# Patient Record
Sex: Male | Born: 1955 | Race: White | Hispanic: No | Marital: Married | Smoking: Never smoker
Health system: Southern US, Community
[De-identification: ages and names within clinical notes are randomized; demographics above are authoritative.]

## PROBLEM LIST (undated history)

## (undated) DIAGNOSIS — I2510 Atherosclerotic heart disease of native coronary artery without angina pectoris: Secondary | ICD-10-CM

## (undated) DIAGNOSIS — I480 Paroxysmal atrial fibrillation: Secondary | ICD-10-CM

## (undated) DIAGNOSIS — E785 Hyperlipidemia, unspecified: Secondary | ICD-10-CM

## (undated) DIAGNOSIS — M1990 Unspecified osteoarthritis, unspecified site: Secondary | ICD-10-CM

## (undated) DIAGNOSIS — K219 Gastro-esophageal reflux disease without esophagitis: Secondary | ICD-10-CM

## (undated) DIAGNOSIS — G4730 Sleep apnea, unspecified: Secondary | ICD-10-CM

## (undated) HISTORY — DX: Paroxysmal atrial fibrillation: I48.0

## (undated) HISTORY — DX: Atherosclerotic heart disease of native coronary artery without angina pectoris: I25.10

## (undated) HISTORY — DX: Sleep apnea, unspecified: G47.30

## (undated) HISTORY — DX: Unspecified osteoarthritis, unspecified site: M19.90

## (undated) HISTORY — DX: Gastro-esophageal reflux disease without esophagitis: K21.9

---

## 2016-01-01 DIAGNOSIS — R0789 Other chest pain: Secondary | ICD-10-CM

## 2016-01-01 DIAGNOSIS — I10 Essential (primary) hypertension: Secondary | ICD-10-CM

## 2016-01-01 DIAGNOSIS — Z8249 Family history of ischemic heart disease and other diseases of the circulatory system: Secondary | ICD-10-CM

## 2019-05-07 ENCOUNTER — Ambulatory Visit (INDEPENDENT_AMBULATORY_CARE_PROVIDER_SITE_OTHER): Payer: 59 | Admitting: Cardiology

## 2019-05-07 ENCOUNTER — Encounter: Payer: Self-pay | Admitting: Cardiology

## 2019-05-07 ENCOUNTER — Other Ambulatory Visit: Payer: Self-pay

## 2019-05-07 ENCOUNTER — Telehealth: Payer: Self-pay | Admitting: Cardiology

## 2019-05-07 VITALS — BP 140/98 | HR 61 | Ht 69.0 in | Wt 166.0 lb

## 2019-05-07 DIAGNOSIS — I10 Essential (primary) hypertension: Secondary | ICD-10-CM

## 2019-05-07 DIAGNOSIS — R55 Syncope and collapse: Secondary | ICD-10-CM

## 2019-05-07 DIAGNOSIS — R079 Chest pain, unspecified: Secondary | ICD-10-CM

## 2019-05-07 DIAGNOSIS — I998 Other disorder of circulatory system: Secondary | ICD-10-CM

## 2019-05-07 DIAGNOSIS — F329 Major depressive disorder, single episode, unspecified: Secondary | ICD-10-CM

## 2019-05-07 DIAGNOSIS — R0789 Other chest pain: Secondary | ICD-10-CM

## 2019-05-07 HISTORY — DX: Chest pain, unspecified: R07.9

## 2019-05-07 HISTORY — DX: Essential (primary) hypertension: I10

## 2019-05-07 NOTE — Telephone Encounter (Signed)
New Message:     Please call Lyman Bishop from [facility] ER. It is concerning Juan Payne.

## 2019-05-07 NOTE — Progress Notes (Signed)
Cardiology Office Note:    Date:  05/07/2019   ID:  MELQUAN ERNSBERGER, DOB August 27, 1955, MRN [id number]  PCP:  Street, Stephanie Coup, MD  Cardiologist:  Thomasene Ripple, DO  Electrophysiologist:  None   Referring MD: Street, Stephanie Coup, *   Chief Complaint  Patient presents with  . New Patient (Initial Visit)    History of Present Illness:    Juan Payne is a 64 y.o. male with a hx of hypertension, significant family history of premature coronary artery disease, dyslipidemia not on any statin due to statin intolerance presents today to be evaluated for chest pain and uncontrolled hypertension.  Patient tells me that over the last several months he has been experiencing significant chest tightness which at first he described as a midsternal tightness that radiates diffusely across his chest.  Recently he states that he experiences these midsternal intermittent chest tightness which lasts for few seconds and then goes leftward and radiates of his shoulder and down his arms.  Of recent he has been experiencing some significant lightheaded and dizzy sensation with this.  He tells me that he had an episode last weekend which prompted his visit to his primary care doctor recently.  During our visit he tells me that he was experiencing chest pain, and then fell back as he noted that the shortness of breath as well as dizziness with the near could be feeling.   History reviewed. No pertinent past medical history.  History reviewed. No pertinent surgical history.  Current Medications: Current Meds  Medication Sig  . aspirin EC 81 MG tablet Take 81 mg by mouth daily.  . carvedilol (COREG) 6.25 MG tablet Take 6.25 mg by mouth 2 (two) times daily.  . citalopram (CELEXA) 20 MG tablet Take 20 mg by mouth daily.  . cloNIDine (CATAPRES) 0.1 MG tablet Take 0.1 mg by mouth 2 (two) times daily.  . hydrOXYzine (ATARAX/VISTARIL) 25 MG tablet Take 25 mg by mouth 3 (three) times daily.  Marland Kitchen  losartan-hydrochlorothiazide (HYZAAR) 100-25 MG tablet Take 1 tablet by mouth daily.     Allergies:   Patient has no known allergies.   Social History   Socioeconomic History  . Marital status: Married    Spouse name: Not on file  . Number of children: Not on file  . Years of education: Not on file  . Highest education level: Not on file  Occupational History  . Not on file  Tobacco Use  . Smoking status: Never Smoker  . Smokeless tobacco: Never Used  Substance and Sexual Activity  . Alcohol use: Not Currently  . Drug use: Never  . Sexual activity: Not on file  Other Topics Concern  . Not on file  Social History Narrative  . Not on file   Social Determinants of Health   Financial Resource Strain:   . Difficulty of Paying Living Expenses:   Food Insecurity:   . Worried About Programme researcher, broadcasting/film/video in the Last Year:   . Barista in the Last Year:   Transportation Needs:   . Freight forwarder (Medical):   Marland Kitchen Lack of Transportation (Non-Medical):   Physical Activity:   . Days of Exercise per Week:   . Minutes of Exercise per Session:   Stress:   . Feeling of Stress :   Social Connections:   . Frequency of Communication with Friends and Family:   . Frequency of Social Gatherings with Friends and Family:   . Attends  Religious Services:   . Active Member of Clubs or Organizations:   . Attends Banker Meetings:   Marland Kitchen Marital Status:      Family History: The patient's family history includes Heart attack in his brother and father; Hypertension in his mother.  ROS:   Review of Systems  Constitution: Negative for decreased appetite, fever and weight gain.  HENT: Negative for congestion, ear discharge, hoarse voice and sore throat.   Eyes: Negative for discharge, redness, vision loss in right eye and visual halos.  Cardiovascular: Negative for chest pain, dyspnea on exertion, leg swelling, orthopnea and palpitations.  Respiratory: Negative for cough,  hemoptysis, shortness of breath and snoring.   Endocrine: Negative for heat intolerance and polyphagia.  Hematologic/Lymphatic: Negative for bleeding problem. Does not bruise/bleed easily.  Skin: Negative for flushing, nail changes, rash and suspicious lesions.  Musculoskeletal: Negative for arthritis, joint pain, muscle cramps, myalgias, neck pain and stiffness.  Gastrointestinal: Negative for abdominal pain, bowel incontinence, diarrhea and excessive appetite.  Genitourinary: Negative for decreased libido, genital sores and incomplete emptying.  Neurological: Negative for brief paralysis, focal weakness, headaches and loss of balance.  Psychiatric/Behavioral: Negative for altered mental status, depression and suicidal ideas.  Allergic/Immunologic: Negative for HIV exposure and persistent infections.    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  Echocardiogram done in December 2017 reports mild left ventricular concentric hypertrophy.  EF 61%.  Diastolic filling pattern indicates impaired relaxation.  Right ventricle is normal in size and function.  Left atrium is normal in size.  The right atrium is normal in size and function.  There is mild lipomatous hypertrophy of the interatrial septum.  No evidence of interatrial communication by color flow Doppler.  The aortic valve is trileaflet.  With mild aortic valve sclerosis good mobility.  No evidence of aortic stenosis.  Trace mitral vegetation.  Tricuspid valve is normal.  Trace tricuspid regurgitation.  Pulmonic valve is normal.  The aortic root, ascending aorta and aortic arch are all normal.  IVC is normal with normal inspiratory collapse.  No pericardial fusion.  Exercise treadmill  stress test at [facility] in 2017 was reported to be normal as he achieved 10 METS.  Recent Labs: No results found for requested labs within last 8760 hours.  Recent Lipid Panel No results found for: CHOL, TRIG, HDL, CHOLHDL, VLDL, LDLCALC, LDLDIRECT  Physical Exam:    VS:  BP (!) 140/98   Pulse 61   Ht 5\' 9"  (1.753 m)   Wt 166 lb (75.3 kg)   SpO2 99%   BMI 24.51 kg/m     Wt Readings from Last 3 Encounters:  05/07/19 166 lb (75.3 kg)     GEN: Well nourished, well developed in no acute distress HEENT: Normal NECK: No JVD; No carotid bruits LYMPHATICS: No lymphadenopathy CARDIAC: S1S2 noted,RRR, no murmurs, rubs, gallops RESPIRATORY:  Clear to auscultation without rales, wheezing or rhonchi  ABDOMEN: Soft, non-tender, non-distended, +bowel sounds, no guarding. EXTREMITIES: No edema, No cyanosis, no clubbing MUSCULOSKELETAL:  No deformity  SKIN: Warm and dry NEUROLOGIC:  Alert and oriented x 3, non-focal PSYCHIATRIC:  Normal affect, good insight  ASSESSMENT:    1. Chest pain   2. Essential hypertension    PLAN:  1.  Chest pain-the patient is currently experiencing chest pain along with significant lightheadedness and dizziness therefore we are going to call EMS transfer the [facility].  I am going to call the ED physician to discuss the case with him.  2.  He is hypertensive I like to start the patient on Aldactone 12.5 mg daily, his target blood pressure should be less than 130/80.  3 hyperlipidemia-the patient is not taking any statin medication at this time.  The patient is in agreement with the above plan. The patient left the office in stable condition.  The patient will follow up in 1 month   Medication Adjustments/Labs and Tests Ordered: Current medicines are reviewed at length with the patient today.  Concerns regarding medicines are outlined above.  No orders of the defined types were placed in this encounter.  No orders of the  defined types were placed in this encounter.   There are no Patient Instructions on file for this visit.   Adopting a Healthy Lifestyle.  Know what a healthy weight is for you (roughly BMI <25) and aim to maintain this   Aim for 7+ servings of fruits and vegetables daily   65-80+ fluid ounces of water or unsweet tea for healthy kidneys   Limit to max 1 drink of alcohol per day; avoid smoking/tobacco   Limit animal fats in diet for cholesterol and heart health - choose grass fed whenever available   Avoid highly processed foods, and foods high in saturated/trans fats   Aim for low stress - take time to unwind and care for your mental health   Aim for 150 min of moderate intensity exercise weekly for heart health, and weights twice weekly for bone health   Aim for 7-9 hours of sleep daily   When it comes to diets, agreement about the perfect plan isnt easy to find, even among the experts. Experts at the [employer] developed an idea known as the Healthy Eating Plate. Just imagine a plate divided into logical, healthy portions.   The emphasis is on diet quality:   Load up on vegetables and fruits - one-half of your plate: Aim for color and variety, and remember that potatoes dont count.   Go for whole grains - one-quarter of your plate: Whole wheat, barley, wheat berries, quinoa, oats, brown rice, and foods made with them. If you want pasta, go with whole wheat pasta.   Protein power - one-quarter of your plate: Fish, chicken, beans, and nuts are all healthy, versatile protein sources. Limit red meat.   The diet, however, does go beyond the plate, offering a few other suggestions.   Use healthy plant oils, such as olive, canola, soy, corn, sunflower and peanut. Check the labels, and avoid partially hydrogenated oil, which have unhealthy trans fats.   If youre thirsty, drink water. Coffee and tea are good in moderation, but skip sugary drinks and limit milk  and dairy products to one or two daily servings.   The type of carbohydrate in the diet is more important than the amount. Some sources of carbohydrates, such as vegetables, fruits, whole grains, and beans-are healthier than others.   Finally, stay active  Signed, Berniece Salines, DO  05/07/2019 9:42 AM    [place]

## 2019-05-07 NOTE — Telephone Encounter (Signed)
I spoke with the ED provider at [facility].

## 2019-05-08 DIAGNOSIS — R55 Syncope and collapse: Secondary | ICD-10-CM | POA: Diagnosis not present

## 2019-05-08 DIAGNOSIS — F329 Major depressive disorder, single episode, unspecified: Secondary | ICD-10-CM | POA: Diagnosis not present

## 2019-05-08 DIAGNOSIS — R079 Chest pain, unspecified: Secondary | ICD-10-CM | POA: Diagnosis not present

## 2019-05-08 DIAGNOSIS — I998 Other disorder of circulatory system: Secondary | ICD-10-CM | POA: Diagnosis not present

## 2019-05-08 DIAGNOSIS — R0789 Other chest pain: Secondary | ICD-10-CM | POA: Diagnosis not present

## 2019-05-09 DIAGNOSIS — F329 Major depressive disorder, single episode, unspecified: Secondary | ICD-10-CM | POA: Diagnosis not present

## 2019-05-09 DIAGNOSIS — R0789 Other chest pain: Secondary | ICD-10-CM | POA: Diagnosis not present

## 2019-05-09 DIAGNOSIS — I998 Other disorder of circulatory system: Secondary | ICD-10-CM | POA: Diagnosis not present

## 2019-05-09 DIAGNOSIS — R55 Syncope and collapse: Secondary | ICD-10-CM | POA: Diagnosis not present

## 2019-05-10 DIAGNOSIS — R55 Syncope and collapse: Secondary | ICD-10-CM | POA: Diagnosis not present

## 2019-05-10 DIAGNOSIS — R0789 Other chest pain: Secondary | ICD-10-CM | POA: Diagnosis not present

## 2019-05-10 DIAGNOSIS — F329 Major depressive disorder, single episode, unspecified: Secondary | ICD-10-CM | POA: Diagnosis not present

## 2019-05-10 DIAGNOSIS — I998 Other disorder of circulatory system: Secondary | ICD-10-CM | POA: Diagnosis not present

## 2019-05-11 ENCOUNTER — Inpatient Hospital Stay (HOSPITAL_COMMUNITY)
Admission: AD | Admit: 2019-05-11 | Discharge: 2019-05-19 | DRG: 234 | Disposition: A | Discharge status: Home / Self Care | Payer: 59 | Source: Other Acute Inpatient Hospital | Attending: Cardiothoracic Surgery | Admitting: Cardiothoracic Surgery

## 2019-05-11 ENCOUNTER — Encounter (HOSPITAL_COMMUNITY): Payer: Self-pay | Admitting: Family Medicine

## 2019-05-11 ENCOUNTER — Other Ambulatory Visit: Payer: Self-pay

## 2019-05-11 DIAGNOSIS — I25110 Atherosclerotic heart disease of native coronary artery with unstable angina pectoris: Secondary | ICD-10-CM | POA: Diagnosis present

## 2019-05-11 DIAGNOSIS — E782 Mixed hyperlipidemia: Secondary | ICD-10-CM | POA: Diagnosis not present

## 2019-05-11 DIAGNOSIS — I10 Essential (primary) hypertension: Secondary | ICD-10-CM | POA: Diagnosis not present

## 2019-05-11 DIAGNOSIS — Z01810 Encounter for preprocedural cardiovascular examination: Secondary | ICD-10-CM

## 2019-05-11 DIAGNOSIS — Z951 Presence of aortocoronary bypass graft: Secondary | ICD-10-CM

## 2019-05-11 DIAGNOSIS — F419 Anxiety disorder, unspecified: Secondary | ICD-10-CM | POA: Diagnosis present

## 2019-05-11 DIAGNOSIS — R55 Syncope and collapse: Secondary | ICD-10-CM

## 2019-05-11 DIAGNOSIS — J90 Pleural effusion, not elsewhere classified: Secondary | ICD-10-CM

## 2019-05-11 DIAGNOSIS — J939 Pneumothorax, unspecified: Secondary | ICD-10-CM

## 2019-05-11 DIAGNOSIS — E785 Hyperlipidemia, unspecified: Secondary | ICD-10-CM | POA: Diagnosis present

## 2019-05-11 DIAGNOSIS — Z531 Procedure and treatment not carried out because of patient's decision for reasons of belief and group pressure: Secondary | ICD-10-CM | POA: Diagnosis not present

## 2019-05-11 DIAGNOSIS — R079 Chest pain, unspecified: Secondary | ICD-10-CM | POA: Diagnosis not present

## 2019-05-11 DIAGNOSIS — I480 Paroxysmal atrial fibrillation: Secondary | ICD-10-CM

## 2019-05-11 DIAGNOSIS — I200 Unstable angina: Secondary | ICD-10-CM

## 2019-05-11 DIAGNOSIS — I2510 Atherosclerotic heart disease of native coronary artery without angina pectoris: Principal | ICD-10-CM

## 2019-05-11 DIAGNOSIS — F329 Major depressive disorder, single episode, unspecified: Secondary | ICD-10-CM | POA: Diagnosis present

## 2019-05-11 DIAGNOSIS — I5032 Chronic diastolic (congestive) heart failure: Secondary | ICD-10-CM | POA: Diagnosis present

## 2019-05-11 DIAGNOSIS — I110 Hypertensive heart disease with heart failure: Secondary | ICD-10-CM | POA: Diagnosis present

## 2019-05-11 DIAGNOSIS — Z20822 Contact with and (suspected) exposure to covid-19: Secondary | ICD-10-CM | POA: Diagnosis present

## 2019-05-11 DIAGNOSIS — I4891 Unspecified atrial fibrillation: Secondary | ICD-10-CM | POA: Diagnosis not present

## 2019-05-11 DIAGNOSIS — IMO0001 Reserved for inherently not codable concepts without codable children: Secondary | ICD-10-CM

## 2019-05-11 DIAGNOSIS — Z7982 Long term (current) use of aspirin: Secondary | ICD-10-CM

## 2019-05-11 DIAGNOSIS — I340 Nonrheumatic mitral (valve) insufficiency: Secondary | ICD-10-CM | POA: Diagnosis not present

## 2019-05-11 DIAGNOSIS — Z79899 Other long term (current) drug therapy: Secondary | ICD-10-CM

## 2019-05-11 DIAGNOSIS — Z8249 Family history of ischemic heart disease and other diseases of the circulatory system: Secondary | ICD-10-CM

## 2019-05-11 DIAGNOSIS — D62 Acute posthemorrhagic anemia: Secondary | ICD-10-CM | POA: Diagnosis not present

## 2019-05-11 HISTORY — DX: Unstable angina: I20.0

## 2019-05-11 HISTORY — DX: Procedure and treatment not carried out because of patient's decision for reasons of belief and group pressure: Z53.1

## 2019-05-11 HISTORY — DX: Syncope and collapse: R55

## 2019-05-11 HISTORY — DX: Hyperlipidemia, unspecified: E78.5

## 2019-05-11 HISTORY — DX: Reserved for inherently not codable concepts without codable children: IMO0001

## 2019-05-11 LAB — BASIC METABOLIC PANEL
Anion gap: 13 (ref 5–15)
BUN: 16 mg/dL (ref 8–23)
CO2: 24 mmol/L (ref 22–32)
Calcium: 9.6 mg/dL (ref 8.9–10.3)
Chloride: 101 mmol/L (ref 98–111)
Creatinine, Ser: 0.83 mg/dL (ref 0.61–1.24)
GFR calc Af Amer: >60 mL/min (ref >60)
GFR calc non Af Amer: >60 mL/min (ref >60)
Glucose, Bld: 103 mg/dL — ABNORMAL HIGH (ref 70–99)
Potassium: 3.8 mmol/L (ref 3.5–5.1)
Sodium: 138 mmol/L (ref 135–145)

## 2019-05-11 LAB — CBC
HCT: 44.7 % (ref 39.0–52.0)
Hemoglobin: 15.6 g/dL (ref 13.0–17.0)
MCH: 32.3 pg (ref 26.0–34.0)
MCHC: 34.9 g/dL (ref 30.0–36.0)
MCV: 92.5 fL (ref 80.0–100.0)
Platelets: 248 10*3/uL (ref 150–400)
RBC: 4.83 MIL/uL (ref 4.22–5.81)
RDW: 11.6 % (ref 11.5–15.5)
WBC: 6.9 10*3/uL (ref 4.0–10.5)
nRBC: 0 % (ref 0.0–0.2)

## 2019-05-11 LAB — HEPATIC FUNCTION PANEL
ALT: 86 U/L — ABNORMAL HIGH (ref 0–44)
AST: 60 U/L — ABNORMAL HIGH (ref 15–41)
Albumin: 4.1 g/dL (ref 3.5–5.0)
Alkaline Phosphatase: 40 U/L (ref 38–126)
Bilirubin, Direct: <0.1 mg/dL (ref 0.0–0.2)
Total Bilirubin: 0.7 mg/dL (ref 0.3–1.2)
Total Protein: 7 g/dL (ref 6.5–8.1)

## 2019-05-11 LAB — MRSA PCR SCREENING: MRSA by PCR: NEGATIVE

## 2019-05-11 LAB — HEPARIN LEVEL (UNFRACTIONATED)
Heparin Unfractionated: 0.75 IU/mL — ABNORMAL HIGH (ref 0.30–0.70)
Heparin Unfractionated: 0.5 IU/mL (ref 0.30–0.70)

## 2019-05-11 LAB — MAGNESIUM: Magnesium: 1.9 mg/dL (ref 1.7–2.4)

## 2019-05-11 LAB — HIV ANTIBODY (ROUTINE TESTING W REFLEX): HIV Screen 4th Generation wRfx: NONREACTIVE

## 2019-05-11 MED ORDER — ONDANSETRON HCL 4 MG/2ML IJ SOLN: 4.0000 mg | Freq: Four times a day (QID) | INTRAMUSCULAR | Status: DC | PRN

## 2019-05-11 MED ORDER — ASPIRIN EC 81 MG PO TBEC
81.0000 mg | DELAYED_RELEASE_TABLET | Freq: Every day | ORAL | Status: DC
  Administered 2019-05-12: 81 mg via ORAL
  Filled 2019-05-11 (×2): qty 1

## 2019-05-11 MED ORDER — ACETAMINOPHEN 325 MG PO TABS: 650.0000 mg | ORAL_TABLET | Freq: Four times a day (QID) | ORAL | Status: DC | PRN

## 2019-05-11 MED ORDER — CARVEDILOL 6.25 MG PO TABS
6.2500 mg | ORAL_TABLET | Freq: Two times a day (BID) | ORAL | Status: DC
  Administered 2019-05-11 (×2): 6.25 mg via ORAL
  Filled 2019-05-11 (×2): qty 1

## 2019-05-11 MED ORDER — ATORVASTATIN CALCIUM 40 MG PO TABS
40.0000 mg | ORAL_TABLET | Freq: Every day | ORAL | Status: DC
  Administered 2019-05-11 – 2019-05-18 (×7): 40 mg via ORAL
  Filled 2019-05-11 (×7): qty 1

## 2019-05-11 MED ORDER — ACETAMINOPHEN 325 MG PO TABS: 650.0000 mg | ORAL_TABLET | ORAL | Status: DC | PRN

## 2019-05-11 MED ORDER — HEPARIN (PORCINE) 25000 UT/250ML-% IV SOLN
1000.0000 [IU]/h | INTRAVENOUS | Status: DC
  Administered 2019-05-11 – 2019-05-12 (×3): 1000 [IU]/h via INTRAVENOUS
  Filled 2019-05-11 (×2): qty 250

## 2019-05-11 MED ORDER — LOSARTAN POTASSIUM 50 MG PO TABS
100.0000 mg | ORAL_TABLET | Freq: Every day | ORAL | Status: DC
  Administered 2019-05-11 – 2019-05-12 (×2): 100 mg via ORAL
  Filled 2019-05-11 (×3): qty 2

## 2019-05-11 MED ORDER — SODIUM CHLORIDE 0.9 % IV SOLN: INTRAVENOUS | Status: DC

## 2019-05-11 NOTE — H&P (Addendum)
History and Physical    Juan Payne:[id number] DOB: 27-Jul-1955 DOA: 05/11/2019  PCP: Venetia Maxon, Sharon Mt, MD   Patient coming from: Home   Chief Complaint: Chest pain, lightheadedness   HPI: Juan Payne is a 64 y.o. male with medical history significant for hypertension and hyperlipidemia, presenting to [facility] emergency department at the direction of his cardiologist for evaluation of chest pain and near syncope.  Patient had been experiencing a vague "tightness" and pressure across his chest intermittently for 3 weeks or so, had a particularly severe episode on 05/03/2019, and occurrences both while at rest and with activity.  He has not noticed any shortness of breath and has not had nausea per se but experiences some lightheadedness and general malaise with some of these episodes.  He has not lost consciousness, but felt as though he might at one point.  There has not been any fevers, chills, leg swelling or tenderness, or cough associated with this.  He denies any headache, change in vision or hearing, or focal numbness or weakness.  Patient saw cardiologist in the clinic on 05/07/2019 for evaluation of the symptoms and was directed to the ED for further evaluation.  [facility] ED and hospital course: Upon arrival to the ED, patient is found to be afebrile, saturating mid 90s, and with stable blood pressure.  EKG featured sinus rhythm with LAD, LVH, and nonspecific IVCD.  Chest x-ray is negative for acute cardiopulmonary disease.  CTA chest was negative for pulmonary embolism or other acute finding.  Chemistry panel and CBC were unremarkable, BNP was normal, and troponin was undetectable.  Head CT was notable for subtle hypodensity at the left frontal white matter.  Patient was given full dose aspirin in the ED.  He was given 1 sublingual nitroglycerin which dropped his systolic blood pressure to less than 70, recovering after a liter of saline.  Patient was admitted  to the hospitalist service, continued to have episodes of chest pain, was started on IV heparin infusion, and cardiology was consulted.  He underwent echocardiogram with normal EF, no regional wall motion abnormalities.  He had a stress test with Lexi scan that was concerning for a small area of mild reversibility involving the distal lateral wall and mid inferolateral wall with normal LV wall motion and normal LVEF.  Cardiologist at [facility] recommended transfer to [facility] for consideration of cardiac catheterization.   Head CT abnormality was follow-up with MRI brain at [facility] that was negative for acute intracranial abnormality.   Review of Systems:  All other systems reviewed and apart from HPI, are negative.  Past Medical History:  Diagnosis Date  . Essential hypertension 05/07/2019  . Hyperlipidemia   . Refusal of blood transfusions as patient is Jehovah's Witness 05/11/2019    History reviewed. No pertinent surgical history.   reports that he has never smoked. He has never used smokeless tobacco. He reports previous alcohol use. He reports that he does not use drugs.  Allergies  Allergen Reactions  . Morphine And Related Other (See Comments)    Drops BP     Family History  Problem Relation Age of Onset  . Hypertension Mother   . Heart attack Father   . Heart attack Brother      Prior to Admission medications   Medication Sig Start Date End Date Taking? Authorizing Provider  aspirin EC 81 MG tablet Take 81 mg by mouth daily.    [provider]  carvedilol (COREG) 6.25  MG tablet Take 6.25 mg by mouth 2 (two) times daily. 05/03/19   [provider]  citalopram (CELEXA) 20 MG tablet Take 20 mg by mouth daily. 05/03/19   [provider]  cloNIDine (CATAPRES) 0.1 MG tablet Take 0.1 mg by mouth 2 (two) times daily. 05/06/19   [provider]  hydrOXYzine (ATARAX/VISTARIL) 25 MG tablet Take 25 mg by mouth 3 (three) times daily. 05/03/19    [provider]  losartan-hydrochlorothiazide (HYZAAR) 100-25 MG tablet Take 1 tablet by mouth daily. 05/03/19   [provider]    Physical Exam: Vitals:   05/11/19 0035  BP: 122/79  Pulse: 60  Resp: 16  Temp: 98.7 F (37.1 C)  TempSrc: Oral  SpO2: 96%  Weight: 73.6 kg  Height: 5\' 9"  (1.753 m)    Constitutional: NAD, calm  Eyes: PERTLA, lids and conjunctivae normal ENMT: Mucous membranes are moist. Posterior pharynx clear of any exudate or lesions.   Neck: normal, supple, no masses, no thyromegaly Respiratory: no wheezing, no crackles. No accessory muscle use.  Cardiovascular: S1 & S2 heard, regular rate and rhythm. No extremity edema.   Abdomen: No distension, no tenderness, soft. Bowel sounds active.  Musculoskeletal: no clubbing / cyanosis. No joint deformity upper and lower extremities.   Skin: no significant rashes, lesions, ulcers. Warm, dry, well-perfused. Neurologic: no facial asymmetry. Sensation intact. Moving all extremities.  Psychiatric: Alert and oriented to person, place, and situation. Pleasant and cooperative.    Labs and Imaging on Admission: I have personally reviewed following labs and imaging studies  CBC: No results for input(s): WBC, NEUTROABS, HGB, HCT, MCV, PLT in the last 168 hours. Basic Metabolic Panel: No results for input(s): NA, K, CL, CO2, GLUCOSE, BUN, CREATININE, CALCIUM, MG, PHOS in the last 168 hours. GFR: CrCl cannot be calculated (No successful lab value found.). Liver Function Tests: No results for input(s): AST, ALT, ALKPHOS, BILITOT, PROT, ALBUMIN in the last 168 hours. No results for input(s): LIPASE, AMYLASE in the last 168 hours. No results for input(s): AMMONIA in the last 168 hours. Coagulation Profile: No results for input(s): INR, PROTIME in the last 168 hours. Cardiac Enzymes: No results for input(s): CKTOTAL, CKMB, CKMBINDEX, TROPONINI in the last 168 hours. BNP (last 3 results) No results for  input(s): PROBNP in the last 8760 hours. HbA1C: No results for input(s): HGBA1C in the last 72 hours. CBG: No results for input(s): GLUCAP in the last 168 hours. Lipid Profile: No results for input(s): CHOL, HDL, LDLCALC, TRIG, CHOLHDL, LDLDIRECT in the last 72 hours. Thyroid Function Tests: No results for input(s): TSH, T4TOTAL, FREET4, T3FREE, THYROIDAB in the last 72 hours. Anemia Panel: No results for input(s): VITAMINB12, FOLATE, FERRITIN, TIBC, IRON, RETICCTPCT in the last 72 hours. Urine analysis: No results found for: COLORURINE, APPEARANCEUR, LABSPEC, PHURINE, GLUCOSEU, HGBUR, BILIRUBINUR, KETONESUR, PROTEINUR, UROBILINOGEN, NITRITE, LEUKOCYTESUR Sepsis Labs: @LABRCNTIP (procalcitonin:4,lacticidven:4) )No results found for this or any previous visit (from the past 240 hour(s)).   Radiological Exams on Admission: No results found.  EKG: Independently reviewed. Sinus rhythm, LAD, LVH, non-specific IVCD.   Assessment/Plan   1. Unstable angina  - Presented to [facility] ED on 4/27 with ~3 wks of intermittent chest pain, becoming more severe, had undetectable troponin, no acute ischemic features on EKG, no PE or other acute finding on CTA chest, echo with preserved EF and no wall-motion abnormality, and Lexiscan with small area of mild reversibility  - He was started on IV heparin at [facility], Lipitor was started,  beta-blocker and ARB continued, and ASA 81 continued  - Cardiologist at [place] recommended transfer to [facility] for consideration of cardiac cath  - Continue IV heparin for now, continue ASA 81, continue statin, continue beta-blocker and ARB   2. Near syncope  - CTA in ED was negative of PE; echo from 05/08/19 with normal EF, no WMA, mild AV sclerosis, and trace-mild MR  - Check orthostatic vitals, continue cardiac monitoring    3. Hypertension  - BP at goal, continue Coreg and losartan     DVT prophylaxis: IV heparin  Code Status: Full  Family Communication:  Discussed with patient  Disposition Plan:  Patient is from: Home  Anticipated d/c is to: Home  Anticipated d/c date is: 05/13/19 Patient currently: Pending cardiology evaluation  Consults called: None  Admission status: Observation     Briscoe Deutscher, MD [facility] Pager: See [email or web address]  If 7AM-7PM, please contact the daytime attending [email or web address]  05/11/2019, 3:50 AM

## 2019-05-11 NOTE — Progress Notes (Signed)
Juan Payne is a 64 y.o. male with medical history significant for hypertension and hyperlipidemia, presenting to [facility] emergency department at the direction of his cardiologist for evaluation of chest pain and near syncope.  Due to concern for ACS was started on IV heparin at [facility].  His cardiologist there recommended transfer to [facility] for possible heart cath.  05/11/19: Seen and examined at his bedside.  States he started feeling better after heparin drip was started.  At the time of this visit patient denies any chest pain, palpitations, dyspnea, dizziness, or GI symptoms.  Twelve-lead EKG done this morning showed: Normal sinus rhythm, left anterior fascicular block.  Cardiology has been consulted.  Please refer to H&P dictated by my partner Dr. Antionette Char  On 05/11/2019 for further details of the assessment and plan.

## 2019-05-11 NOTE — Progress Notes (Signed)
ANTICOAGULATION CONSULT NOTE  Pharmacy Consult for Heparin Indication: chest pain/ACS  Allergies  Allergen Reactions  . Morphine And Related Other (See Comments)    Drops BP     Patient Measurements: Height: 5\' 9"  (175.3 cm) Weight: 73.6 kg (162 lb 4.1 oz) IBW/kg (Calculated) : 70.7 Heparin Dosing Weight: TBW  Vital Signs: Temp: 98.4 F (36.9 C) (05/01 1121) Temp Source: Oral (05/01 1121) BP: 131/88 (05/01 1121) Pulse Rate: 60 (05/01 1121)  Labs: Recent Labs    05/11/19 0454 05/11/19 1223  HGB 15.6  --   HCT 44.7  --   PLT 248  --   HEPARINUNFRC 0.75* 0.50  CREATININE 0.83  --     Estimated Creatinine Clearance: 89.9 mL/min (by C-G formula based on SCr of 0.83 mg/dL).   Medical History: Past Medical History:  Diagnosis Date  . Essential hypertension 05/07/2019  . Hyperlipidemia   . Refusal of blood transfusions as patient is Jehovah's Witness 05/11/2019    Medications:  . heparin 1,000 Units/hr (05/11/19 1116)     Assessment: 64 y.o. M transferred from [place] to [facility] for ACS workup. Pt started on heparin gtt (4000 unit bolus and 1100 units/hr) at [place] - started 4/30 ~1300.  Labs at [place]:  Hgb 17, Hct 49.9, Plt 264, SCr 0.8  Heparin level this afternoon within goal range, no bleeding or complications noted.  CBC, platelet count fairly stable.  Goal of Therapy:  Heparin level 0.3-0.7 units/ml Monitor platelets by anticoagulation protocol: Yes   Plan:  Continue heparin gtt at 1000 units/hr Daily heparin level and CBC F/u plans for cath on Monday.  Friday, [facility] Clinical Pharmacist Phone [phone]  05/11/2019 2:15 PM

## 2019-05-11 NOTE — Consult Note (Addendum)
Cardiology Consult    Patient ID: Juan Payne; [id number]; 1955/05/19   Admit date: 05/11/2019 Date of Consult: 05/11/2019  Primary Care Provider: Street, Stephanie Coup, MD Primary Cardiologist: Thomasene Ripple, DO   Patient Profile    Juan Payne is a 64 y.o. male with past medical history of HTN, HLD (intolerant to statins), Jehovah's witness and family history of premature CAD who is being seen today for the evaluation of chest pain at the request of Dr. Margo Aye.   History of Present Illness    Juan Payne was recently examined by Dr. Servando Salina on 05/07/2019 as a new patient referral for chest pain as he reported having intermittent episodes of chest pain for the past few months which would radiate into his left shoulder and last for seconds at a time. He reported active chest pain at the time of his visit along with dizziness, therefore EMS was called and he was transported to the ED.   Test results from [facility] are reviewed and initial labs showed WBC 6.1, Hgb 17.0, platelets 264, Na+ 140, K+ 4.5 and creatinine 0.80. Troponin values were negative. CTA showed no evidence of a PE but was noted to have coronary artery calcification. CT Head showed subtle hypodensity within the left frontal white matter which may reflect microvascular ischemic changes with no evidence of intracranial hemorrhage or large territory infarction. MRI Head showed no acute intracranial abnormalities but was noted to have chronic small vessel disease. Echocardiogram on 4/27 showed a preserved EF of 55-60% with impaired relaxation. No regional WMA. He did have mild AS and trace to mild MR. Underwent a Lexiscan Myoview on 05/08/2019 and imaging showed a small area of mild reversibility involving the distal lateral wall and mild inferolateral wall with EF at 62%. Was overall read as a low-risk study but given the presenting symptoms and mild reversibility, transfer to [facility] was recommended for consideration of a  catheterization.   Labs upon arrival to [facility] show WBC 6.9, Hgb 15.6, platelets 248, Na+ 138, K+ 3.8 and creatinine 0.83. Mg 1.9. EKG shows NSR, HR 61 with LAD, LAFB and borderline LVH. He has been continued on ASA 81mg  daily, Coreg 6.25mg  BID, Atorvastatin 40mg  daily and Losartan 100mg  daily along with IV Heparin.   In talking with the patient, his his wife and his daughter today, he reports having worsening dizziness with episodic chest pain over the past 2 weeks as outlined above. He was initially to be discharged home on 4/28 but developed recurrent chest pain and reports this resolved with initiation of IV Heparin. He denies any recurrent pain over the past two days. No recent lightheadedness, dizziness or presyncope. No recent orthopnea, PND or lower extremity edema.  He denies any known history of CAD or CHF. Reports his brother had a heart attack around his current age but was a heavy drinker. Also reports his father had heart disease as well.  Past Medical History:  Diagnosis Date  . Essential hypertension 05/07/2019  . Hyperlipidemia   . Refusal of blood transfusions as patient is Jehovah's Witness 05/11/2019    History reviewed. No pertinent surgical history.   Home Medications:  Prior to Admission medications   Medication Sig Start Date End Date Taking? Authorizing Provider  carvedilol (COREG) 6.25 MG tablet Take 6.25 mg by mouth 2 (two) times daily. 05/03/19  Yes [provider]  losartan-hydrochlorothiazide (HYZAAR) 100-25 MG tablet Take 1 tablet by mouth daily. 05/03/19  Yes [provider]  aspirin  EC 81 MG tablet Take 81 mg by mouth daily.    [provider]  citalopram (CELEXA) 20 MG tablet Take 20 mg by mouth daily. 05/03/19   [provider]  cloNIDine (CATAPRES) 0.1 MG tablet Take 0.1 mg by mouth 2 (two) times daily. 05/06/19   [provider]  hydrOXYzine (ATARAX/VISTARIL) 25 MG tablet Take 25 mg by mouth 3 (three) times daily.  05/03/19   [provider]    Inpatient Medications: Scheduled Meds: . [START ON 05/12/2019] aspirin EC  81 mg Oral Daily  . atorvastatin  40 mg Oral q1800  . carvedilol  6.25 mg Oral BID WC  . losartan  100 mg Oral Daily   Continuous Infusions: . heparin 1,000 Units/hr (05/11/19 1116)   PRN Meds: acetaminophen, ondansetron (ZOFRAN) IV  Allergies:    Allergies  Allergen Reactions  . Morphine And Related Other (See Comments)    Drops BP     Social History:   Social History   Socioeconomic History  . Marital status: Married    Spouse name: Not on file  . Number of children: Not on file  . Years of education: Not on file  . Highest education level: Not on file  Occupational History  . Not on file  Tobacco Use  . Smoking status: Never Smoker  . Smokeless tobacco: Never Used  Substance and Sexual Activity  . Alcohol use: Not Currently  . Drug use: Never  . Sexual activity: Not on file  Other Topics Concern  . Not on file  Social History Narrative  . Not on file   Social Determinants of Health   Financial Resource Strain:   . Difficulty of Paying Living Expenses:   Food Insecurity:   . Worried About Programme researcher, broadcasting/film/video in the Last Year:   . Barista in the Last Year:   Transportation Needs:   . Freight forwarder (Medical):   Marland Kitchen Lack of Transportation (Non-Medical):   Physical Activity:   . Days of Exercise per Week:   . Minutes of Exercise per Session:   Stress:   . Feeling of Stress :   Social Connections:   . Frequency of Communication with Friends and Family:   . Frequency of Social Gatherings with Friends and Family:   . Attends Religious Services:   . Active Member of Clubs or Organizations:   . Attends Banker Meetings:   Marland Kitchen Marital Status:   Intimate Partner Violence:   . Fear of Current or Ex-Partner:   . Emotionally Abused:   Marland Kitchen Physically Abused:   . Sexually Abused:      Family History:    Family History   Problem Relation Age of Onset  . Hypertension Mother   . Heart attack Father   . Heart attack Brother       Review of Systems    General:  No chills, fever, night sweats or weight changes.  Cardiovascular:  No dyspnea on exertion, edema, orthopnea, palpitations, paroxysmal nocturnal dyspnea. Positive for chest pain.  Dermatological: No rash, lesions/masses Respiratory: No cough, dyspnea Urologic: No hematuria, dysuria Abdominal:   No nausea, vomiting, diarrhea, bright red blood per rectum, melena, or hematemesis Neurologic:  No visual changes, wkns, changes in mental status. Positive for dizziness.   All other systems reviewed and are otherwise negative except as noted above.  Physical Exam/Data    Vitals:   05/11/19 0620 05/11/19 0726 05/11/19 0900 05/11/19 1121  BP: 121/85 Marland Kitchen)  122/91 129/87 131/88  Pulse: (!) 57 (!) 54 (!) 57 60  Resp: 15 (!) 25 (!) 21 17  Temp:  98.4 F (36.9 C)    TempSrc:  Oral    SpO2: 97% 99% 97% 97%  Weight:      Height:        Intake/Output Summary (Last 24 hours) at 05/11/2019 1218 Last data filed at 05/11/2019 0800 Gross per 24 hour  Intake 0 ml  Output 400 ml  Net -400 ml   Filed Weights   05/11/19 0035 05/11/19 0315  Weight: 73.6 kg 73.6 kg   Body mass index is 23.96 kg/m.   General: Pleasant male appearing in NAD Psych: Normal affect. Neuro: Alert and oriented X 3. Moves all extremities spontaneously. HEENT: Normal  Neck: Supple without bruits or JVD. Lungs:  Resp regular and unlabored, CTA without wheezing or rales. Heart: RRR no s3, s4, or murmurs. Abdomen: Soft, non-tender, non-distended, BS + x 4.  Extremities: No clubbing, cyanosis or lower extremity edema. DP/PT/Radials 2+ and equal bilaterally.   EKG:  The EKG was personally reviewed and demonstrates:  NSR, HR 61 with LAD, LAFB and borderline LVH  Telemetry:  Telemetry was personally reviewed and demonstrates: Sinus bradycardia, HR in mid-40's to 60's.    Labs/Studies      Relevant CV Studies:  Echocardiogram: 05/07/2019 (By review of paper chart)  Preserved EF of 55-60% with impaired relaxation. No regional WMA. He did have mild AS and trace to mild MR.   Lexiscan Myoview: 05/08/2019 (By review of paper chart)  Imaging showed a small area of mild reversibility involving the distal lateral wall and mild inferolateral wall with EF at 62%. Was overall read as a low-risk study.   Laboratory Data:  Chemistry Recent Labs  Lab 05/11/19 0454  NA 138  K 3.8  CL 101  CO2 24  GLUCOSE 103*  BUN 16  CREATININE 0.83  CALCIUM 9.6  GFRNONAA >60  GFRAA >60  ANIONGAP 13    Recent Labs  Lab 05/11/19 0454  PROT 7.0  ALBUMIN 4.1  AST 60*  ALT 86*  ALKPHOS 40  BILITOT 0.7   Hematology Recent Labs  Lab 05/11/19 0454  WBC 6.9  RBC 4.83  HGB 15.6  HCT 44.7  MCV 92.5  MCH 32.3  MCHC 34.9  RDW 11.6  PLT 248   Cardiac EnzymesNo results for input(s): TROPONINI in the last 168 hours. No results for input(s): TROPIPOC in the last 168 hours.  BNPNo results for input(s): BNP, PROBNP in the last 168 hours.  DDimer No results for input(s): DDIMER in the last 168 hours.  Radiology/Studies:  No results found.   Assessment & Plan    1. Chest Pain concerning for Unstable Angina/ Abnormal NST - Was evaluated in the outpatient setting for intermittent chest pain and dizziness for the past 2 to 3 weeks. At [facility], troponin values were negative and EKG without acute ischemic changes. Echocardiogram showed a preserved EF with no regional motion normalities as outlined above. He did have mild AS which will need to be followed as an outpatient. Lexiscan showed mild reversibility involving the distal lateral wall and mild inferolateral wall and given recurrent chest pain, a catheterization was recommended for definitive evaluation.  - The patient understands that risks include but are not limited to stroke (1 in 1000), death (1 in 1000), kidney failure  [usually temporary] (1 in 500), bleeding (1 in 200), allergic reaction [possibly serious] (1 in 200).   -  continue ASA, statin, BB and Heparin. Will add to the cath board for Monday.  2. HTN - BP has been well-controlled at 117/80 - 131/88 within the past 24 hours. He has been continued on Coreg 6.125mg  BID and Losartan 100mg  daily. HR was in the mid-40's overnight and currently in the 50's to 60's which he says is his baseline. Will continue at current dosing for now as he has been on this for many years without reported symptoms. If he develops daytime bradycardia or pauses, would reduce Coreg to 3.125 mg twice daily.  3. HLD - He was listed as having a history of statin intolerance but reports having been on a statin multiple years ago and never tried alternatives. He is unsure what exact medication he was on.  He has been restarted on Atorvastatin 40 mg daily and is tolerating this well. Will check FLP for baseline. Would anticipate repeat FLP and LFT's in 6-8 weeks.   4. Dizziness - CT Head showed subtle hypodensity within the left frontal white matter which may reflect microvascular ischemic changes with no evidence of intracranial hemorrhage or large territory infarction. MRI Head showed no acute intracranial abnormalities but was noted to have chronic small vessel disease. - no plans for further testing at this time. May need to reduce BB dosing as outlined above.    For questions or updates, please contact [facility] Please consult [email or web address] for contact info under Cardiology/STEMI.  Signed, , PA-C 05/11/2019, 12:18 PM Pager: [phone]   The patient was seen, examined and discussed with [phone], PA-C and I agree with the above.    A pleasant 64 y.o. male with past medical history of HTN, HLD (intolerant to statins), Jehovah's witness and family history of premature CAD who is being seen today for the evaluation of chest pain.  The patient is being  followed by Dr. 77 in [place], he was last seen on May 07, 2019 for chest pain that are intermittent, exertional radiating to the left shoulder.  There were also associated episodes of dizziness.  Patient presented to [facility] and was transfer to [facility] for further evaluation and possible cardiac catheterization.  CTA showed no evidence of a PE but was noted to have coronary artery calcification.  MRI Head showed no acute intracranial abnormalities but was noted to have chronic small vessel disease.  Echocardiogram on 4/27 showed a preserved EF of 55-60% with impaired relaxation. No regional WMA. He did have mild AS and trace to mild MR. Underwent a Lexiscan Myoview on 05/08/2019 and imaging showed a small area of mild reversibility involving the distal lateral wall and mild inferolateral wall with EF at 62%. Was overall read as a low-risk study but given the presenting symptoms and mild reversibility, transfer to 05/10/2019 was recommended for consideration of a catheterization.   On physical exam patient is not in acute distress, there are no JVDs, regular rate and rhythm, out of 6 systolic murmur, clear lungs, no lower extremity edema with good peripheral pulses.  EKG shows left anterior fascicular block otherwise LVH and nonspecific ST-T wave abnormalities.  Vitals are stable, will continue aspirin, atorvastatin, carvedilol, losartan and heparin for unstable angina.  We will plan for cardiac catheterization on Monday unless worsening of symptoms.  Denise 0.83, hemoglobin 15.6 and platelets 248.   Friday, MD 05/11/2019

## 2019-05-11 NOTE — Progress Notes (Addendum)
Heparin rate adjusted to 10 units/hr per Pharmacist request.

## 2019-05-11 NOTE — Progress Notes (Addendum)
ANTICOAGULATION CONSULT NOTE - Initial Consult  Pharmacy Consult for Heparin Indication: chest pain/ACS  Allergies  Allergen Reactions  . Morphine And Related Other (See Comments)    Drops BP     Patient Measurements: Height: 5\' 9"  (175.3 cm) Weight: 73.6 kg (162 lb 4.1 oz) IBW/kg (Calculated) : 70.7 Heparin Dosing Weight: TBW  Vital Signs: Temp: 98.7 F (37.1 C) (05/01 0035) Temp Source: Oral (05/01 0035) BP: 122/79 (05/01 0035) Pulse Rate: 60 (05/01 0035)  Labs: No results for input(s): HGB, HCT, PLT, APTT, LABPROT, INR, HEPARINUNFRC, HEPRLOWMOCWT, CREATININE, CKTOTAL, CKMB, TROPONINIHS in the last 72 hours.  CrCl cannot be calculated (No successful lab value found.).   Medical History: History reviewed. No pertinent past medical history.  Medications:  Await electronic med rec  Assessment: 64 y.o. M transferred from [place] to [facility] for ACS workup. Pt started on heparin gtt (4000 unit bolus and 1100 units/hr) at [place] - started 4/30 ~1300.  Labs at [place]:  Hgb 17, Hct 49.9, Plt 264, SCr 0.8  Goal of Therapy:  Heparin level 0.3-0.7 units/ml Monitor platelets by anticoagulation protocol: Yes   Plan:  Continue heparin gtt at 1100 units/hr F/u stat heparin level Daily heparin level and CBC  [place], PharmD, BCPS Please see amion for complete clinical pharmacist phone list 05/11/2019,3:29 AM   Addendum: Heparin level slightly supratherapeutic (0.75) on gtt at 1100 units/hr.   Will decrease gtt to 1000 units/hr and f/u 6 hr heparin level.  07/11/2019, PharmD, BCPS Please see amion for complete clinical pharmacist phone list 05/11/2019 5:44 AM

## 2019-05-11 NOTE — Plan of Care (Signed)

## 2019-05-12 DIAGNOSIS — Z531 Procedure and treatment not carried out because of patient's decision for reasons of belief and group pressure: Secondary | ICD-10-CM | POA: Diagnosis not present

## 2019-05-12 DIAGNOSIS — R079 Chest pain, unspecified: Secondary | ICD-10-CM

## 2019-05-12 DIAGNOSIS — R55 Syncope and collapse: Secondary | ICD-10-CM | POA: Diagnosis not present

## 2019-05-12 DIAGNOSIS — I10 Essential (primary) hypertension: Secondary | ICD-10-CM | POA: Diagnosis not present

## 2019-05-12 LAB — BASIC METABOLIC PANEL
Anion gap: 9 (ref 5–15)
BUN: 14 mg/dL (ref 8–23)
CO2: 26 mmol/L (ref 22–32)
Calcium: 9.2 mg/dL (ref 8.9–10.3)
Chloride: 104 mmol/L (ref 98–111)
Creatinine, Ser: 0.82 mg/dL (ref 0.61–1.24)
GFR calc Af Amer: >60 mL/min (ref >60)
GFR calc non Af Amer: >60 mL/min (ref >60)
Glucose, Bld: 113 mg/dL — ABNORMAL HIGH (ref 70–99)
Potassium: 3.8 mmol/L (ref 3.5–5.1)
Sodium: 139 mmol/L (ref 135–145)

## 2019-05-12 LAB — CBC
HCT: 42.2 % (ref 39.0–52.0)
Hemoglobin: 14.4 g/dL (ref 13.0–17.0)
MCH: 32 pg (ref 26.0–34.0)
MCHC: 34.1 g/dL (ref 30.0–36.0)
MCV: 93.8 fL (ref 80.0–100.0)
Platelets: 213 10*3/uL (ref 150–400)
RBC: 4.5 MIL/uL (ref 4.22–5.81)
RDW: 11.6 % (ref 11.5–15.5)
WBC: 6.6 10*3/uL (ref 4.0–10.5)
nRBC: 0 % (ref 0.0–0.2)

## 2019-05-12 LAB — HEPARIN LEVEL (UNFRACTIONATED): Heparin Unfractionated: 0.61 IU/mL (ref 0.30–0.70)

## 2019-05-12 LAB — LIPID PANEL
Cholesterol: 189 mg/dL (ref 0–200)
HDL: 51 mg/dL (ref >40)
LDL Cholesterol: 111 mg/dL — ABNORMAL HIGH (ref 0–99)
Total CHOL/HDL Ratio: 3.7 RATIO
Triglycerides: 137 mg/dL (ref <150)
VLDL: 27 mg/dL (ref 0–40)

## 2019-05-12 LAB — PHOSPHORUS: Phosphorus: 4.2 mg/dL (ref 2.5–4.6)

## 2019-05-12 LAB — TSH: TSH: 4.403 u[IU]/mL (ref 0.350–4.500)

## 2019-05-12 LAB — MAGNESIUM: Magnesium: 2 mg/dL (ref 1.7–2.4)

## 2019-05-12 MED ORDER — CARVEDILOL 3.125 MG PO TABS
3.1250 mg | ORAL_TABLET | Freq: Two times a day (BID) | ORAL | Status: DC
  Administered 2019-05-12 – 2019-05-13 (×2): 3.125 mg via ORAL
  Filled 2019-05-12 (×2): qty 1

## 2019-05-12 MED ORDER — SODIUM CHLORIDE 0.9 % IV SOLN: 250.0000 mL | INTRAVENOUS | Status: DC | PRN

## 2019-05-12 MED ORDER — LACTATED RINGERS IV SOLN: INTRAVENOUS | Status: DC

## 2019-05-12 MED ORDER — ASPIRIN 81 MG PO CHEW
81.0000 mg | CHEWABLE_TABLET | ORAL | Status: AC
  Administered 2019-05-13: 81 mg via ORAL
  Filled 2019-05-12: qty 1

## 2019-05-12 MED ORDER — SODIUM CHLORIDE 0.9% FLUSH: 3.0000 mL | INTRAVENOUS | Status: DC | PRN

## 2019-05-12 MED ORDER — CITALOPRAM HYDROBROMIDE 20 MG PO TABS
20.0000 mg | ORAL_TABLET | Freq: Every day | ORAL | Status: DC
  Administered 2019-05-12 – 2019-05-18 (×7): 20 mg via ORAL
  Filled 2019-05-12 (×7): qty 1

## 2019-05-12 MED ORDER — SODIUM CHLORIDE 0.9 % WEIGHT BASED INFUSION
3.0000 mL/kg/h | INTRAVENOUS | Status: DC
  Administered 2019-05-13: 3 mL/kg/h via INTRAVENOUS

## 2019-05-12 MED ORDER — SODIUM CHLORIDE 0.9 % WEIGHT BASED INFUSION: 1.0000 mL/kg/h | INTRAVENOUS | Status: DC

## 2019-05-12 MED ORDER — SODIUM CHLORIDE 0.9% FLUSH
3.0000 mL | Freq: Two times a day (BID) | INTRAVENOUS | Status: DC
  Administered 2019-05-12 (×2): 3 mL via INTRAVENOUS

## 2019-05-12 NOTE — Progress Notes (Addendum)
PROGRESS NOTE  DELOREAN KNUTZEN BMW:[id number] DOB: [birth year] DOA: 05/11/2019 PCP: Casper Harrison, Stephanie Coup, MD  HPI/Recap of past 24 hours: Juan Payne Stickleris a 64 y.o.malewith medical history significant forhypertension and hyperlipidemia, presenting to [facility] emergency department at the direction of his cardiologist for evaluation of chest pain and near syncope. Due to concern for ACS was started on IV heparin at [facility].  His cardiologist there recommended transfer to [facility] for possible heart cath.  Reports feeling better right after heparin drip was started.  He has had intermittent chest discomfort lasting seconds.    Seen by cardiology with plan for heart cath on 05/13/2019.  05/12/19: Seen and examined.  No acute events overnight.  Reports intermittent chest discomfort lasting a few seconds.   Assessment/Plan: Active Problems:   Chest pain   Essential hypertension   Near syncope   Unstable angina (HCC)   Refusal of blood transfusions as patient is Jehovah's Witness   Chest pain with concern for unstable angina Prior to admission at [facility] was seen by his cardiologist who recommended transfer to [facility] for possible heart cath due to persistent symptomatology Seen by Dr. Delton See at [facility], planning heart cath on 05/13/2019 Currently on heparin drip, aspirin, statin, beta-blocker Potassium 3.8, magnesium 2.0 2D echo is pending Started on gentle IV fluid LR 50 cc/hr to avoid contrast-induced nephropathy Continue to closely monitor  Resolved sinus bradycardia likely iatrogenic in the setting of BB TSH normal Coreg dose reduced to 3.125 mg twice daily Continue to closely monitor  Hyperlipidemia LDL 111 Continue Lipitor 40 mg daily Repeat lipid panel in 6 to 8 weeks.  Essential hypertension Continue Coreg 3.125 mg twice daily and losartan 100 mg daily  Dizziness/near syncope Obtain orthostatic vital signs Continue fall precaution PT OT to assess after his cardiac cath  or when approved by cardiology  Chronic anxiety/depression Continue Celexa, needing review  Chronic diastolic CHF LVEF 55 to 60% with impaired relaxation from 2D echo done on 05/07/2019 per cardiology's note Continue strict I's and O's and daily weights Closely monitor volume status on IV fluid which will be discontinued after his heart cath.   DVT prophylaxis: IV heparin  Code Status: Full  Family Communication: Discussed with patient  Disposition Plan:  Patient is from: Home  Anticipated d/c is to: Home  Anticipated d/c date is: 05/14/19 Patient currently: Pending heart cath planned on 05/13/19.  Consults called:  Cardiology.    Objective: Vitals:   05/12/19 0552 05/12/19 0722 05/12/19 1105 05/12/19 1109  BP:  136/83 (!) 164/106 (!) 135/98  Pulse:  63 61 64  Resp:  16 17 13   Temp:  98.8 F (37.1 C) 98.6 F (37 C)   TempSrc:  Oral Oral   SpO2:  99% 98% 97%  Weight: 73.1 kg     Height:        Intake/Output Summary (Last 24 hours) at 05/12/2019 1517 Last data filed at 05/12/2019 1141 Gross per 24 hour  Intake 972.19 ml  Output 2450 ml  Net -1477.81 ml   Filed Weights   05/11/19 0035 05/11/19 0315 05/12/19 0552  Weight: 73.6 kg 73.6 kg 73.1 kg    Exam:  . General: 64 y.o. year-old male well developed well nourished in no acute distress.  Alert and oriented x3. . Cardiovascular: Regular rate and rhythm with no rubs or gallops.  No thyromegaly or JVD noted.   77 Respiratory: Clear to auscultation with no wheezes or rales. Good inspiratory effort. . Abdomen: Soft  nontender nondistended with normal bowel sounds x4 quadrants. . Musculoskeletal: No lower extremity edema. 2/4 pulses in all 4 extremities. Marland Kitchen Psychiatry: Mood is appropriate for condition and setting   Data Reviewed: CBC: Recent Labs  Lab 05/11/19 0454 05/12/19 0216  WBC 6.9 6.6  HGB 15.6 14.4  HCT 44.7 42.2  MCV 92.5 93.8  PLT 248 834   Basic Metabolic Panel: Recent Labs  Lab 05/11/19 0454  05/12/19 0216 05/12/19 0659  NA 138 139  --   K 3.8 3.8  --   CL 101 104  --   CO2 24 26  --   GLUCOSE 103* 113*  --   BUN 16 14  --   CREATININE 0.83 0.82  --   CALCIUM 9.6 9.2  --   MG 1.9  --  2.0  PHOS  --   --  4.2   GFR: Estimated Creatinine Clearance: 91 mL/min (by C-G formula based on SCr of 0.82 mg/dL). Liver Function Tests: Recent Labs  Lab 05/11/19 0454  AST 60*  ALT 86*  ALKPHOS 40  BILITOT 0.7  PROT 7.0  ALBUMIN 4.1   No results for input(s): LIPASE, AMYLASE in the last 168 hours. No results for input(s): AMMONIA in the last 168 hours. Coagulation Profile: No results for input(s): INR, PROTIME in the last 168 hours. Cardiac Enzymes: No results for input(s): CKTOTAL, CKMB, CKMBINDEX, TROPONINI in the last 168 hours. BNP (last 3 results) No results for input(s): PROBNP in the last 8760 hours. HbA1C: No results for input(s): HGBA1C in the last 72 hours. CBG: No results for input(s): GLUCAP in the last 168 hours. Lipid Profile: Recent Labs    05/12/19 0216  CHOL 189  HDL 51  LDLCALC 111*  TRIG 137  CHOLHDL 3.7   Thyroid Function Tests: Recent Labs    05/12/19 0659  TSH 4.403   Anemia Panel: No results for input(s): VITAMINB12, FOLATE, FERRITIN, TIBC, IRON, RETICCTPCT in the last 72 hours. Urine analysis: No results found for: COLORURINE, APPEARANCEUR, [place], [place], GLUCOSEU, HGBUR, BILIRUBINUR, KETONESUR, PROTEINUR, UROBILINOGEN, NITRITE, LEUKOCYTESUR Sepsis Labs: @LABRCNTIP (procalcitonin:4,lacticidven:4)  ) Recent Results (from the past 240 hour(s))  MRSA PCR Screening     Status: None   Collection Time: 05/11/19 12:28 AM   Specimen: Nasal Mucosa; Nasopharyngeal  Result Value Ref Range Status   MRSA by PCR NEGATIVE NEGATIVE Final    Comment:        The GeneXpert MRSA Assay (FDA approved for NASAL specimens only), is one component of a comprehensive MRSA colonization surveillance program. It is not intended to diagnose  MRSA infection nor to guide or monitor treatment for MRSA infections. Performed at [facility], [place] [address], [place], [place] [address]       Studies: No results found.  Scheduled Meds: . [START ON 05/13/2019] aspirin  81 mg Oral Pre-Cath  . aspirin EC  81 mg Oral Daily  . atorvastatin  40 mg Oral q1800  . carvedilol  3.125 mg Oral BID WC  . losartan  100 mg Oral Daily  . sodium chloride flush  3 mL Intravenous Q12H    Continuous Infusions: . sodium chloride    . [START ON 05/13/2019] sodium chloride     Followed by  . [START ON 05/13/2019] sodium chloride    . heparin 1,000 Units/hr (05/12/19 1230)  . lactated ringers 50 mL/hr at 05/12/19 1051     LOS: 1 day     Kayleen Memos, MD [facility]  Pager [phone]  If 7PM-7AM, please contact night-coverage [email or web address] Password [facility] 05/12/2019, 3:17 PM

## 2019-05-12 NOTE — Progress Notes (Signed)
Pt ambulated in a hallway without any SOB, chest pain. IV heparin continue @ 1,000 Units/hr. LR continue @50cc /hr, consent taken for surgery tomorrow, pt is aware of NPO status from midnight. Will continue to monitor the patient  , RN

## 2019-05-12 NOTE — Progress Notes (Signed)
ANTICOAGULATION CONSULT NOTE  Pharmacy Consult for Heparin Indication: chest pain/ACS  Allergies  Allergen Reactions  . Morphine And Related Other (See Comments)    Drops BP     Patient Measurements: Height: 5\' 9"  (175.3 cm) Weight: 73.1 kg (161 lb 1.6 oz) IBW/kg (Calculated) : 70.7 Heparin Dosing Weight: TBW  Vital Signs: Temp: 98.8 F (37.1 C) (05/02 0722) Temp Source: Oral (05/02 0722) BP: 136/83 (05/02 0722) Pulse Rate: 63 (05/02 0722)  Labs: Recent Labs    05/11/19 0454 05/11/19 1223 05/12/19 0216  HGB 15.6  --  14.4  HCT 44.7  --  42.2  PLT 248  --  213  HEPARINUNFRC 0.75* 0.50 0.61  CREATININE 0.83  --  0.82    Estimated Creatinine Clearance: 91 mL/min (by C-G formula based on SCr of 0.82 mg/dL).   Medical History: Past Medical History:  Diagnosis Date  . Essential hypertension 05/07/2019  . Hyperlipidemia   . Refusal of blood transfusions as patient is Jehovah's Witness 05/11/2019    Medications:  . heparin 1,000 Units/hr (05/11/19 1116)  . lactated ringers       Assessment: 64 y.o. M transferred from [place] to [facility] for ACS workup. Pt started on heparin gtt (4000 unit bolus and 1100 units/hr) at [place] - started 4/30 ~1300.  Labs at [place]:  Hgb 17, Hct 49.9, Plt 264, SCr 0.8  Heparin level this afternoon within goal range, no bleeding or complications noted.  CBC, platelet count fairly stable.  Goal of Therapy:  Heparin level 0.3-0.7 units/ml Monitor platelets by anticoagulation protocol: Yes   Plan:  Continue heparin gtt at 1000 units/hr Daily heparin level and CBC F/u plans for cath on Monday.  Saturday, [facility] Clinical Pharmacist Phone [phone]  05/12/2019 10:41 AM

## 2019-05-12 NOTE — Progress Notes (Addendum)
Patient heart rate dropped as low as 39 on the monitor, nonsustained. Patient easily to awaken. Alert and oriented x 4. Patient states that he was in a deep sleep. Asymptomatic at this time. Patient has sustained 40s-50s during the night. Manual pulse checked, 52bpm. Will continue to monitor and assess this patient.   Juan Payne- Patient

## 2019-05-12 NOTE — Plan of Care (Signed)
°  Problem: Education: °Goal: Knowledge of General Education information will improve °Description: Including pain rating scale, medication(s)/side effects and non-pharmacologic comfort measures °Outcome: Progressing °  °Problem: Clinical Measurements: °Goal: Diagnostic test results will improve °Outcome: Progressing °  °Problem: Clinical Measurements: °Goal: Ability to maintain clinical measurements within normal limits will improve °Outcome: Progressing °  °

## 2019-05-12 NOTE — Progress Notes (Signed)
Progress Note  Patient Name: Juan Payne Date of Encounter: 05/12/2019  Primary Cardiologist: Thomasene Ripple, DO   Subjective   He has had no recurrent chest pain overnight.  Inpatient Medications    Scheduled Meds: . [START ON 05/13/2019] aspirin  81 mg Oral Pre-Cath  . aspirin EC  81 mg Oral Daily  . atorvastatin  40 mg Oral q1800  . carvedilol  3.125 mg Oral BID WC  . losartan  100 mg Oral Daily  . sodium chloride flush  3 mL Intravenous Q12H   Continuous Infusions: . sodium chloride    . [START ON 05/13/2019] sodium chloride     Followed by  . [START ON 05/13/2019] sodium chloride    . heparin 1,000 Units/hr (05/11/19 1116)  . lactated ringers 50 mL/hr at 05/12/19 1051   PRN Meds: sodium chloride, acetaminophen, ondansetron (ZOFRAN) IV, sodium chloride flush   Vital Signs    Vitals:   05/12/19 0523 05/12/19 0524 05/12/19 0552 05/12/19 0722  BP: 127/87 127/87  136/83  Pulse: (!) 57 (!) 49  63  Resp: 13   16  Temp: 97.6 F (36.4 C)   98.8 F (37.1 C)  TempSrc: Axillary   Oral  SpO2: 99%   99%  Weight:   73.1 kg   Height:        Intake/Output Summary (Last 24 hours) at 05/12/2019 1055 Last data filed at 05/12/2019 5573 Gross per 24 hour  Intake 1012.19 ml  Output 2300 ml  Net -1287.81 ml   Last 3 Weights 05/12/2019 05/11/2019 05/11/2019  Weight (lbs) 161 lb 1.6 oz 162 lb 4.1 oz 162 lb 4.1 oz  Weight (kg) 73.074 kg 73.6 kg 73.6 kg      Telemetry    Sinus bradycardia to sinus rhythm- Personally Reviewed  ECG    SB, new negative T waves in the inferolateral leads - Personally Reviewed  Physical Exam   GEN: No acute distress.   Neck: No JVD Cardiac: RRR, no murmurs, rubs, or gallops.  Respiratory: Clear to auscultation bilaterally. GI: Soft, nontender, non-distended  MS: No edema; No deformity. Neuro:  Nonfocal  Psych: Normal affect   Labs    High Sensitivity Troponin:  No results for input(s): TROPONINIHS in the last 720 hours.    Chemistry Recent  Labs  Lab 05/11/19 0454 05/12/19 0216  NA 138 139  K 3.8 3.8  CL 101 104  CO2 24 26  GLUCOSE 103* 113*  BUN 16 14  CREATININE 0.83 0.82  CALCIUM 9.6 9.2  PROT 7.0  --   ALBUMIN 4.1  --   AST 60*  --   ALT 86*  --   ALKPHOS 40  --   BILITOT 0.7  --   GFRNONAA >60 >60  GFRAA >60 >60  ANIONGAP 13 9     Hematology Recent Labs  Lab 05/11/19 0454 05/12/19 0216  WBC 6.9 6.6  RBC 4.83 4.50  HGB 15.6 14.4  HCT 44.7 42.2  MCV 92.5 93.8  MCH 32.3 32.0  MCHC 34.9 34.1  RDW 11.6 11.6  PLT 248 213    BNPNo results for input(s): BNP, PROBNP in the last 168 hours.   DDimer No results for input(s): DDIMER in the last 168 hours.   Radiology    No results found.  Cardiac Studies     Patient Profile     64 y.o. male with past medical history of HTN, HLD (intolerant to statins), Jehovah's witness and family history  of premature CAD who is being seen today for the evaluation of chest pain.  The patient is being followed by Dr. Harriet Masson in [place], he was last seen on May 07, 2019 for chest pain that are intermittent, exertional radiating to the left shoulder.  There were also associated episodes of dizziness.  Patient presented to [facility] and was transfer to [facility] for further evaluation and possible cardiac catheterization.  CTA showed no evidence of a PE but was noted to have coronary artery calcification.  MRI Head showed no acute intracranial abnormalities but was noted to have chronic small vessel disease.  Echocardiogram on 4/27 showed a preserved EF of 55-60% with impaired relaxation. No regional WMA. He did have mild AS and trace to mild MR. Underwent a Lexiscan Myoview on 05/08/2019 and imaging showed a small area of mild reversibility involving the distal lateral wall and mild inferolateral wall with EF at 62%. Was overall read as a low-risk study but given the presenting symptoms and mild reversibility, transfer to [facility] was recommended for  consideration of a catheterization.   Assessment & Plan    1. Chest Pain concerning for Unstable Angina/ Abnormal NST - cath planned for tomorrow, he understands risk and benefit and agrees -He has new negative T waves in the inferolateral leads on his EKG but he is asymptomatic - continue iv Heparin - continue ASA, statin, BB and Heparin. Will add to the cath board for Monday. -Echocardiogram is pending  2. HTN - BP has been well-controlled  3. HLD - restarted on Atorvastatin 40 mg daily and is tolerating this well. Will check FLP for baseline. Would anticipate repeat FLP and LFT's in 6-8 weeks.   4. Dizziness - CT Head showed subtle hypodensity within the left frontal white matter which may reflect microvascular ischemic changes with no evidence of intracranial hemorrhage or large territory infarction. MRI Head showed no acute intracranial abnormalities but was noted to have chronic small vessel disease. - no plans for further testing at this time. May need to reduce BB dosing as outlined above  For questions or updates, please contact [facility] Please consult [email or web address] for contact info under    Signed, Ena Dawley, MD  05/12/2019, 10:55 AM

## 2019-05-12 NOTE — Progress Notes (Signed)
Heart rate sustaining in 40s at this time. Patient easily awaken, baseline conversation. No reports of pain or distress. Education given concerning Coreg and heart rate/blood pressure.

## 2019-05-13 ENCOUNTER — Inpatient Hospital Stay (HOSPITAL_COMMUNITY): Payer: 59

## 2019-05-13 ENCOUNTER — Other Ambulatory Visit: Payer: Self-pay | Admitting: *Deleted

## 2019-05-13 ENCOUNTER — Encounter (HOSPITAL_COMMUNITY)
Admission: AD | Disposition: A | Discharge status: Home / Self Care | Payer: Self-pay | Source: Other Acute Inpatient Hospital | Attending: Cardiothoracic Surgery

## 2019-05-13 DIAGNOSIS — I25110 Atherosclerotic heart disease of native coronary artery with unstable angina pectoris: Secondary | ICD-10-CM

## 2019-05-13 DIAGNOSIS — I2510 Atherosclerotic heart disease of native coronary artery without angina pectoris: Secondary | ICD-10-CM

## 2019-05-13 DIAGNOSIS — I340 Nonrheumatic mitral (valve) insufficiency: Secondary | ICD-10-CM | POA: Diagnosis not present

## 2019-05-13 DIAGNOSIS — Z01810 Encounter for preprocedural cardiovascular examination: Secondary | ICD-10-CM | POA: Diagnosis not present

## 2019-05-13 DIAGNOSIS — I10 Essential (primary) hypertension: Secondary | ICD-10-CM

## 2019-05-13 DIAGNOSIS — I200 Unstable angina: Secondary | ICD-10-CM | POA: Diagnosis not present

## 2019-05-13 DIAGNOSIS — R55 Syncope and collapse: Secondary | ICD-10-CM | POA: Diagnosis not present

## 2019-05-13 HISTORY — PX: LEFT HEART CATH AND CORONARY ANGIOGRAPHY: CATH118249

## 2019-05-13 LAB — PULMONARY FUNCTION TEST
FEF 25-75 Pre: 2.45 L/sec
FEF2575-%Pred-Pre: 91 %
FEV1-%Pred-Pre: 71 %
FEV1-Pre: 2.38 L
FEV1FVC-%Pred-Pre: 109 %
FEV6-%Pred-Pre: 68 %
FEV6-Pre: 2.9 L
FEV6FVC-%Pred-Pre: 105 %
FVC-%Pred-Pre: 64 %
FVC-Pre: 2.9 L
Pre FEV1/FVC ratio: 82 %
Pre FEV6/FVC Ratio: 100 %

## 2019-05-13 LAB — NO BLOOD PRODUCTS

## 2019-05-13 LAB — URINALYSIS, ROUTINE W REFLEX MICROSCOPIC
Bilirubin Urine: NEGATIVE
Glucose, UA: NEGATIVE mg/dL
Hgb urine dipstick: NEGATIVE
Ketones, ur: NEGATIVE mg/dL
Leukocytes,Ua: NEGATIVE
Nitrite: NEGATIVE
Protein, ur: NEGATIVE mg/dL
Specific Gravity, Urine: 1.014 (ref 1.005–1.030)
pH: 7 (ref 5.0–8.0)

## 2019-05-13 LAB — BASIC METABOLIC PANEL
Anion gap: 6 (ref 5–15)
BUN: 17 mg/dL (ref 8–23)
CO2: 28 mmol/L (ref 22–32)
Calcium: 9 mg/dL (ref 8.9–10.3)
Chloride: 106 mmol/L (ref 98–111)
Creatinine, Ser: 0.92 mg/dL (ref 0.61–1.24)
GFR calc Af Amer: >60 mL/min (ref >60)
GFR calc non Af Amer: >60 mL/min (ref >60)
Glucose, Bld: 111 mg/dL — ABNORMAL HIGH (ref 70–99)
Potassium: 3.6 mmol/L (ref 3.5–5.1)
Sodium: 140 mmol/L (ref 135–145)

## 2019-05-13 LAB — CBC
HCT: 40.6 % (ref 39.0–52.0)
Hemoglobin: 14.2 g/dL (ref 13.0–17.0)
MCH: 32.4 pg (ref 26.0–34.0)
MCHC: 35 g/dL (ref 30.0–36.0)
MCV: 92.7 fL (ref 80.0–100.0)
Platelets: 210 10*3/uL (ref 150–400)
RBC: 4.38 MIL/uL (ref 4.22–5.81)
RDW: 11.5 % (ref 11.5–15.5)
WBC: 6.5 10*3/uL (ref 4.0–10.5)
nRBC: 0 % (ref 0.0–0.2)

## 2019-05-13 LAB — SURGICAL PCR SCREEN
MRSA, PCR: NEGATIVE
Staphylococcus aureus: NEGATIVE

## 2019-05-13 LAB — MAGNESIUM: Magnesium: 2 mg/dL (ref 1.7–2.4)

## 2019-05-13 LAB — ECHOCARDIOGRAM COMPLETE
Height: 69 in
Weight: 2624.36 oz

## 2019-05-13 LAB — HEPARIN LEVEL (UNFRACTIONATED)
Heparin Unfractionated: 0.49 IU/mL (ref 0.30–0.70)
Heparin Unfractionated: 0.17 IU/mL — ABNORMAL LOW (ref 0.30–0.70)

## 2019-05-13 SURGERY — LEFT HEART CATH AND CORONARY ANGIOGRAPHY
Anesthesia: LOCAL

## 2019-05-13 MED ORDER — CHLORHEXIDINE GLUCONATE 4 % EX LIQD
60.0000 mL | Freq: Once | CUTANEOUS | Status: AC
  Administered 2019-05-14: 4 via TOPICAL
  Filled 2019-05-13: qty 60

## 2019-05-13 MED ORDER — MIDAZOLAM HCL 2 MG/2ML IJ SOLN
INTRAMUSCULAR | Status: AC
  Filled 2019-05-13: qty 2

## 2019-05-13 MED ORDER — LABETALOL HCL 5 MG/ML IV SOLN: 10.0000 mg | INTRAVENOUS | Status: AC | PRN

## 2019-05-13 MED ORDER — MAGNESIUM SULFATE 50 % IJ SOLN
40.0000 meq | INTRAMUSCULAR | Status: DC
  Filled 2019-05-13: qty 9.85

## 2019-05-13 MED ORDER — NITROGLYCERIN 1 MG/10 ML FOR IR/CATH LAB
INTRA_ARTERIAL | Status: AC
  Filled 2019-05-13: qty 10

## 2019-05-13 MED ORDER — HEPARIN (PORCINE) 25000 UT/250ML-% IV SOLN
1000.0000 [IU]/h | INTRAVENOUS | Status: DC
  Administered 2019-05-13 (×2): 1000 [IU]/h via INTRAVENOUS
  Filled 2019-05-13: qty 250

## 2019-05-13 MED ORDER — SODIUM CHLORIDE 0.9 % IV SOLN
1.5000 g | INTRAVENOUS | Status: AC
  Administered 2019-05-14: 1.5 g via INTRAVENOUS
  Filled 2019-05-13: qty 1.5

## 2019-05-13 MED ORDER — DIAZEPAM 5 MG PO TABS
5.0000 mg | ORAL_TABLET | Freq: Once | ORAL | Status: AC
  Administered 2019-05-14: 5 mg via ORAL
  Filled 2019-05-13: qty 1

## 2019-05-13 MED ORDER — HEPARIN (PORCINE) IN NACL 1000-0.9 UT/500ML-% IV SOLN
INTRAVENOUS | Status: AC
  Filled 2019-05-13: qty 1000

## 2019-05-13 MED ORDER — ALPRAZOLAM 0.25 MG PO TABS: 0.2500 mg | ORAL_TABLET | ORAL | Status: DC | PRN

## 2019-05-13 MED ORDER — IOHEXOL 350 MG/ML SOLN
INTRAVENOUS | Status: DC | PRN
  Administered 2019-05-13: 80 mL via INTRA_ARTERIAL

## 2019-05-13 MED ORDER — HYDRALAZINE HCL 20 MG/ML IJ SOLN: 10.0000 mg | INTRAMUSCULAR | Status: AC | PRN

## 2019-05-13 MED ORDER — CARVEDILOL 3.125 MG PO TABS
3.1250 mg | ORAL_TABLET | Freq: Once | ORAL | Status: AC
  Administered 2019-05-13: 3.125 mg via ORAL
  Filled 2019-05-13: qty 1

## 2019-05-13 MED ORDER — HEPARIN SODIUM (PORCINE) 1000 UNIT/ML IJ SOLN
INTRAMUSCULAR | Status: DC | PRN
  Administered 2019-05-13: 3500 [IU] via INTRAVENOUS

## 2019-05-13 MED ORDER — LIDOCAINE HCL (PF) 1 % IJ SOLN
INTRAMUSCULAR | Status: AC
  Filled 2019-05-13: qty 30

## 2019-05-13 MED ORDER — INSULIN REGULAR(HUMAN) IN NACL 100-0.9 UT/100ML-% IV SOLN
INTRAVENOUS | Status: AC
  Administered 2019-05-14: .9 [IU]/h via INTRAVENOUS
  Filled 2019-05-13: qty 100

## 2019-05-13 MED ORDER — TRANEXAMIC ACID (OHS) BOLUS VIA INFUSION
15.0000 mg/kg | INTRAVENOUS | Status: AC
  Administered 2019-05-14: 1116 mg via INTRAVENOUS
  Filled 2019-05-13: qty 1116

## 2019-05-13 MED ORDER — SODIUM CHLORIDE 0.9% FLUSH
3.0000 mL | Freq: Two times a day (BID) | INTRAVENOUS | Status: DC
  Administered 2019-05-13 (×2): 3 mL via INTRAVENOUS

## 2019-05-13 MED ORDER — CARVEDILOL 6.25 MG PO TABS
6.2500 mg | ORAL_TABLET | Freq: Two times a day (BID) | ORAL | Status: DC
  Administered 2019-05-13: 6.25 mg via ORAL
  Filled 2019-05-13: qty 1

## 2019-05-13 MED ORDER — PLASMA-LYTE 148 IV SOLN
INTRAVENOUS | Status: DC
  Filled 2019-05-13: qty 2.5

## 2019-05-13 MED ORDER — BISACODYL 5 MG PO TBEC: 5.0000 mg | DELAYED_RELEASE_TABLET | Freq: Once | ORAL | Status: DC

## 2019-05-13 MED ORDER — POTASSIUM CHLORIDE CRYS ER 20 MEQ PO TBCR
40.0000 meq | EXTENDED_RELEASE_TABLET | Freq: Once | ORAL | Status: AC
  Administered 2019-05-13: 40 meq via ORAL
  Filled 2019-05-13: qty 2

## 2019-05-13 MED ORDER — VANCOMYCIN HCL 1250 MG/250ML IV SOLN
1250.0000 mg | INTRAVENOUS | Status: AC
  Administered 2019-05-14: 1250 mg via INTRAVENOUS
  Filled 2019-05-13: qty 250

## 2019-05-13 MED ORDER — METOPROLOL TARTRATE 12.5 MG HALF TABLET
12.5000 mg | ORAL_TABLET | Freq: Once | ORAL | Status: AC
  Administered 2019-05-14: 12.5 mg via ORAL
  Filled 2019-05-13: qty 1

## 2019-05-13 MED ORDER — ACETAMINOPHEN 325 MG PO TABS: 650.0000 mg | ORAL_TABLET | ORAL | Status: DC | PRN

## 2019-05-13 MED ORDER — VERAPAMIL HCL 2.5 MG/ML IV SOLN
INTRA_ARTERIAL | Status: DC | PRN
  Administered 2019-05-13 (×2): 5 mL via INTRA_ARTERIAL

## 2019-05-13 MED ORDER — LIDOCAINE HCL (PF) 1 % IJ SOLN
INTRAMUSCULAR | Status: DC | PRN
  Administered 2019-05-13: 2 mL

## 2019-05-13 MED ORDER — HYDROCHLOROTHIAZIDE 25 MG PO TABS
25.0000 mg | ORAL_TABLET | Freq: Every day | ORAL | Status: DC
  Administered 2019-05-13: 25 mg via ORAL
  Filled 2019-05-13: qty 1

## 2019-05-13 MED ORDER — POTASSIUM CHLORIDE 2 MEQ/ML IV SOLN
80.0000 meq | INTRAVENOUS | Status: DC
  Filled 2019-05-13: qty 40

## 2019-05-13 MED ORDER — DEXMEDETOMIDINE HCL IN NACL 400 MCG/100ML IV SOLN
0.1000 ug/kg/h | INTRAVENOUS | Status: AC
  Administered 2019-05-14: .4 ug/kg/h via INTRAVENOUS
  Filled 2019-05-13: qty 100

## 2019-05-13 MED ORDER — FENTANYL CITRATE (PF) 100 MCG/2ML IJ SOLN
INTRAMUSCULAR | Status: AC
  Filled 2019-05-13: qty 2

## 2019-05-13 MED ORDER — MILRINONE LACTATE IN DEXTROSE 20-5 MG/100ML-% IV SOLN
0.3000 ug/kg/min | INTRAVENOUS | Status: DC
  Filled 2019-05-13 (×2): qty 100

## 2019-05-13 MED ORDER — SODIUM CHLORIDE 0.9% FLUSH: 3.0000 mL | INTRAVENOUS | Status: DC | PRN

## 2019-05-13 MED ORDER — NITROGLYCERIN IN D5W 200-5 MCG/ML-% IV SOLN
2.0000 ug/min | INTRAVENOUS | Status: AC
  Administered 2019-05-14: 7 ug/min via INTRAVENOUS
  Filled 2019-05-13: qty 250

## 2019-05-13 MED ORDER — NOREPINEPHRINE 4 MG/250ML-% IV SOLN
0.0000 ug/min | INTRAVENOUS | Status: DC
  Filled 2019-05-13: qty 250

## 2019-05-13 MED ORDER — VERAPAMIL HCL 2.5 MG/ML IV SOLN
INTRAVENOUS | Status: AC
  Filled 2019-05-13: qty 2

## 2019-05-13 MED ORDER — CHLORHEXIDINE GLUCONATE 4 % EX LIQD
60.0000 mL | Freq: Once | CUTANEOUS | Status: AC
  Administered 2019-05-13: 4 via TOPICAL
  Filled 2019-05-13: qty 60

## 2019-05-13 MED ORDER — PHENYLEPHRINE HCL-NACL 20-0.9 MG/250ML-% IV SOLN
30.0000 ug/min | INTRAVENOUS | Status: AC
  Administered 2019-05-14: 25 ug/min via INTRAVENOUS
  Filled 2019-05-13: qty 250

## 2019-05-13 MED ORDER — EPINEPHRINE HCL 5 MG/250ML IV SOLN IN NS
0.0000 ug/min | INTRAVENOUS | Status: DC
  Filled 2019-05-13: qty 250

## 2019-05-13 MED ORDER — HEPARIN SODIUM (PORCINE) 1000 UNIT/ML IJ SOLN
INTRAMUSCULAR | Status: AC
  Filled 2019-05-13: qty 1

## 2019-05-13 MED ORDER — SODIUM CHLORIDE 0.9 % IV SOLN: 250.0000 mL | INTRAVENOUS | Status: DC | PRN

## 2019-05-13 MED ORDER — SODIUM CHLORIDE 0.9 % IV SOLN: INTRAVENOUS | Status: AC

## 2019-05-13 MED ORDER — TRANEXAMIC ACID (OHS) PUMP PRIME SOLUTION
2.0000 mg/kg | INTRAVENOUS | Status: DC
  Filled 2019-05-13: qty 1.49

## 2019-05-13 MED ORDER — MIDAZOLAM HCL 2 MG/2ML IJ SOLN
INTRAMUSCULAR | Status: DC | PRN
  Administered 2019-05-13: 1 mg via INTRAVENOUS

## 2019-05-13 MED ORDER — SODIUM CHLORIDE 0.9 % IV SOLN
750.0000 mg | INTRAVENOUS | Status: AC
  Administered 2019-05-14: 750 mg via INTRAVENOUS
  Filled 2019-05-13: qty 750

## 2019-05-13 MED ORDER — FENTANYL CITRATE (PF) 100 MCG/2ML IJ SOLN
INTRAMUSCULAR | Status: DC | PRN
  Administered 2019-05-13: 25 ug via INTRAVENOUS

## 2019-05-13 MED ORDER — ONDANSETRON HCL 4 MG/2ML IJ SOLN: 4.0000 mg | Freq: Four times a day (QID) | INTRAMUSCULAR | Status: DC | PRN

## 2019-05-13 MED ORDER — LOSARTAN POTASSIUM-HCTZ 100-25 MG PO TABS: 1.0000 | ORAL_TABLET | Freq: Every day | ORAL | Status: DC

## 2019-05-13 MED ORDER — CHLORHEXIDINE GLUCONATE 0.12 % MT SOLN
15.0000 mL | Freq: Once | OROMUCOSAL | Status: AC
  Administered 2019-05-14: 15 mL via OROMUCOSAL
  Filled 2019-05-13: qty 15

## 2019-05-13 MED ORDER — TEMAZEPAM 15 MG PO CAPS
15.0000 mg | ORAL_CAPSULE | Freq: Once | ORAL | Status: AC | PRN
  Administered 2019-05-13: 15 mg via ORAL
  Filled 2019-05-13: qty 1

## 2019-05-13 MED ORDER — TRANEXAMIC ACID 1000 MG/10ML IV SOLN
1.5000 mg/kg/h | INTRAVENOUS | Status: AC
  Administered 2019-05-14: 1.5 mg/kg/h via INTRAVENOUS
  Filled 2019-05-13: qty 25

## 2019-05-13 MED ORDER — HEPARIN (PORCINE) IN NACL 1000-0.9 UT/500ML-% IV SOLN
INTRAVENOUS | Status: DC | PRN
  Administered 2019-05-13 (×2): 500 mL

## 2019-05-13 MED ORDER — ASPIRIN 81 MG PO CHEW: 81.0000 mg | CHEWABLE_TABLET | Freq: Every day | ORAL | Status: DC

## 2019-05-13 MED ORDER — SODIUM CHLORIDE 0.9 % IV SOLN
INTRAVENOUS | Status: DC
  Filled 2019-05-13: qty 30

## 2019-05-13 SURGICAL SUPPLY — 12 items
CATH INFINITI 5FR ANG PIGTAIL (CATHETERS) ×1 IMPLANT
CATH OPTITORQUE TIG 4.0 5F (CATHETERS) ×1 IMPLANT
DEVICE RAD COMP TR BAND LRG (VASCULAR PRODUCTS) ×1 IMPLANT
GLIDESHEATH SLEND A-KIT 6F 22G (SHEATH) ×1 IMPLANT
GUIDEWIRE INQWIRE 1.5J.035X260 (WIRE) IMPLANT
INQWIRE 1.5J .035X260CM (WIRE) ×2
KIT HEART LEFT (KITS) ×2 IMPLANT
PACK CARDIAC CATHETERIZATION (CUSTOM PROCEDURE TRAY) ×2 IMPLANT
SYR MEDRAD MARK 7 150ML (SYRINGE) ×2 IMPLANT
TRANSDUCER W/STOPCOCK (MISCELLANEOUS) ×2 IMPLANT
TUBING CIL FLEX 10 FLL-RA (TUBING) ×2 IMPLANT
WIRE HI TORQ VERSACORE-J 145CM (WIRE) ×1 IMPLANT

## 2019-05-13 NOTE — Progress Notes (Signed)
ANTICOAGULATION CONSULT NOTE - Follow Up Consult  Pharmacy Consult for heparin Indication: CAD awaiting CABG  Labs: Recent Labs    05/11/19 0454 05/11/19 1223 05/12/19 0216 05/13/19 0225 05/13/19 2229  HGB 15.6  --  14.4 14.2  --   HCT 44.7  --  42.2 40.6  --   PLT 248  --  213 210  --   HEPARINUNFRC 0.75*   < > 0.61 0.49 0.17*  CREATININE 0.83  --  0.82 0.92  --    < > = values in this interval not displayed.    Assessment/Plan:  64yo male subtherapeutic on heparin after resumed post-cath but was previously therapeutic at this rate and likely needs more time for accumulation. Will continue gtt at current rate for now and check level with am labs.   Vernard Gambles, PharmD, BCPS  05/13/2019,11:54 PM

## 2019-05-13 NOTE — Plan of Care (Signed)

## 2019-05-13 NOTE — Consult Note (Addendum)
[address]       [place] [address]             [phone]        Juan Payne [facility] Medical Record #[id number] Date of Birth: 09/19/1955  Referring: Dr. Allyson Sabal, MD Primary Care: Street, Stephanie Coup, MD Primary Cardiologist:Kardie Tobb, DO  Chief Complaint:   Chest pain and light headeness Reason for consultation: Coronary artery disease  History of Present Illness:     This is a 64 year old male with a past medical history of hypertension and hyperlipidemia who is a Jevoha's Witness and refuses blood transfusions who initially presented to [facility] Emergency Department on 05/07/2019, after being evaluated at his cardiologist's office for chest pain/pressure (like 2 hands pushing on his chest), lightheadedness (flush like with near passing out feeling), and dizziness.  Patient has been experiencing chest pain, at rest and with activity, intermittently for about 3 weeks. He also has complaints of general malaise, but denied diaphoresis, nausea, or syncope. He did have at least one episode where the pain radiated down his left arm. Work up at [employer] included EKG which showed normal sinus rhythm with LAD, left anterior fascicular block, and LVH, labs near normal or normal, CTA which was negative for PE or other acute findng, CT of the head showed hypodensity within the left frontal white matter which may reflect microvascular ischemic changes with no evidence of intracranial hemorrhage or large territory infarction. MRI Head showed no acute intracranial abnormalities but was noted to have chronic small vessel disease. Patient was given Nitro, put on Heparin drip, and cardiology was consulted. Echo showed LVEF 55-60%, no regional wall motion abnormalities, mild AS and trace to mild MR. Lexiscan Myoview done on 04/28 that was concerning for a small area of mild reversibility involving the distal lateral wall and mid inferolateral wall with normal LV wall motion  and normal LVEF. It was recommended that patient be transferred to [facility] for consideration of cardiac catheterization. Dr. Allyson Sabal did a cardiac catheterization earlier today and results showed proximal to mid LAD 89% stenosed, OM 2 90% stenosed, and OM3 95 and 99% stenoses with LVEF 55-65%. A cardiothoracic consultation has been requested with Dr. Donata Clay. At the time of exam, patient denies chest pressure or pain, shortness of breath, or dizziness.  Current Activity/ Functional Status: Patient is independent with mobility/ambulation, transfers, ADL's, IADL's.   Zubrod Score: At the time of surgery this patient's most appropriate activity status/level should be described as: []     0    Normal activity, no symptoms [x]     1    Restricted in physical strenuous activity but ambulatory, able to do out light work []     2    Ambulatory and capable of self care, unable to do work activities, up and about more than 50%  Of the time                            []     3    Only limited self care, in bed greater than 50% of waking hours []     4    Completely disabled, no self care, confined to bed or chair []     5    Moribund  Past Medical History:  Diagnosis Date  . Essential hypertension 05/07/2019  . Hyperlipidemia   . Refusal of blood transfusions as patient is  Witness 05/11/2019    Past Surgical History: Pilonidal cyst and lipoma  Social History   Tobacco Use  Smoking Status Never Smoker  Smokeless Tobacco Never Used    Social History   Substance and Sexual Activity  Alcohol Use Not Currently  Patient is a Curator   Allergies  Allergen Reactions  . Morphine And Related Other (See Comments)    Drops BP     Current Facility-Administered Medications  Medication Dose Route Frequency Provider Last Rate Last Admin  . 0.9 %  sodium chloride infusion   Intravenous Continuous Runell Gess, MD      . 0.9 %  sodium chloride infusion  250 mL Intravenous PRN  Runell Gess, MD      . acetaminophen (TYLENOL) tablet 650 mg  650 mg Oral Q4H PRN Runell Gess, MD      . aspirin EC tablet 81 mg  81 mg Oral Daily Runell Gess, MD   81 mg at 05/12/19 0835  . atorvastatin (LIPITOR) tablet 40 mg  40 mg Oral q1800 Runell Gess, MD   40 mg at 05/12/19 1715  . carvedilol (COREG) tablet 6.25 mg  6.25 mg Oral BID Runell Gess, MD      . citalopram (CELEXA) tablet 20 mg  20 mg Oral QHS Runell Gess, MD   20 mg at 05/12/19 2225  . hydrALAZINE (APRESOLINE) injection 10 mg  10 mg Intravenous Q20 Min PRN Runell Gess, MD      . hydrochlorothiazide (HYDRODIURIL) tablet 25 mg  25 mg Oral Daily Chilton Si, MD   25 mg at 05/13/19 1248  . labetalol (NORMODYNE) injection 10 mg  10 mg Intravenous Q10 min PRN Runell Gess, MD      . losartan (COZAAR) tablet 100 mg  100 mg Oral Daily Runell Gess, MD   100 mg at 05/12/19 0834  . ondansetron (ZOFRAN) injection 4 mg  4 mg Intravenous Q6H PRN Runell Gess, MD      . sodium chloride flush (NS) 0.9 % injection 3 mL  3 mL Intravenous Q12H Runell Gess, MD      . sodium chloride flush (NS) 0.9 % injection 3 mL  3 mL Intravenous PRN Runell Gess, MD        Medications Prior to Admission  Medication Sig Dispense Refill Last Dose  . aspirin EC 81 MG tablet Take 81 mg by mouth daily.   05/10/2019 at Unknown time  . carvedilol (COREG) 6.25 MG tablet Take 6.25 mg by mouth 2 (two) times daily.   05/11/2019 at 1007  . citalopram (CELEXA) 20 MG tablet Take 20 mg by mouth at bedtime.    05/10/2019 at Unknown time  . hydrOXYzine (ATARAX/VISTARIL) 25 MG tablet Take 25 mg by mouth 3 (three) times daily.   05/10/2019 at Unknown time  . losartan-hydrochlorothiazide (HYZAAR) 100-25 MG tablet Take 1 tablet by mouth daily.   05/11/2019 at 1007  . cloNIDine (CATAPRES) 0.1 MG tablet Take 0.1 mg by mouth 2 (two) times daily.       Family History  Problem Relation Age of Onset  . Hypertension  Mother is alive at age 30   . Heart attack Father. Just died on the 51th at age 51 after a fall and broken hip   . Heart attack Brother   Review of Systems:      Cardiac Review of Systems: Y or  [ N   ]= no  Chest Pain [ Y-on admission   ]  Resting SOB [  N ] Exertional SOB  [  ]  Orthopnea [ N ]   Pedal Edema [  N ]    Syncope  Klaus.Mock[N  ]  Presyncope [ Y  ]   General Review of Systems: [Y] = yes [N  ]=no Constitional:anorexia [  ]; fatigue [ Y ]; nausea [ N ]; night sweats Klaus.Mock[N  ]; fever Klaus.Mock[N  ]; or chills Klaus.Mock[N  ]                                                              Eye :  vision changes Klaus.Mock[N  ];  Amaurosis fugax[ N ]; Resp: cough [ N ];  wheezing[ N ];  hemoptysis[ N ];  GI:  vomiting[ N ];  dysphagia[ N ]; melena[ N ];  hematochezia Klaus.Mock[N  ]; GU:  hematuria[ N ];                Skin: rash, swelling[N  ];  Musculosketetal:  back pain[ N ];  Heme/Lymph: anemia[N  ];  Neuro: Deyanira.KussmaulTIA[N  ];  stroke[N  ];  vertigo[ N ];  seizures[N  ];    difficulty walking[N  ];   Endocrine: diabetes[ N ];  thyroid dysfunction[N  ];               Physical Exam: BP 130/76   Pulse 62   Temp 98.4 F (36.9 C) (Oral)   Resp 17   Ht 5\' 9"  (1.753 m)   Wt 74.4 kg   SpO2 94%   BMI 24.22 kg/m    General appearance: alert, cooperative and no distress Head: Normocephalic, without obvious abnormality, atraumatic Neck: no carotid bruit, no JVD and supple, symmetrical, trachea midline Resp: clear to auscultation bilaterally Cardio: RRR, no murmur GI: Soft, non tender, bowel sounds present Extremities: No cyanosis or LE edema. Palpable DP/PT pulses Neurologic: Grossly normal  Diagnostic Studies & Laboratory data:     Recent Radiology Findings:   CARDIAC CATHETERIZATION  Result Date: 05/13/2019  2nd Mrg lesion is 90% stenosed.  3rd Mrg-1 lesion is 99% stenosed.  3rd Mrg-2 lesion is 95% stenosed.  Prox LAD to Mid LAD lesion is 90% stenosed.  Ost LAD to Prox LAD lesion is 60% stenosed.  The left ventricular  systolic function is normal.  LV end diastolic pressure is normal.  The left ventricular ejection fraction is 55-65% by visual estimate.  Jeanine LuzDana M Callens is a 64 y.o. male  [id number] LOCATION:  FACILITY: [facility] PHYSICIAN: Nanetta BattyJonathan Berry, M.D. 04-Oct-1955 DATE OF PROCEDURE:  05/13/2019 DATE OF DISCHARGE: CARDIAC CATHETERIZATION History obtained from chart review.64 y.o. male with hypertension, hyperlipidemia, Jehovah's Witness, and family history of CAD here with unstable angina.   Mr. Rosalyn GessStickler has left dominant system with severe calcified vessels.  His entire proximal third of his LAD is calcified with diffuse 50 to 60% stenosis and 90% stenosis within this.  He had a high-grade small to medium size second marginal branch stenosis and a high-grade large ostial proximal and mid third obtuse marginal branch stenosis with normal LV function.  I believe the best revascularization strategy would be complete revascularization.  He is not diabetic would be a good candidate for all all  arterial conduit procedure if possible.  The sheath was removed and a TR band was placed on the right wrist to achieve patent hemostasis.  The patient left lab in stable condition.  He will be reheparinized 4 hours after sheath removal.  T CTS has been notified. Quay Burow. MD, [facility] 05/13/2019 10:44 AM     I have independently reviewed the above radiologic studies and discussed with the patient   Recent Lab Findings: Lab Results  Component Value Date   WBC 6.5 05/13/2019   HGB 14.2 05/13/2019   HCT 40.6 05/13/2019   PLT 210 05/13/2019   GLUCOSE 111 (H) 05/13/2019   CHOL 189 05/12/2019   TRIG 137 05/12/2019   HDL 51 05/12/2019   LDLCALC 111 (H) 05/12/2019   ALT 86 (H) 05/11/2019   AST 60 (H) 05/11/2019   NA 140 05/13/2019   K 3.6 05/13/2019   CL 106 05/13/2019   CREATININE 0.92 05/13/2019   BUN 17 05/13/2019   CO2 28 05/13/2019   TSH 4.403 05/12/2019   Assessment / Plan:   1. Coronary artery disease-patient would  benefit from coronary artery bypass grafting surgery. Dr. Prescott Gum to evaluate. Of note, patient is a Jehova's witness and does not want platelets, WBC, RBC, or whole blood. 2. History of hypertension-on HCTZ 25 mg daily, Losartan 100 mg daily, Carvedilol 6.25 mg bid 3. History of hyperlipidemia-on Atorvastatin 40 mg at hs. Patient has had myalgias with a different statin in the past so will closely monitor to see if he is able to tolerate this. 4. He may have undiagnosed OSA so he will need sleep study at some point after discharge  I  spent 20 minutes counseling the patient face to face.   Lars Pinks PA-C 05/13/2019 1:00 PM   Patient examined, images of coronary angiogram personally reviewed and discussed with patient and family.  Very nice 63 year old male with hypertension, dyslipidemia, member of Jehovah's Witness, and with positive family history of coronary artery disease.  The patient presented to [facility] with symptoms of unstable angina and positive troponin.  An echocardiogram showed well-preserved biventricular function without significant valvular disease or pericardial effusion.  A CT scan of the chest by report showed no pulmonary embolus, significant pulmonary disease or aortic disease.  There was coronary calcification noted.  The patient had a head CT and brain MRI which showed some small vessel disease.  He was transferred to this hospital and underwent left and right heart cath today by Dr. Gwenlyn Found.  This demonstrated a left dominant coronary circulation with high-grade proximal 90% LAD stenosis and a ostial 95% stenosis of a large obtuse marginal.  The nondominant RCA is small without significant disease.  A more proximal small circumflex marginal has 80% stenosis.  The patient has been stable on IV heparin.  His last hemoglobin was 14.2 g.  He declines to have blood transfusion or blood products but will except cardiopulmonary bypass, plasma expander such as albumin,  and medications to improve coagulation after cardiopulmonary bypass ordered from the pharmacy but  not from the blood bank.  The patient is right-hand dominant.  There is no history of diabetes.  No history of smoking.  On exam the patient is comfortable and well developed. He is in sinus rhythm without cardiac murmur or gallop. He has no carotid bruit or JVD. Peripheral pulses are palpable and there is no evidence of venous insufficiency of the lower extremities.  Patient would benefit from multivessel arterial bypass grafts to  the LAD and large obtuse marginal.  We will graft the smaller circumflex marginal if significant. I have discussed the procedure of left radial artery harvest, sternotomy, and endoscopic saphenous vein harvest with the patient.  He understands the expected benefits of the surgery to include improved survival, relief of symptoms of angina, and preservation of LV function.  He understands the risks of bleeding and would not wish to have blood product transfusion under any circumstance.  He also understands the risk of infection stroke and organ failure.  We will proceed with CABG in a.m.  All questions have been addressed to the patient and family.

## 2019-05-13 NOTE — Progress Notes (Signed)
Pre-CABG has been completed.   Preliminary results in CV Proc.   Blanch Media 05/13/2019 3:40 PM

## 2019-05-13 NOTE — Anesthesia Preprocedure Evaluation (Addendum)
Anesthesia Evaluation  Patient identified by MRN, date of birth, ID band Patient awake    Reviewed: Allergy & Precautions, NPO status , Patient's Chart, lab work & pertinent test results, reviewed documented beta blocker date and time   Airway Mallampati: II  TM Distance: >3 FB Neck ROM: Full    Dental  (+) Teeth Intact, Chipped, Dental Advisory Given   Pulmonary neg pulmonary ROS,    Pulmonary exam normal breath sounds clear to auscultation       Cardiovascular hypertension, Pt. on medications and Pt. on home beta blockers + angina with exertion + CAD  Normal cardiovascular exam Rhythm:Regular Rate:Normal  Echo 05/13/2019 1. Left ventricular ejection fraction, by estimation, is 45 to 50%. The left ventricle has mildly decreased function. The left ventricle demonstrates regional wall motion abnormalities (see scoring diagram/findings for description). Left ventricular diastolic parameters are consistent with Grade I diastolic dysfunction (impaired relaxation).  2. Right ventricular systolic function is normal. The right ventricular size is normal.  3. The mitral valve is normal in structure. Mild mitral valve  regurgitation. No evidence of mitral stenosis.  4. The aortic valve is tricuspid. Aortic valve regurgitation is not visualized. No aortic stenosis is present.  5. Aortic dilatation noted. 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  6. The inferior vena cava is normal in size with greater than 50% respiratory variability, suggesting right atrial pressure of 3 mmHg.    Neuro/Psych negative neurological ROS  negative psych ROS   GI/Hepatic negative GI ROS, Neg liver ROS,   Endo/Other  negative endocrine ROS  Renal/GU negative Renal ROS     Musculoskeletal negative musculoskeletal ROS (+)   Abdominal   Peds  Hematology negative hematology ROS (+)   Anesthesia Other Findings    Reproductive/Obstetrics                            Anesthesia Physical Anesthesia Plan  ASA: IV  Anesthesia Plan: General   Post-op Pain Management:    Induction: Intravenous  PONV Risk Score and Plan: 3 and Midazolam and Treatment may vary due to age or medical condition  Airway Management Planned: Oral ETT  Additional Equipment: Arterial line, CVP, PA Cath, TEE and Ultrasound Guidance Line Placement  Intra-op Plan: Utilization Of Total Body Hypothermia per surgeon request  Post-operative Plan: Post-operative intubation/ventilation  Informed Consent: I have reviewed the patients History and Physical, chart, labs and discussed the procedure including the risks, benefits and alternatives for the proposed anesthesia with the patient or authorized representative who has indicated his/her understanding and acceptance.     Dental advisory given  Plan Discussed with: CRNA  Anesthesia Plan Comments:         Anesthesia Quick Evaluation

## 2019-05-13 NOTE — Progress Notes (Signed)
TCTS consulted for CABG evaluation. °

## 2019-05-13 NOTE — Progress Notes (Signed)
 Progress Note  Patient Name: Juan Payne Date of Encounter: 05/13/2019  Primary Cardiologist: Kardie Tobb, DO   Subjective   Feeling well.  Minimal chest pain since starting heparin.  He does have exertional dyspnea.   Inpatient Medications    Scheduled Meds: . aspirin EC  81 mg Oral Daily  . atorvastatin  40 mg Oral q1800  . carvedilol  3.125 mg Oral BID WC  . citalopram  20 mg Oral QHS  . losartan  100 mg Oral Daily  . sodium chloride flush  3 mL Intravenous Q12H   Continuous Infusions: . sodium chloride    . sodium chloride 1 mL/kg/hr (05/13/19 0518)  . heparin 1,000 Units/hr (05/12/19 1230)  . lactated ringers 50 mL/hr at 05/12/19 1051   PRN Meds: sodium chloride, acetaminophen, ondansetron (ZOFRAN) IV, sodium chloride flush   Vital Signs    Vitals:   05/13/19 0359 05/13/19 0725 05/13/19 0744 05/13/19 0745  BP: 137/88  (!) 148/99   Pulse: 64     Resp: 13 16 13 16  Temp: 97.6 F (36.4 C)  98.2 F (36.8 C)   TempSrc: Oral  Oral   SpO2: 98%     Weight: 74.4 kg     Height:        Intake/Output Summary (Last 24 hours) at 05/13/2019 0924 Last data filed at 05/13/2019 0401 Gross per 24 hour  Intake 210 ml  Output 1500 ml  Net -1290 ml   Last 3 Weights 05/13/2019 05/12/2019 05/11/2019  Weight (lbs) 164 lb 0.4 oz 161 lb 1.6 oz 162 lb 4.1 oz  Weight (kg) 74.4 kg 73.074 kg 73.6 kg      Telemetry    Sinus rhythm/sinus bradycardia.  Bradycardia to the 40s overnight.- Personally Reviewed  ECG    05/12/2019: Sinus bradycardia.  Rate 51 bpm.  LAFB.  LVH. - Personally Reviewed  Physical Exam   VS:  BP (!) 148/99 (BP Location: Right Arm)   Pulse 64   Temp 98.2 F (36.8 C) (Oral)   Resp 16   Ht 5' 9" (1.753 m)   Wt 74.4 kg   SpO2 98%   BMI 24.22 kg/m  , BMI Body mass index is 24.22 kg/m. GENERAL:  Well appearing HEENT: Pupils equal round and reactive, fundi not visualized, oral mucosa unremarkable NECK:  No jugular venous distention, waveform within normal  limits, carotid upstroke brisk and symmetric, no bruits, no thyromegaly LYMPHATICS:  No cervical adenopathy LUNGS:  Clear to auscultation bilaterally HEART:  RRR.  PMI not displaced or sustained,S1 and S2 within normal limits, no S3, no S4, no clicks, no rubs, no murmurs ABD:  Flat, positive bowel sounds normal in frequency in pitch, no bruits, no rebound, no guarding, no midline pulsatile mass, no hepatomegaly, no splenomegaly EXT:  2 plus pulses throughout, no edema, no cyanosis no clubbing SKIN:  No rashes no nodules NEURO:  Cranial nerves II through XII grossly intact, motor grossly intact throughout PSYCH:  Cognitively intact, oriented to person place and time   Labs    High Sensitivity Troponin:  No results for input(s): TROPONINIHS in the last 720 hours.    Chemistry Recent Labs  Lab 05/11/19 0454 05/12/19 0216 05/13/19 0225  NA 138 139 140  K 3.8 3.8 3.6  CL 101 104 106  CO2 24 26 28  GLUCOSE 103* 113* 111*  BUN 16 14 17  CREATININE 0.83 0.82 0.92  CALCIUM 9.6 9.2 9.0  PROT 7.0  --   --     ALBUMIN 4.1  --   --   AST 60*  --   --   ALT 86*  --   --   ALKPHOS 40  --   --   BILITOT 0.7  --   --   GFRNONAA >60 >60 >60  GFRAA >60 >60 >60  ANIONGAP 13 9 6      Hematology Recent Labs  Lab 05/11/19 0454 05/12/19 0216 05/13/19 0225  WBC 6.9 6.6 6.5  RBC 4.83 4.50 4.38  HGB 15.6 14.4 14.2  HCT 44.7 42.2 40.6  MCV 92.5 93.8 92.7  MCH 32.3 32.0 32.4  MCHC 34.9 34.1 35.0  RDW 11.6 11.6 11.5  PLT 248 213 210    BNPNo results for input(s): BNP, PROBNP in the last 168 hours.   DDimer No results for input(s): DDIMER in the last 168 hours.   Radiology    No results found.  Cardiac Studies   Echo pening  LHC pending  Patient Profile     64 y.o. male with hypertension, hyperlipidemia, Jehovah's Witness, and family history of CAD here with unstable angina.  Assessment & Plan    # Unstable angina:  #Coronary calcification:  Coronary calcification noted  on chest CT.  EKG is without acute ischemic changes.  He is to have very mild chest discomfort but much better since starting heparin.  He is going for left heart catheterization this morning.  Echocardiogram is pending.  Of note, he is a 64.  Was started this admission.  He was previously been intolerant.  If he does not tolerate this time he will need to be considered for other therapies.  Continue carvedilol, aspirin, atorvastatin.   # Essential hypertension:   Blood pressure mildly elevated.  Continue losartan and carvedilol for now.  His home HCTZ and clonidine have been on hold.    # Dizziness:  Head CT showed some subtle hypodensity in the left frontal white matter which may have been microvascular change.  No acute infarct.  He did have some chronic small vessel disease.  Aspirin and statin as above.  He has been bradycardic, especially overnight.  ?sleep study  For questions or updates, please contact [facility] Please consult [email or web address] for contact info under        Signed, Scientist, product/process development, MD  05/13/2019, 9:24 AM

## 2019-05-13 NOTE — Interval H&P Note (Signed)
Cath Lab Visit (complete for each Cath Lab visit)  Clinical Evaluation Leading to the Procedure:   ACS: Yes.    Non-ACS:    Anginal Classification: CCS II  Anti-ischemic medical therapy: No Therapy  Non-Invasive Test Results: No non-invasive testing performed  Prior CABG: No previous CABG      History and Physical Interval Note:  05/13/2019 9:45 AM  Juan Payne  has presented today for surgery, with the diagnosis of unstable angina.  The various methods of treatment have been discussed with the patient and family. After consideration of risks, benefits and other options for treatment, the patient has consented to  Procedure(s): LEFT HEART CATH AND CORONARY ANGIOGRAPHY (N/A) as a surgical intervention.  The patient's history has been reviewed, patient examined, no change in status, stable for surgery.  I have reviewed the patient's chart and labs.  Questions were answered to the patient's satisfaction.     [name]

## 2019-05-13 NOTE — Progress Notes (Signed)
ANTICOAGULATION CONSULT NOTE  Pharmacy Consult for Heparin Indication: chest pain/ACS  Allergies  Allergen Reactions  . Morphine And Related Other (See Comments)    Drops BP     Patient Measurements: Height: 5\' 9"  (175.3 cm) Weight: 74.4 kg (164 lb 0.4 oz) IBW/kg (Calculated) : 70.7 Heparin Dosing Weight: TBW  Vital Signs: Temp: 98.4 F (36.9 C) (05/03 1050) Temp Source: Oral (05/03 1050) BP: 126/82 (05/03 1300) Pulse Rate: 62 (05/03 1246)  Labs: Recent Labs    05/11/19 0454 05/11/19 0454 05/11/19 1223 05/12/19 0216 05/13/19 0225  HGB 15.6   < >  --  14.4 14.2  HCT 44.7  --   --  42.2 40.6  PLT 248  --   --  213 210  HEPARINUNFRC 0.75*   < > 0.50 0.61 0.49  CREATININE 0.83  --   --  0.82 0.92   < > = values in this interval not displayed.    Estimated Creatinine Clearance: 81.1 mL/min (by C-G formula based on SCr of 0.92 mg/dL).   Medical History: Past Medical History:  Diagnosis Date  . Essential hypertension 05/07/2019  . Hyperlipidemia   . Refusal of blood transfusions as patient is Jehovah's Witness 05/11/2019    Medications:  . sodium chloride 75 mL/hr at 05/13/19 1506  . sodium chloride    . [START ON 05/14/2019] cefUROXime (ZINACEF)  IV    . [START ON 05/14/2019] cefUROXime (ZINACEF)  IV    . [START ON 05/14/2019] dexmedetomidine    . [START ON 05/14/2019] heparin 30,000 units/NS 1000 mL solution for CELLSAVER    . heparin    . [START ON 05/14/2019] milrinone    . [START ON 05/14/2019] nitroGLYCERIN    . [START ON 05/14/2019] norepinephrine    . [START ON 05/14/2019] tranexamic acid (CYKLOKAPRON) infusion ([facility])    . [START ON 05/14/2019] vancomycin       Assessment: 64 y.o. M transferred from [place] to [facility] for ACS workup. Pt started on heparin gtt (4000 unit bolus and 1100 units/hr) at [place] - started 4/30 ~1300.  Labs at [facility]:  Hgb 17, Hct 49.9, Plt 264, SCr 0.8  Heparin level  this today 0.49 within goal range on heparin drip rate 1000 uts/hr. No  bleeding or complications noted.  CBC, platelet count fairly stable. Heparin drip off post cath - asked to restart 4hr after sheath removed - out at 1030.  Plan TCTS and CABG.   Goal of Therapy:  Heparin level 0.3-0.7 units/ml Monitor platelets by anticoagulation protocol: Yes   Plan:  Restart heparin gtt at 1000 units/hr Daily heparin level and CBC F/u plans for turning heparin off pre op   [facility] Pharm.D. CPP, BCPS Clinical Pharmacist [phone] 05/13/2019 3:22 PM

## 2019-05-13 NOTE — Progress Notes (Signed)
Came to discuss preop ed however surgeon talking with pt then vasular is waiting. I left materials with staff to give to pt. Will f/u post op. [phone] Ethelda Chick CES, ACSM 3:13 PM 05/13/2019

## 2019-05-13 NOTE — Progress Notes (Signed)
  Echocardiogram 2D Echocardiogram has been performed.  [name] 05/13/2019, 3:49 PM

## 2019-05-13 NOTE — H&P (View-Only) (Signed)
Progress Note  Patient Name: Juan Payne Date of Encounter: 05/13/2019  Primary Cardiologist: Berniece Salines, DO   Subjective   Feeling well.  Minimal chest pain since starting heparin.  He does have exertional dyspnea.   Inpatient Medications    Scheduled Meds: . aspirin EC  81 mg Oral Daily  . atorvastatin  40 mg Oral q1800  . carvedilol  3.125 mg Oral BID WC  . citalopram  20 mg Oral QHS  . losartan  100 mg Oral Daily  . sodium chloride flush  3 mL Intravenous Q12H   Continuous Infusions: . sodium chloride    . sodium chloride 1 mL/kg/hr (05/13/19 0518)  . heparin 1,000 Units/hr (05/12/19 1230)  . lactated ringers 50 mL/hr at 05/12/19 1051   PRN Meds: sodium chloride, acetaminophen, ondansetron (ZOFRAN) IV, sodium chloride flush   Vital Signs    Vitals:   05/13/19 0359 05/13/19 0725 05/13/19 0744 05/13/19 0745  BP: 137/88  (!) 148/99   Pulse: 64     Resp: 13 16 13 16   Temp: 97.6 F (36.4 C)  98.2 F (36.8 C)   TempSrc: Oral  Oral   SpO2: 98%     Weight: 74.4 kg     Height:        Intake/Output Summary (Last 24 hours) at 05/13/2019 0924 Last data filed at 05/13/2019 0401 Gross per 24 hour  Intake 210 ml  Output 1500 ml  Net -1290 ml   Last 3 Weights 05/13/2019 05/12/2019 05/11/2019  Weight (lbs) 164 lb 0.4 oz 161 lb 1.6 oz 162 lb 4.1 oz  Weight (kg) 74.4 kg 73.074 kg 73.6 kg      Telemetry    Sinus rhythm/sinus bradycardia.  Bradycardia to the 40s overnight.- Personally Reviewed  ECG    05/12/2019: Sinus bradycardia.  Rate 51 bpm.  LAFB.  LVH. - Personally Reviewed  Physical Exam   VS:  BP (!) 148/99 (BP Location: Right Arm)   Pulse 64   Temp 98.2 F (36.8 C) (Oral)   Resp 16   Ht 5\' 9"  (1.753 m)   Wt 74.4 kg   SpO2 98%   BMI 24.22 kg/m  , BMI Body mass index is 24.22 kg/m. GENERAL:  Well appearing HEENT: Pupils equal round and reactive, fundi not visualized, oral mucosa unremarkable NECK:  No jugular venous distention, waveform within normal  limits, carotid upstroke brisk and symmetric, no bruits, no thyromegaly LYMPHATICS:  No cervical adenopathy LUNGS:  Clear to auscultation bilaterally HEART:  RRR.  PMI not displaced or sustained,S1 and S2 within normal limits, no S3, no S4, no clicks, no rubs, no murmurs ABD:  Flat, positive bowel sounds normal in frequency in pitch, no bruits, no rebound, no guarding, no midline pulsatile mass, no hepatomegaly, no splenomegaly EXT:  2 plus pulses throughout, no edema, no cyanosis no clubbing SKIN:  No rashes no nodules NEURO:  Cranial nerves II through XII grossly intact, motor grossly intact throughout PSYCH:  Cognitively intact, oriented to person place and time   Labs    High Sensitivity Troponin:  No results for input(s): TROPONINIHS in the last 720 hours.    Chemistry Recent Labs  Lab 05/11/19 0454 05/12/19 0216 05/13/19 0225  NA 138 139 140  K 3.8 3.8 3.6  CL 101 104 106  CO2 24 26 28   GLUCOSE 103* 113* 111*  BUN 16 14 17   CREATININE 0.83 0.82 0.92  CALCIUM 9.6 9.2 9.0  PROT 7.0  --   --  ALBUMIN 4.1  --   --   AST 60*  --   --   ALT 86*  --   --   ALKPHOS 40  --   --   BILITOT 0.7  --   --   GFRNONAA >60 >60 >60  GFRAA >60 >60 >60  ANIONGAP 13 9 6      Hematology Recent Labs  Lab 05/11/19 0454 05/12/19 0216 05/13/19 0225  WBC 6.9 6.6 6.5  RBC 4.83 4.50 4.38  HGB 15.6 14.4 14.2  HCT 44.7 42.2 40.6  MCV 92.5 93.8 92.7  MCH 32.3 32.0 32.4  MCHC 34.9 34.1 35.0  RDW 11.6 11.6 11.5  PLT 248 213 210    BNPNo results for input(s): BNP, PROBNP in the last 168 hours.   DDimer No results for input(s): DDIMER in the last 168 hours.   Radiology    No results found.  Cardiac Studies   Echo pening  LHC pending  Patient Profile     64 y.o. male with hypertension, hyperlipidemia, Jehovah's Witness, and family history of CAD here with unstable angina.  Assessment & Plan    # Unstable angina:  #Coronary calcification:  Coronary calcification noted  on chest CT.  EKG is without acute ischemic changes.  He is to have very mild chest discomfort but much better since starting heparin.  He is going for left heart catheterization this morning.  Echocardiogram is pending.  Of note, he is a 76.  Was started this admission.  He was previously been intolerant.  If he does not tolerate this time he will need to be considered for other therapies.  Continue carvedilol, aspirin, atorvastatin.   # Essential hypertension:   Blood pressure mildly elevated.  Continue losartan and carvedilol for now.  His home HCTZ and clonidine have been on hold.    # Dizziness:  Head CT showed some subtle hypodensity in the left frontal white matter which may have been microvascular change.  No acute infarct.  He did have some chronic small vessel disease.  Aspirin and statin as above.  He has been bradycardic, especially overnight.  ?sleep study  For questions or updates, please contact [facility] Please consult [email or web address] for contact info under        Signed, Scientist, product/process development, MD  05/13/2019, 9:24 AM

## 2019-05-14 ENCOUNTER — Inpatient Hospital Stay (HOSPITAL_COMMUNITY): Payer: 59

## 2019-05-14 ENCOUNTER — Inpatient Hospital Stay (HOSPITAL_COMMUNITY): Payer: 59 | Admitting: Certified Registered Nurse Anesthetist

## 2019-05-14 ENCOUNTER — Encounter (HOSPITAL_COMMUNITY)
Admission: AD | Disposition: A | Discharge status: Home / Self Care | Payer: Self-pay | Source: Other Acute Inpatient Hospital | Attending: Cardiothoracic Surgery

## 2019-05-14 DIAGNOSIS — Z951 Presence of aortocoronary bypass graft: Secondary | ICD-10-CM

## 2019-05-14 DIAGNOSIS — I2510 Atherosclerotic heart disease of native coronary artery without angina pectoris: Secondary | ICD-10-CM

## 2019-05-14 HISTORY — DX: Atherosclerotic heart disease of native coronary artery without angina pectoris: I25.10

## 2019-05-14 HISTORY — DX: Presence of aortocoronary bypass graft: Z95.1

## 2019-05-14 HISTORY — PX: RADIAL ARTERY HARVEST: SHX5067

## 2019-05-14 HISTORY — PX: CORONARY ARTERY BYPASS GRAFT: SHX141

## 2019-05-14 HISTORY — PX: TEE WITHOUT CARDIOVERSION: SHX5443

## 2019-05-14 LAB — POCT I-STAT 7, (LYTES, BLD GAS, ICA,H+H)
Acid-Base Excess: 2 mmol/L (ref 0.0–2.0)
Acid-Base Excess: 1 mmol/L (ref 0.0–2.0)
Acid-Base Excess: 5 mmol/L — ABNORMAL HIGH (ref 0.0–2.0)
Acid-Base Excess: 4 mmol/L — ABNORMAL HIGH (ref 0.0–2.0)
Acid-Base Excess: 1 mmol/L (ref 0.0–2.0)
Acid-Base Excess: 0 mmol/L (ref 0.0–2.0)
Acid-base deficit: 2 mmol/L (ref 0.0–2.0)
Acid-base deficit: 1 mmol/L (ref 0.0–2.0)
Bicarbonate: 27.9 mmol/L (ref 20.0–28.0)
Bicarbonate: 25.3 mmol/L (ref 20.0–28.0)
Bicarbonate: 28.7 mmol/L — ABNORMAL HIGH (ref 20.0–28.0)
Bicarbonate: 28.1 mmol/L — ABNORMAL HIGH (ref 20.0–28.0)
Bicarbonate: 25.2 mmol/L (ref 20.0–28.0)
Bicarbonate: 24.6 mmol/L (ref 20.0–28.0)
Bicarbonate: 24.4 mmol/L (ref 20.0–28.0)
Bicarbonate: 25.2 mmol/L (ref 20.0–28.0)
Calcium, Ion: 1.22 mmol/L (ref 1.15–1.40)
Calcium, Ion: 1.01 mmol/L — ABNORMAL LOW (ref 1.15–1.40)
Calcium, Ion: 1.2 mmol/L (ref 1.15–1.40)
Calcium, Ion: 1.04 mmol/L — ABNORMAL LOW (ref 1.15–1.40)
Calcium, Ion: 1.08 mmol/L — ABNORMAL LOW (ref 1.15–1.40)
Calcium, Ion: 1.12 mmol/L — ABNORMAL LOW (ref 1.15–1.40)
Calcium, Ion: 1.14 mmol/L — ABNORMAL LOW (ref 1.15–1.40)
Calcium, Ion: 1.18 mmol/L (ref 1.15–1.40)
HCT: 40 % (ref 39.0–52.0)
HCT: 32 % — ABNORMAL LOW (ref 39.0–52.0)
HCT: 36 % — ABNORMAL LOW (ref 39.0–52.0)
HCT: 33 % — ABNORMAL LOW (ref 39.0–52.0)
HCT: 34 % — ABNORMAL LOW (ref 39.0–52.0)
HCT: 33 % — ABNORMAL LOW (ref 39.0–52.0)
HCT: 31 % — ABNORMAL LOW (ref 39.0–52.0)
HCT: 32 % — ABNORMAL LOW (ref 39.0–52.0)
Hemoglobin: 13.6 g/dL (ref 13.0–17.0)
Hemoglobin: 10.9 g/dL — ABNORMAL LOW (ref 13.0–17.0)
Hemoglobin: 12.2 g/dL — ABNORMAL LOW (ref 13.0–17.0)
Hemoglobin: 11.2 g/dL — ABNORMAL LOW (ref 13.0–17.0)
Hemoglobin: 11.6 g/dL — ABNORMAL LOW (ref 13.0–17.0)
Hemoglobin: 11.2 g/dL — ABNORMAL LOW (ref 13.0–17.0)
Hemoglobin: 10.5 g/dL — ABNORMAL LOW (ref 13.0–17.0)
Hemoglobin: 10.9 g/dL — ABNORMAL LOW (ref 13.0–17.0)
O2 Saturation: 100 %
O2 Saturation: 100 %
O2 Saturation: 100 %
O2 Saturation: 100 %
O2 Saturation: 100 %
O2 Saturation: 98 %
O2 Saturation: 97 %
O2 Saturation: 95 %
Patient temperature: 35.8
Patient temperature: 37
Patient temperature: 37
Potassium: 4.1 mmol/L (ref 3.5–5.1)
Potassium: 4.6 mmol/L (ref 3.5–5.1)
Potassium: 4.9 mmol/L (ref 3.5–5.1)
Potassium: 5.6 mmol/L — ABNORMAL HIGH (ref 3.5–5.1)
Potassium: 4.6 mmol/L (ref 3.5–5.1)
Potassium: 3.6 mmol/L (ref 3.5–5.1)
Potassium: 5 mmol/L (ref 3.5–5.1)
Potassium: 4.9 mmol/L (ref 3.5–5.1)
Sodium: 141 mmol/L (ref 135–145)
Sodium: 138 mmol/L (ref 135–145)
Sodium: 140 mmol/L (ref 135–145)
Sodium: 137 mmol/L (ref 135–145)
Sodium: 140 mmol/L (ref 135–145)
Sodium: 142 mmol/L (ref 135–145)
Sodium: 140 mmol/L (ref 135–145)
Sodium: 141 mmol/L (ref 135–145)
TCO2: 29 mmol/L (ref 22–32)
TCO2: 26 mmol/L (ref 22–32)
TCO2: 30 mmol/L (ref 22–32)
TCO2: 29 mmol/L (ref 22–32)
TCO2: 26 mmol/L (ref 22–32)
TCO2: 26 mmol/L (ref 22–32)
TCO2: 26 mmol/L (ref 22–32)
TCO2: 27 mmol/L (ref 22–32)
pCO2 arterial: 47.5 mmHg (ref 32.0–48.0)
pCO2 arterial: 38.4 mmHg (ref 32.0–48.0)
pCO2 arterial: 38.7 mmHg (ref 32.0–48.0)
pCO2 arterial: 41.3 mmHg (ref 32.0–48.0)
pCO2 arterial: 38 mmHg (ref 32.0–48.0)
pCO2 arterial: 36.4 mmHg (ref 32.0–48.0)
pCO2 arterial: 47.4 mmHg (ref 32.0–48.0)
pCO2 arterial: 49.8 mmHg — ABNORMAL HIGH (ref 32.0–48.0)
pH, Arterial: 7.376 (ref 7.350–7.450)
pH, Arterial: 7.423 (ref 7.350–7.450)
pH, Arterial: 7.482 — ABNORMAL HIGH (ref 7.350–7.450)
pH, Arterial: 7.441 (ref 7.350–7.450)
pH, Arterial: 7.43 (ref 7.350–7.450)
pH, Arterial: 7.434 (ref 7.350–7.450)
pH, Arterial: 7.32 — ABNORMAL LOW (ref 7.350–7.450)
pH, Arterial: 7.311 — ABNORMAL LOW (ref 7.350–7.450)
pO2, Arterial: 391 mmHg — ABNORMAL HIGH (ref 83.0–108.0)
pO2, Arterial: 207 mmHg — ABNORMAL HIGH (ref 83.0–108.0)
pO2, Arterial: 372 mmHg — ABNORMAL HIGH (ref 83.0–108.0)
pO2, Arterial: 475 mmHg — ABNORMAL HIGH (ref 83.0–108.0)
pO2, Arterial: 259 mmHg — ABNORMAL HIGH (ref 83.0–108.0)
pO2, Arterial: 96 mmHg (ref 83.0–108.0)
pO2, Arterial: 103 mmHg (ref 83.0–108.0)
pO2, Arterial: 86 mmHg (ref 83.0–108.0)

## 2019-05-14 LAB — CBC
HCT: 41.6 % (ref 39.0–52.0)
HCT: 31.4 % — ABNORMAL LOW (ref 39.0–52.0)
HCT: 32.4 % — ABNORMAL LOW (ref 39.0–52.0)
Hemoglobin: 14.3 g/dL (ref 13.0–17.0)
Hemoglobin: 11.1 g/dL — ABNORMAL LOW (ref 13.0–17.0)
Hemoglobin: 11.1 g/dL — ABNORMAL LOW (ref 13.0–17.0)
MCH: 32.4 pg (ref 26.0–34.0)
MCH: 33.4 pg (ref 26.0–34.0)
MCH: 32.8 pg (ref 26.0–34.0)
MCHC: 34.4 g/dL (ref 30.0–36.0)
MCHC: 35.4 g/dL (ref 30.0–36.0)
MCHC: 34.3 g/dL (ref 30.0–36.0)
MCV: 94.3 fL (ref 80.0–100.0)
MCV: 94.6 fL (ref 80.0–100.0)
MCV: 95.9 fL (ref 80.0–100.0)
Platelets: 211 10*3/uL (ref 150–400)
Platelets: 125 10*3/uL — ABNORMAL LOW (ref 150–400)
Platelets: 163 10*3/uL (ref 150–400)
RBC: 4.41 MIL/uL (ref 4.22–5.81)
RBC: 3.32 MIL/uL — ABNORMAL LOW (ref 4.22–5.81)
RBC: 3.38 MIL/uL — ABNORMAL LOW (ref 4.22–5.81)
RDW: 11.6 % (ref 11.5–15.5)
RDW: 11.6 % (ref 11.5–15.5)
RDW: 11.8 % (ref 11.5–15.5)
WBC: 7.7 10*3/uL (ref 4.0–10.5)
WBC: 9.4 10*3/uL (ref 4.0–10.5)
WBC: 10.7 10*3/uL — ABNORMAL HIGH (ref 4.0–10.5)
nRBC: 0 % (ref 0.0–0.2)
nRBC: 0 % (ref 0.0–0.2)
nRBC: 0 % (ref 0.0–0.2)

## 2019-05-14 LAB — BASIC METABOLIC PANEL
Anion gap: 10 (ref 5–15)
Anion gap: 8 (ref 5–15)
BUN: 14 mg/dL (ref 8–23)
BUN: 13 mg/dL (ref 8–23)
CO2: 27 mmol/L (ref 22–32)
CO2: 23 mmol/L (ref 22–32)
Calcium: 9.3 mg/dL (ref 8.9–10.3)
Calcium: 7.9 mg/dL — ABNORMAL LOW (ref 8.9–10.3)
Chloride: 102 mmol/L (ref 98–111)
Chloride: 108 mmol/L (ref 98–111)
Creatinine, Ser: 0.77 mg/dL (ref 0.61–1.24)
Creatinine, Ser: 0.75 mg/dL (ref 0.61–1.24)
GFR calc Af Amer: >60 mL/min (ref >60)
GFR calc Af Amer: >60 mL/min (ref >60)
GFR calc non Af Amer: >60 mL/min (ref >60)
GFR calc non Af Amer: >60 mL/min (ref >60)
Glucose, Bld: 117 mg/dL — ABNORMAL HIGH (ref 70–99)
Glucose, Bld: 123 mg/dL — ABNORMAL HIGH (ref 70–99)
Potassium: 3.7 mmol/L (ref 3.5–5.1)
Potassium: 4.8 mmol/L (ref 3.5–5.1)
Sodium: 139 mmol/L (ref 135–145)
Sodium: 139 mmol/L (ref 135–145)

## 2019-05-14 LAB — PLATELET COUNT: Platelets: 123 10*3/uL — ABNORMAL LOW (ref 150–400)

## 2019-05-14 LAB — POCT I-STAT, CHEM 8
BUN: 13 mg/dL (ref 8–23)
BUN: 10 mg/dL (ref 8–23)
BUN: 11 mg/dL (ref 8–23)
BUN: 10 mg/dL (ref 8–23)
Calcium, Ion: 1.29 mmol/L (ref 1.15–1.40)
Calcium, Ion: 1.01 mmol/L — ABNORMAL LOW (ref 1.15–1.40)
Calcium, Ion: 1.06 mmol/L — ABNORMAL LOW (ref 1.15–1.40)
Calcium, Ion: 1.09 mmol/L — ABNORMAL LOW (ref 1.15–1.40)
Chloride: 102 mmol/L (ref 98–111)
Chloride: 100 mmol/L (ref 98–111)
Chloride: 100 mmol/L (ref 98–111)
Chloride: 102 mmol/L (ref 98–111)
Creatinine, Ser: 0.6 mg/dL — ABNORMAL LOW (ref 0.61–1.24)
Creatinine, Ser: 0.5 mg/dL — ABNORMAL LOW (ref 0.61–1.24)
Creatinine, Ser: 0.5 mg/dL — ABNORMAL LOW (ref 0.61–1.24)
Creatinine, Ser: 0.5 mg/dL — ABNORMAL LOW (ref 0.61–1.24)
Glucose, Bld: 99 mg/dL (ref 70–99)
Glucose, Bld: 97 mg/dL (ref 70–99)
Glucose, Bld: 113 mg/dL — ABNORMAL HIGH (ref 70–99)
Glucose, Bld: 120 mg/dL — ABNORMAL HIGH (ref 70–99)
HCT: 38 % — ABNORMAL LOW (ref 39.0–52.0)
HCT: 28 % — ABNORMAL LOW (ref 39.0–52.0)
HCT: 30 % — ABNORMAL LOW (ref 39.0–52.0)
HCT: 29 % — ABNORMAL LOW (ref 39.0–52.0)
Hemoglobin: 12.9 g/dL — ABNORMAL LOW (ref 13.0–17.0)
Hemoglobin: 9.5 g/dL — ABNORMAL LOW (ref 13.0–17.0)
Hemoglobin: 10.2 g/dL — ABNORMAL LOW (ref 13.0–17.0)
Hemoglobin: 9.9 g/dL — ABNORMAL LOW (ref 13.0–17.0)
Potassium: 4.1 mmol/L (ref 3.5–5.1)
Potassium: 4.9 mmol/L (ref 3.5–5.1)
Potassium: 5.6 mmol/L — ABNORMAL HIGH (ref 3.5–5.1)
Potassium: 4.6 mmol/L (ref 3.5–5.1)
Sodium: 142 mmol/L (ref 135–145)
Sodium: 140 mmol/L (ref 135–145)
Sodium: 137 mmol/L (ref 135–145)
Sodium: 139 mmol/L (ref 135–145)
TCO2: 29 mmol/L (ref 22–32)
TCO2: 32 mmol/L (ref 22–32)
TCO2: 30 mmol/L (ref 22–32)
TCO2: 29 mmol/L (ref 22–32)

## 2019-05-14 LAB — GLUCOSE, CAPILLARY
Glucose-Capillary: 121 mg/dL — ABNORMAL HIGH (ref 70–99)
Glucose-Capillary: 123 mg/dL — ABNORMAL HIGH (ref 70–99)
Glucose-Capillary: 124 mg/dL — ABNORMAL HIGH (ref 70–99)
Glucose-Capillary: 124 mg/dL — ABNORMAL HIGH (ref 70–99)
Glucose-Capillary: 118 mg/dL — ABNORMAL HIGH (ref 70–99)
Glucose-Capillary: 122 mg/dL — ABNORMAL HIGH (ref 70–99)
Glucose-Capillary: 146 mg/dL — ABNORMAL HIGH (ref 70–99)
Glucose-Capillary: 125 mg/dL — ABNORMAL HIGH (ref 70–99)
Glucose-Capillary: 116 mg/dL — ABNORMAL HIGH (ref 70–99)
Glucose-Capillary: 124 mg/dL — ABNORMAL HIGH (ref 70–99)
Glucose-Capillary: 117 mg/dL — ABNORMAL HIGH (ref 70–99)

## 2019-05-14 LAB — HEMOGLOBIN A1C
Hgb A1c MFr Bld: 5.6 % (ref 4.8–5.6)
Mean Plasma Glucose: 114.02 mg/dL

## 2019-05-14 LAB — PROTIME-INR
INR: 1.1 (ref 0.8–1.2)
INR: 1.4 — ABNORMAL HIGH (ref 0.8–1.2)
Prothrombin Time: 13.5 seconds (ref 11.4–15.2)
Prothrombin Time: 17 seconds — ABNORMAL HIGH (ref 11.4–15.2)

## 2019-05-14 LAB — HEMOGLOBIN AND HEMATOCRIT, BLOOD
HCT: 29.6 % — ABNORMAL LOW (ref 39.0–52.0)
Hemoglobin: 10.2 g/dL — ABNORMAL LOW (ref 13.0–17.0)

## 2019-05-14 LAB — TSH: TSH: 6.549 u[IU]/mL — ABNORMAL HIGH (ref 0.350–4.500)

## 2019-05-14 LAB — ECHO INTRAOPERATIVE TEE
Height: 69 in
Weight: 2596.14 oz

## 2019-05-14 LAB — APTT: aPTT: 26 seconds (ref 24–36)

## 2019-05-14 LAB — MAGNESIUM: Magnesium: 2.7 mg/dL — ABNORMAL HIGH (ref 1.7–2.4)

## 2019-05-14 LAB — PHOSPHORUS: Phosphorus: 3.9 mg/dL (ref 2.5–4.6)

## 2019-05-14 LAB — HEPARIN LEVEL (UNFRACTIONATED): Heparin Unfractionated: 0.41 IU/mL (ref 0.30–0.70)

## 2019-05-14 SURGERY — CORONARY ARTERY BYPASS GRAFTING (CABG)
Anesthesia: General | Site: Chest

## 2019-05-14 MED ORDER — SODIUM CHLORIDE 0.9 % IV SOLN: 250.0000 mL | INTRAVENOUS | Status: DC

## 2019-05-14 MED ORDER — SODIUM CHLORIDE (PF) 0.9 % IJ SOLN
INTRAMUSCULAR | Status: AC
  Filled 2019-05-14: qty 20

## 2019-05-14 MED ORDER — ASPIRIN 81 MG PO CHEW
324.0000 mg | CHEWABLE_TABLET | Freq: Every day | ORAL | Status: DC
  Administered 2019-05-19: 324 mg
  Filled 2019-05-14: qty 4

## 2019-05-14 MED ORDER — CHLORHEXIDINE GLUCONATE 0.12 % MT SOLN
15.0000 mL | OROMUCOSAL | Status: AC
  Administered 2019-05-14: 15 mL via OROMUCOSAL
  Filled 2019-05-14: qty 15

## 2019-05-14 MED ORDER — METOPROLOL TARTRATE 12.5 MG HALF TABLET
12.5000 mg | ORAL_TABLET | Freq: Two times a day (BID) | ORAL | Status: DC
  Administered 2019-05-15 (×2): 12.5 mg via ORAL
  Filled 2019-05-14 (×3): qty 1

## 2019-05-14 MED ORDER — MAGNESIUM SULFATE 4 GM/100ML IV SOLN
4.0000 g | Freq: Once | INTRAVENOUS | Status: AC
  Administered 2019-05-14: 4 g via INTRAVENOUS
  Filled 2019-05-14: qty 100

## 2019-05-14 MED ORDER — ACETAMINOPHEN 160 MG/5ML PO SOLN: 650.0000 mg | Freq: Once | ORAL | Status: AC

## 2019-05-14 MED ORDER — ROCURONIUM BROMIDE 10 MG/ML (PF) SYRINGE
PREFILLED_SYRINGE | INTRAVENOUS | Status: AC
  Filled 2019-05-14: qty 10

## 2019-05-14 MED ORDER — BISACODYL 10 MG RE SUPP: 10.0000 mg | Freq: Every day | RECTAL | Status: DC

## 2019-05-14 MED ORDER — DOCUSATE SODIUM 100 MG PO CAPS
200.0000 mg | ORAL_CAPSULE | Freq: Every day | ORAL | Status: DC
  Administered 2019-05-15 – 2019-05-19 (×4): 200 mg via ORAL
  Filled 2019-05-14 (×5): qty 2

## 2019-05-14 MED ORDER — HEPARIN SODIUM (PORCINE) 1000 UNIT/ML IJ SOLN
INTRAMUSCULAR | Status: DC | PRN
  Administered 2019-05-14: 24000 [IU] via INTRAVENOUS
  Administered 2019-05-14: 2000 [IU] via INTRAVENOUS

## 2019-05-14 MED ORDER — ARTIFICIAL TEARS OPHTHALMIC OINT
TOPICAL_OINTMENT | OPHTHALMIC | Status: DC | PRN
  Administered 2019-05-14: 1 via OPHTHALMIC

## 2019-05-14 MED ORDER — FENTANYL CITRATE (PF) 250 MCG/5ML IJ SOLN
INTRAMUSCULAR | Status: DC | PRN
  Administered 2019-05-14: 25 ug via INTRAVENOUS
  Administered 2019-05-14 (×5): 50 ug via INTRAVENOUS
  Administered 2019-05-14 (×2): 100 ug via INTRAVENOUS
  Administered 2019-05-14: 325 ug via INTRAVENOUS
  Administered 2019-05-14: 100 ug via INTRAVENOUS
  Administered 2019-05-14: 50 ug via INTRAVENOUS

## 2019-05-14 MED ORDER — ASPIRIN EC 325 MG PO TBEC
325.0000 mg | DELAYED_RELEASE_TABLET | Freq: Every day | ORAL | Status: DC
  Administered 2019-05-15 – 2019-05-18 (×4): 325 mg via ORAL
  Filled 2019-05-14 (×4): qty 1

## 2019-05-14 MED ORDER — ROCURONIUM BROMIDE 100 MG/10ML IV SOLN: INTRAVENOUS | Status: DC | PRN

## 2019-05-14 MED ORDER — LACTATED RINGERS IV SOLN: INTRAVENOUS | Status: DC

## 2019-05-14 MED ORDER — VANCOMYCIN HCL IN DEXTROSE 1-5 GM/200ML-% IV SOLN
1000.0000 mg | Freq: Once | INTRAVENOUS | Status: AC
  Administered 2019-05-14: 1000 mg via INTRAVENOUS
  Filled 2019-05-14: qty 200

## 2019-05-14 MED ORDER — SODIUM CHLORIDE 0.9% FLUSH
10.0000 mL | INTRAVENOUS | Status: DC | PRN
  Administered 2019-05-16: 10 mL

## 2019-05-14 MED ORDER — MIDAZOLAM HCL (PF) 10 MG/2ML IJ SOLN
INTRAMUSCULAR | Status: AC
  Filled 2019-05-14: qty 2

## 2019-05-14 MED ORDER — ARTIFICIAL TEARS OPHTHALMIC OINT
TOPICAL_OINTMENT | OPHTHALMIC | Status: AC
  Filled 2019-05-14: qty 3.5

## 2019-05-14 MED ORDER — POTASSIUM CHLORIDE 10 MEQ/50ML IV SOLN
10.0000 meq | INTRAVENOUS | Status: AC
  Administered 2019-05-14 (×3): 10 meq via INTRAVENOUS

## 2019-05-14 MED ORDER — MIDAZOLAM HCL 5 MG/5ML IJ SOLN
INTRAMUSCULAR | Status: DC | PRN
  Administered 2019-05-14 (×3): 1 mg via INTRAVENOUS
  Administered 2019-05-14: 4 mg via INTRAVENOUS
  Administered 2019-05-14: 3 mg via INTRAVENOUS
  Administered 2019-05-14 (×2): 1 mg via INTRAVENOUS

## 2019-05-14 MED ORDER — METOPROLOL TARTRATE 25 MG/10 ML ORAL SUSPENSION: 12.5000 mg | Freq: Two times a day (BID) | ORAL | Status: DC

## 2019-05-14 MED ORDER — LACTATED RINGERS IV SOLN
INTRAVENOUS | Status: DC | PRN
Start: 2019-05-14 — End: 2019-05-14

## 2019-05-14 MED ORDER — HEMOSTATIC AGENTS (NO CHARGE) OPTIME
TOPICAL | Status: DC | PRN
  Administered 2019-05-14 (×3): 1 via TOPICAL

## 2019-05-14 MED ORDER — OXYCODONE HCL 5 MG PO TABS
5.0000 mg | ORAL_TABLET | ORAL | Status: DC | PRN
  Administered 2019-05-14: 5 mg via ORAL
  Filled 2019-05-14: qty 1

## 2019-05-14 MED ORDER — EPHEDRINE 5 MG/ML INJ
INTRAVENOUS | Status: AC
  Filled 2019-05-14: qty 10

## 2019-05-14 MED ORDER — ACETAMINOPHEN 650 MG RE SUPP
650.0000 mg | Freq: Once | RECTAL | Status: AC
  Administered 2019-05-14: 650 mg via RECTAL

## 2019-05-14 MED ORDER — ACETAMINOPHEN 500 MG PO TABS
1000.0000 mg | ORAL_TABLET | Freq: Four times a day (QID) | ORAL | Status: DC
  Administered 2019-05-14 – 2019-05-19 (×18): 1000 mg via ORAL
  Filled 2019-05-14 (×18): qty 2

## 2019-05-14 MED ORDER — SODIUM CHLORIDE (PF) 0.9 % IJ SOLN
INTRAMUSCULAR | Status: AC
  Filled 2019-05-14: qty 10

## 2019-05-14 MED ORDER — CHLORHEXIDINE GLUCONATE CLOTH 2 % EX PADS
6.0000 | MEDICATED_PAD | Freq: Every day | CUTANEOUS | Status: DC
  Administered 2019-05-15 – 2019-05-17 (×3): 6 via TOPICAL

## 2019-05-14 MED ORDER — SODIUM CHLORIDE 0.9 % IV SOLN
20.0000 ug | Freq: Once | INTRAVENOUS | Status: AC
  Administered 2019-05-14: 20 ug via INTRAVENOUS
  Filled 2019-05-14: qty 5

## 2019-05-14 MED ORDER — HEPARIN SODIUM (PORCINE) 1000 UNIT/ML IJ SOLN
INTRAMUSCULAR | Status: AC
  Filled 2019-05-14: qty 1

## 2019-05-14 MED ORDER — HYDROXYZINE HCL 25 MG PO TABS: 25.0000 mg | ORAL_TABLET | Freq: Three times a day (TID) | ORAL | Status: DC | PRN

## 2019-05-14 MED ORDER — FENTANYL CITRATE (PF) 250 MCG/5ML IJ SOLN
INTRAMUSCULAR | Status: AC
  Filled 2019-05-14: qty 25

## 2019-05-14 MED ORDER — ACETAMINOPHEN 160 MG/5ML PO SOLN: 1000.0000 mg | Freq: Four times a day (QID) | ORAL | Status: DC

## 2019-05-14 MED ORDER — SODIUM CHLORIDE 0.9% FLUSH: 3.0000 mL | Freq: Two times a day (BID) | INTRAVENOUS | Status: DC

## 2019-05-14 MED ORDER — 0.9 % SODIUM CHLORIDE (POUR BTL) OPTIME
TOPICAL | Status: DC | PRN
  Administered 2019-05-14: 6000 mL

## 2019-05-14 MED ORDER — PHENYLEPHRINE 40 MCG/ML (10ML) SYRINGE FOR IV PUSH (FOR BLOOD PRESSURE SUPPORT)
PREFILLED_SYRINGE | INTRAVENOUS | Status: AC
  Filled 2019-05-14: qty 10

## 2019-05-14 MED ORDER — SODIUM CHLORIDE 0.45 % IV SOLN: INTRAVENOUS | Status: DC | PRN

## 2019-05-14 MED ORDER — CHLORHEXIDINE GLUCONATE CLOTH 2 % EX PADS
6.0000 | MEDICATED_PAD | Freq: Every day | CUTANEOUS | Status: DC
  Administered 2019-05-14: 6 via TOPICAL

## 2019-05-14 MED ORDER — ALBUMIN HUMAN 5 % IV SOLN: INTRAVENOUS | Status: DC | PRN

## 2019-05-14 MED ORDER — PHENYLEPHRINE HCL-NACL 20-0.9 MG/250ML-% IV SOLN: 0.0000 ug/min | INTRAVENOUS | Status: DC

## 2019-05-14 MED ORDER — DEXMEDETOMIDINE HCL IN NACL 400 MCG/100ML IV SOLN
0.0000 ug/kg/h | INTRAVENOUS | Status: DC
  Administered 2019-05-14: 0.7 ug/kg/h via INTRAVENOUS
  Filled 2019-05-14: qty 100

## 2019-05-14 MED ORDER — LACTATED RINGERS IV SOLN: 500.0000 mL | Freq: Once | INTRAVENOUS | Status: DC | PRN

## 2019-05-14 MED ORDER — DEXTROSE 50 % IV SOLN: 0.0000 mL | INTRAVENOUS | Status: DC | PRN

## 2019-05-14 MED ORDER — MIDAZOLAM HCL 2 MG/2ML IJ SOLN
INTRAMUSCULAR | Status: AC
  Filled 2019-05-14: qty 2

## 2019-05-14 MED ORDER — BISACODYL 5 MG PO TBEC
10.0000 mg | DELAYED_RELEASE_TABLET | Freq: Every day | ORAL | Status: DC
  Administered 2019-05-15 – 2019-05-18 (×3): 10 mg via ORAL
  Filled 2019-05-14 (×4): qty 2

## 2019-05-14 MED ORDER — NITROGLYCERIN IN D5W 200-5 MCG/ML-% IV SOLN
0.0000 ug/min | INTRAVENOUS | Status: DC
Start: 2019-05-14 — End: 2019-05-14

## 2019-05-14 MED ORDER — PANTOPRAZOLE SODIUM 40 MG PO TBEC
40.0000 mg | DELAYED_RELEASE_TABLET | Freq: Every day | ORAL | Status: DC
  Administered 2019-05-16 – 2019-05-19 (×4): 40 mg via ORAL
  Filled 2019-05-14 (×4): qty 1

## 2019-05-14 MED ORDER — PROTAMINE SULFATE 10 MG/ML IV SOLN
INTRAVENOUS | Status: AC
  Filled 2019-05-14: qty 5

## 2019-05-14 MED ORDER — PROPOFOL 10 MG/ML IV BOLUS
INTRAVENOUS | Status: DC | PRN
  Administered 2019-05-14: 40 mg via INTRAVENOUS

## 2019-05-14 MED ORDER — PROTAMINE SULFATE 10 MG/ML IV SOLN
INTRAVENOUS | Status: DC | PRN
  Administered 2019-05-14: 290 mg via INTRAVENOUS

## 2019-05-14 MED ORDER — TRAMADOL HCL 50 MG PO TABS
50.0000 mg | ORAL_TABLET | ORAL | Status: DC | PRN
  Administered 2019-05-14 – 2019-05-18 (×8): 50 mg via ORAL
  Filled 2019-05-14 (×8): qty 1

## 2019-05-14 MED ORDER — SODIUM CHLORIDE 0.9 % IV SOLN: INTRAVENOUS | Status: DC

## 2019-05-14 MED ORDER — ALBUMIN HUMAN 5 % IV SOLN
250.0000 mL | INTRAVENOUS | Status: AC | PRN
  Administered 2019-05-14 (×2): 12.5 g via INTRAVENOUS

## 2019-05-14 MED ORDER — INSULIN REGULAR(HUMAN) IN NACL 100-0.9 UT/100ML-% IV SOLN: INTRAVENOUS | Status: DC

## 2019-05-14 MED ORDER — FAMOTIDINE IN NACL 20-0.9 MG/50ML-% IV SOLN
20.0000 mg | Freq: Two times a day (BID) | INTRAVENOUS | Status: AC
  Administered 2019-05-14 (×2): 20 mg via INTRAVENOUS
  Filled 2019-05-14: qty 50

## 2019-05-14 MED ORDER — PHENYLEPHRINE HCL (PRESSORS) 10 MG/ML IV SOLN
INTRAVENOUS | Status: DC | PRN
Start: 2019-05-14 — End: 2019-05-14
  Administered 2019-05-14 (×3): 40 ug via INTRAVENOUS

## 2019-05-14 MED ORDER — SODIUM CHLORIDE 0.9% FLUSH: 3.0000 mL | INTRAVENOUS | Status: DC | PRN

## 2019-05-14 MED ORDER — SODIUM CHLORIDE 0.9 % IV SOLN: INTRAVENOUS | Status: DC | PRN

## 2019-05-14 MED ORDER — ONDANSETRON HCL 4 MG/2ML IJ SOLN: 4.0000 mg | Freq: Four times a day (QID) | INTRAMUSCULAR | Status: DC | PRN

## 2019-05-14 MED ORDER — SODIUM CHLORIDE (PF) 0.9 % IJ SOLN
OROMUCOSAL | Status: DC | PRN
  Administered 2019-05-14 (×3): 4 mL via TOPICAL

## 2019-05-14 MED ORDER — PLASMA-LYTE 148 IV SOLN
INTRAVENOUS | Status: DC | PRN
  Administered 2019-05-14: 500 mL via INTRAVASCULAR

## 2019-05-14 MED ORDER — NITROGLYCERIN IN D5W 200-5 MCG/ML-% IV SOLN: 2.0000 ug/min | INTRAVENOUS | Status: AC

## 2019-05-14 MED ORDER — PROTAMINE SULFATE 10 MG/ML IV SOLN
INTRAVENOUS | Status: AC
  Filled 2019-05-14: qty 25

## 2019-05-14 MED ORDER — METOPROLOL TARTRATE 5 MG/5ML IV SOLN: 2.5000 mg | INTRAVENOUS | Status: DC | PRN

## 2019-05-14 MED ORDER — PROPOFOL 10 MG/ML IV BOLUS
INTRAVENOUS | Status: AC
  Filled 2019-05-14: qty 20

## 2019-05-14 MED ORDER — MIDAZOLAM HCL 2 MG/2ML IJ SOLN: 2.0000 mg | INTRAMUSCULAR | Status: DC | PRN

## 2019-05-14 MED ORDER — ROCURONIUM BROMIDE 10 MG/ML (PF) SYRINGE
PREFILLED_SYRINGE | INTRAVENOUS | Status: DC | PRN
  Administered 2019-05-14: 30 mg via INTRAVENOUS
  Administered 2019-05-14: 100 mg via INTRAVENOUS
  Administered 2019-05-14: 50 mg via INTRAVENOUS
  Administered 2019-05-14: 30 mg via INTRAVENOUS

## 2019-05-14 MED ORDER — ISOSORBIDE MONONITRATE ER 30 MG PO TB24
15.0000 mg | ORAL_TABLET | Freq: Every day | ORAL | Status: DC
  Administered 2019-05-15 – 2019-05-19 (×5): 15 mg via ORAL
  Filled 2019-05-14 (×5): qty 1

## 2019-05-14 MED ORDER — LACTATED RINGERS IV SOLN: INTRAVENOUS | Status: DC | PRN

## 2019-05-14 MED ORDER — SODIUM CHLORIDE 0.9 % IV SOLN
1.5000 g | Freq: Two times a day (BID) | INTRAVENOUS | Status: DC
  Administered 2019-05-14 – 2019-05-15 (×3): 1.5 g via INTRAVENOUS
  Filled 2019-05-14 (×4): qty 1.5

## 2019-05-14 MED ORDER — METOCLOPRAMIDE HCL 5 MG/ML IJ SOLN
10.0000 mg | Freq: Four times a day (QID) | INTRAMUSCULAR | Status: AC
  Administered 2019-05-14 – 2019-05-19 (×20): 10 mg via INTRAVENOUS
  Filled 2019-05-14 (×20): qty 2

## 2019-05-14 SURGICAL SUPPLY — 112 items
ADAPTER CARDIO PERF ANTE/RETRO (ADAPTER) ×5 IMPLANT
APPLIER CLIP 9.375 SM OPEN (CLIP) ×5
BAG DECANTER FOR FLEXI CONT (MISCELLANEOUS) ×5 IMPLANT
BLADE CLIPPER SURG (BLADE) ×5 IMPLANT
BLADE STERNUM SYSTEM 6 (BLADE) ×5 IMPLANT
BLADE SURG 12 STRL SS (BLADE) ×5 IMPLANT
BLADE SURG 15 STRL LF DISP TIS (BLADE) ×6 IMPLANT
BLADE SURG 15 STRL SS (BLADE) ×4
BNDG ELASTIC 4X5.8 VLCR STR LF (GAUZE/BANDAGES/DRESSINGS) ×5 IMPLANT
BNDG ELASTIC 6X5.8 VLCR STR LF (GAUZE/BANDAGES/DRESSINGS) IMPLANT
BNDG GAUZE ELAST 4 BULKY (GAUZE/BANDAGES/DRESSINGS) ×5 IMPLANT
CANISTER SUCT 3000ML PPV (MISCELLANEOUS) ×5 IMPLANT
CANNULA GUNDRY RCSP 15FR (MISCELLANEOUS) ×5 IMPLANT
CATH CPB KIT VANTRIGT (MISCELLANEOUS) ×5 IMPLANT
CATH ROBINSON RED A/P 18FR (CATHETERS) ×15 IMPLANT
CATH THORACIC 28FR RT ANG (CATHETERS) ×5 IMPLANT
CLIP APPLIE 9.375 SM OPEN (CLIP) ×3 IMPLANT
CLIP RETRACTION 3.0MM CORONARY (MISCELLANEOUS) ×5 IMPLANT
CLIP VESOCCLUDE MED 24/CT (CLIP) IMPLANT
CLIP VESOCCLUDE SM WIDE 24/CT (CLIP) IMPLANT
COVER MAYO STAND STRL (DRAPES) ×5 IMPLANT
CUFF TOURN SGL QUICK 18X4 (TOURNIQUET CUFF) IMPLANT
CUFF TOURN SGL QUICK 24 (TOURNIQUET CUFF)
CUFF TRNQT CYL 24X4X16.5-23 (TOURNIQUET CUFF) IMPLANT
DRAIN CHANNEL 32F RND 10.7 FF (WOUND CARE) ×5 IMPLANT
DRAPE CARDIOVASCULAR INCISE (DRAPES) ×2
DRAPE EXTREMITY T 121X128X90 (DISPOSABLE) ×5 IMPLANT
DRAPE HALF SHEET 40X57 (DRAPES) ×5 IMPLANT
DRAPE SLUSH/WARMER DISC (DRAPES) ×5 IMPLANT
DRAPE SRG 135X102X78XABS (DRAPES) ×3 IMPLANT
DRSG AQUACEL AG ADV 3.5X14 (GAUZE/BANDAGES/DRESSINGS) ×5 IMPLANT
ELECT BLADE 4.0 EZ CLEAN MEGAD (MISCELLANEOUS) ×10
ELECT BLADE 6.5 EXT (BLADE) ×5 IMPLANT
ELECT CAUTERY BLADE 6.4 (BLADE) ×5 IMPLANT
ELECT REM PT RETURN 9FT ADLT (ELECTROSURGICAL) ×10
ELECTRODE BLDE 4.0 EZ CLN MEGD (MISCELLANEOUS) ×6 IMPLANT
ELECTRODE REM PT RTRN 9FT ADLT (ELECTROSURGICAL) ×6 IMPLANT
FELT TEFLON 1X6 (MISCELLANEOUS) ×10 IMPLANT
GAUZE SPONGE 4X4 12PLY STRL (GAUZE/BANDAGES/DRESSINGS) ×10 IMPLANT
GAUZE SPONGE 4X4 12PLY STRL LF (GAUZE/BANDAGES/DRESSINGS) ×10 IMPLANT
GEL ULTRASOUND 20GR AQUASONIC (MISCELLANEOUS) ×5 IMPLANT
GLOVE BIO SURGEON STRL SZ 6.5 (GLOVE) ×32 IMPLANT
GLOVE BIO SURGEON STRL SZ7 (GLOVE) ×5 IMPLANT
GLOVE BIO SURGEON STRL SZ7.5 (GLOVE) ×15 IMPLANT
GLOVE BIO SURGEONS STRL SZ 6.5 (GLOVE) ×8
GOWN STRL REUS W/ TWL LRG LVL3 (GOWN DISPOSABLE) ×24 IMPLANT
GOWN STRL REUS W/TWL LRG LVL3 (GOWN DISPOSABLE) ×16
HEMOSTAT POWDER SURGIFOAM 1G (HEMOSTASIS) ×15 IMPLANT
HEMOSTAT SURGICEL 2X14 (HEMOSTASIS) ×5 IMPLANT
INSERT FOGARTY XLG (MISCELLANEOUS) IMPLANT
KIT BASIN OR (CUSTOM PROCEDURE TRAY) ×5 IMPLANT
KIT SUCTION CATH 14FR (SUCTIONS) ×5 IMPLANT
KIT TURNOVER KIT B (KITS) ×5 IMPLANT
KIT VASOVIEW HEMOPRO 2 VH 4000 (KITS) IMPLANT
LEAD PACING MYOCARDI (MISCELLANEOUS) ×5 IMPLANT
MARKER GRAFT CORONARY BYPASS (MISCELLANEOUS) ×15 IMPLANT
NS IRRIG 1000ML POUR BTL (IV SOLUTION) ×30 IMPLANT
PACK E OPEN HEART (SUTURE) ×5 IMPLANT
PACK OPEN HEART (CUSTOM PROCEDURE TRAY) ×5 IMPLANT
PAD ARMBOARD 7.5X6 YLW CONV (MISCELLANEOUS) ×10 IMPLANT
PAD ELECT DEFIB RADIOL ZOLL (MISCELLANEOUS) ×5 IMPLANT
PENCIL BUTTON HOLSTER BLD 10FT (ELECTRODE) ×5 IMPLANT
POSITIONER HEAD DONUT 9IN (MISCELLANEOUS) ×5 IMPLANT
POWDER SURGICEL 3.0 GRAM (HEMOSTASIS) ×5 IMPLANT
PUNCH AORTIC ROTATE 4.0MM (MISCELLANEOUS) ×5 IMPLANT
PUNCH AORTIC ROTATE 4.5MM 8IN (MISCELLANEOUS) IMPLANT
PUNCH AORTIC ROTATE 5MM 8IN (MISCELLANEOUS) IMPLANT
SET CARDIOPLEGIA MPS 5001102 (MISCELLANEOUS) ×5 IMPLANT
SHEARS HARMONIC 9CM CVD (BLADE) ×5 IMPLANT
SPONGE LAP 18X18 RF (DISPOSABLE) ×5 IMPLANT
SPONGE LAP 18X18 X RAY DECT (DISPOSABLE) ×5 IMPLANT
SPONGE LAP 4X18 RFD (DISPOSABLE) ×5 IMPLANT
STAPLER VISISTAT 35W (STAPLE) ×5 IMPLANT
SUPPORT HEART JANKE-BARRON (MISCELLANEOUS) ×5 IMPLANT
SURGIFLO W/THROMBIN 8M KIT (HEMOSTASIS) ×5 IMPLANT
SUT BONE WAX W31G (SUTURE) ×5 IMPLANT
SUT MNCRL AB 4-0 PS2 18 (SUTURE) IMPLANT
SUT PROLENE 3 0 SH DA (SUTURE) IMPLANT
SUT PROLENE 3 0 SH1 36 (SUTURE) IMPLANT
SUT PROLENE 4 0 RB 1 (SUTURE) ×2
SUT PROLENE 4 0 SH DA (SUTURE) ×5 IMPLANT
SUT PROLENE 4-0 RB1 .5 CRCL 36 (SUTURE) ×3 IMPLANT
SUT PROLENE 5 0 C 1 36 (SUTURE) IMPLANT
SUT PROLENE 6 0 C 1 30 (SUTURE) ×5 IMPLANT
SUT PROLENE 6 0 CC (SUTURE) ×20 IMPLANT
SUT PROLENE 7.0 RB 3 (SUTURE) ×5 IMPLANT
SUT PROLENE 8 0 BV175 6 (SUTURE) ×25 IMPLANT
SUT PROLENE BLUE 7 0 (SUTURE) ×5 IMPLANT
SUT SILK  1 MH (SUTURE)
SUT SILK 1 MH (SUTURE) IMPLANT
SUT SILK 2 0 SH CR/8 (SUTURE) ×5 IMPLANT
SUT SILK 3 0 SH CR/8 (SUTURE) IMPLANT
SUT STEEL 6MS V (SUTURE) ×10 IMPLANT
SUT STEEL SZ 6 DBL 3X14 BALL (SUTURE) ×10 IMPLANT
SUT VIC AB 1 CTX 36 (SUTURE) ×2
SUT VIC AB 1 CTX36XBRD ANBCTR (SUTURE) ×3 IMPLANT
SUT VIC AB 2-0 CT1 27 (SUTURE) ×2
SUT VIC AB 2-0 CT1 TAPERPNT 27 (SUTURE) ×3 IMPLANT
SUT VIC AB 2-0 CTX 27 (SUTURE) ×5 IMPLANT
SUT VIC AB 3-0 SH 27 (SUTURE)
SUT VIC AB 3-0 SH 27X BRD (SUTURE) IMPLANT
SUT VIC AB 3-0 X1 27 (SUTURE) IMPLANT
SYR 50ML SLIP (SYRINGE) IMPLANT
SYSTEM SAHARA CHEST DRAIN ATS (WOUND CARE) ×5 IMPLANT
TAPE CLOTH SURG 4X10 WHT LF (GAUZE/BANDAGES/DRESSINGS) ×5 IMPLANT
TAPE PAPER 2X10 WHT MICROPORE (GAUZE/BANDAGES/DRESSINGS) ×5 IMPLANT
TOWEL GREEN STERILE (TOWEL DISPOSABLE) ×5 IMPLANT
TOWEL GREEN STERILE FF (TOWEL DISPOSABLE) ×5 IMPLANT
TRAY FOLEY SLVR 16FR TEMP STAT (SET/KITS/TRAYS/PACK) ×5 IMPLANT
TUBING LAP HI FLOW INSUFFLATIO (TUBING) IMPLANT
UNDERPAD 30X30 (UNDERPADS AND DIAPERS) ×5 IMPLANT
WATER STERILE IRR 1000ML POUR (IV SOLUTION) ×10 IMPLANT

## 2019-05-14 NOTE — Plan of Care (Signed)
°  Problem: Education: Goal: Knowledge of General Education information will improve Description: Including pain rating scale, medication(s)/side effects and non-pharmacologic comfort measures 05/14/2019 2154 by Melinda Crutch, RN Outcome: Progressing 05/14/2019 2154 by Melinda Crutch, RN Outcome: Progressing   Problem: Health Behavior/Discharge Planning: Goal: Ability to manage health-related needs will improve 05/14/2019 2154 by Melinda Crutch, RN Outcome: Progressing 05/14/2019 2154 by Melinda Crutch, RN Outcome: Progressing   Problem: Clinical Measurements: Goal: Ability to maintain clinical measurements within normal limits will improve 05/14/2019 2154 by Melinda Crutch, RN Outcome: Progressing 05/14/2019 2154 by Melinda Crutch, RN Outcome: Progressing Goal: Will remain free from infection 05/14/2019 2154 by Melinda Crutch, RN Outcome: Progressing 05/14/2019 2154 by Melinda Crutch, RN Outcome: Progressing Goal: Diagnostic test results will improve 05/14/2019 2154 by Melinda Crutch, RN Outcome: Progressing 05/14/2019 2154 by Melinda Crutch, RN Outcome: Progressing Goal: Respiratory complications will improve 05/14/2019 2154 by Melinda Crutch, RN Outcome: Progressing 05/14/2019 2154 by Melinda Crutch, RN Outcome: Progressing Goal: Cardiovascular complication will be avoided 05/14/2019 2154 by Melinda Crutch, RN Outcome: Progressing 05/14/2019 2154 by Melinda Crutch, RN Outcome: Progressing   Problem: Activity: Goal: Risk for activity intolerance will decrease 05/14/2019 2154 by Melinda Crutch, RN Outcome: Progressing 05/14/2019 2154 by Melinda Crutch, RN Outcome: Progressing   Problem: Nutrition: Goal: Adequate nutrition will be maintained 05/14/2019 2154 by Melinda Crutch, RN Outcome: Progressing 05/14/2019 2154 by Melinda Crutch, RN Outcome: Progressing   Problem: Coping: Goal: Level of anxiety will decrease 05/14/2019 2154 by Melinda Crutch, RN Outcome: Progressing 05/14/2019 2154 by Melinda Crutch, RN Outcome: Progressing   Problem: Elimination: Goal: Will not experience complications related to bowel motility 05/14/2019 2154 by Melinda Crutch, RN Outcome: Progressing 05/14/2019 2154 by Melinda Crutch, RN Outcome: Progressing Goal: Will not experience complications related to urinary retention 05/14/2019 2154 by Melinda Crutch, RN Outcome: Progressing 05/14/2019 2154 by Melinda Crutch, RN Outcome: Progressing   Problem: Pain Managment: Goal: General experience of comfort will improve 05/14/2019 2154 by Melinda Crutch, RN Outcome: Progressing 05/14/2019 2154 by Melinda Crutch, RN Outcome: Progressing   Problem: Safety: Goal: Ability to remain free from injury will improve 05/14/2019 2154 by Melinda Crutch, RN Outcome: Progressing 05/14/2019 2154 by Melinda Crutch, RN Outcome: Progressing   Problem: Skin Integrity: Goal: Risk for impaired skin integrity will decrease 05/14/2019 2154 by Melinda Crutch, RN Outcome: Progressing 05/14/2019 2154 by Melinda Crutch, RN Outcome: Progressing   Problem: Education: Goal: Will demonstrate proper wound care and an understanding of methods to prevent future damage Outcome: Progressing Goal: Knowledge of disease or condition will improve Outcome: Progressing Goal: Knowledge of the prescribed therapeutic regimen will improve Outcome: Progressing Goal: Individualized Educational Video(s) Outcome: Progressing   Problem: Activity: Goal: Risk for activity intolerance will decrease Outcome: Progressing   Problem: Cardiac: Goal: Will achieve and/or maintain hemodynamic stability Outcome: Progressing   Problem: Clinical Measurements: Goal: Postoperative complications will be avoided or minimized Outcome: Progressing   Problem: Respiratory: Goal: Respiratory status will improve Outcome: Progressing   Problem: Skin Integrity: Goal: Wound  healing without signs and symptoms of infection Outcome: Progressing Goal: Risk for impaired skin integrity will decrease Outcome: Progressing   Problem: Urinary Elimination: Goal: Ability to achieve and maintain adequate renal perfusion and functioning will improve Outcome: Progressing

## 2019-05-14 NOTE — Brief Op Note (Addendum)
05/11/2019 - 05/14/2019  11:23 AM  PATIENT:  Juan Payne  64 y.o. male  PRE-OPERATIVE DIAGNOSIS:  Coronary artery disease  POST-OPERATIVE DIAGNOSIS:  Coronary artery disease  PROCEDURE:  TRANSESOPHAGEAL ECHOCARDIOGRAM (TEE),MEDIAN STERNOTOMY for CORONARY ARTERY BYPASS GRAFTING (CABG) x TWO (LIMA to LAD, LEFT RADIAL ARTERY to OM2) USING LEFT INTERNAL MAMMARY ARTERY AND LEFT RADIAL ARTERY HARVEST NOTE: OM 3 too small to graft  SURGEON:  Surgeon(s) and Role:    Kerin Perna, MD - Primary  PHYSICIAN ASSISTANT: Doree Fudge PA-C  ASSISTANTS:Marie Francesca Jewett RNFA  ANESTHESIA:   general  EBL:  Per anesthesia and perfusion record  BLOOD ADMINISTERED:none. Only Albumin given  DRAINS: Chest tubes placed in the mediastinal and pleural spaces   COUNTS CORRECT:  YES  DICTATION: .Dragon Dictation  PLAN OF CARE: Admit to inpatient   PATIENT DISPOSITION:  ICU - intubated and hemodynamically stable.   Delay start of Pharmacological VTE agent (>24hrs) due to surgical blood loss or risk of bleeding: yes  BASELINE WEIGHT: 73.6 kg

## 2019-05-14 NOTE — Anesthesia Postprocedure Evaluation (Signed)
Anesthesia Post Note  Patient: Jaun Galluzzo [place]  Procedure(s) Performed: CORONARY ARTERY BYPASS GRAFTING (CABG) x TWO USING LEFT INTERNAL MAMMARY ARTERY AND LEFT RADIAL ARTERY (N/A Chest) RADIAL ARTERY HARVEST (Left Arm Lower) TRANSESOPHAGEAL ECHOCARDIOGRAM (TEE) (N/A )     Patient location during evaluation: SICU Anesthesia Type: General Level of consciousness: sedated and patient remains intubated per anesthesia plan Pain management: pain level controlled Vital Signs Assessment: post-procedure vital signs reviewed and stable Respiratory status: patient remains intubated per anesthesia plan and patient on ventilator - see flowsheet for VS Cardiovascular status: stable Anesthetic complications: no    Last Vitals:  Vitals:   05/14/19 1500 05/14/19 1600  BP:    Pulse: 83 80  Resp: 12 12  Temp: (!) 36.1 C 36.5 C  SpO2: 100% 100%    Last Pain:  Vitals:   05/14/19 0300  TempSrc:   PainSc: 0-No pain                 Lewie Loron

## 2019-05-14 NOTE — Anesthesia Procedure Notes (Signed)
Procedure Name: Intubation Date/Time: 05/14/2019 8:02 AM Performed by: Janene Harvey, CRNA Pre-anesthesia Checklist: Patient identified, Emergency Drugs available, Suction available and Patient being monitored Patient Re-evaluated:Patient Re-evaluated prior to induction Oxygen Delivery Method: Circle system utilized Preoxygenation: Pre-oxygenation with 100% oxygen Induction Type: IV induction Ventilation: Mask ventilation without difficulty and Oral airway inserted - appropriate to patient size Laryngoscope Size: Mac and 4 Grade View: Grade II Tube type: Oral Tube size: 8.0 mm Number of attempts: 1 Airway Equipment and Method: Stylet and Oral airway Placement Confirmation: ETT inserted through vocal cords under direct vision,  positive ETCO2 and breath sounds checked- equal and bilateral Secured at: 23 cm Tube secured with: Tape Dental Injury: Teeth and Oropharynx as per pre-operative assessment

## 2019-05-14 NOTE — Addendum Note (Signed)
Addendum  created 05/14/19 1910 by Audie Pinto, CRNA   Charge Capture section accepted

## 2019-05-14 NOTE — Transfer of Care (Signed)
Immediate Anesthesia Transfer of Care Note  Patient: Namon Villarin Apt  Procedure(s) Performed: CORONARY ARTERY BYPASS GRAFTING (CABG) x TWO USING LEFT INTERNAL MAMMARY ARTERY AND LEFT RADIAL ARTERY (N/A Chest) RADIAL ARTERY HARVEST (Left Arm Lower) TRANSESOPHAGEAL ECHOCARDIOGRAM (TEE) (N/A )  Patient Location: ICU  Anesthesia Type:General  Level of Consciousness: Patient remains intubated per anesthesia plan  Airway & Oxygen Therapy: Patient placed on Ventilator (see vital sign flow sheet for setting)  Post-op Assessment: Report given to RN and Post -op Vital signs reviewed and stable  Post vital signs: Reviewed  Last Vitals:  Vitals Value Taken Time  BP 126/59 05/14/19 1324  Temp 35.7 C 05/14/19 1330  Pulse 80 05/14/19 1330  Resp 12 05/14/19 1330  SpO2 99 % 05/14/19 1330  Vitals shown include unvalidated device data.  Last Pain:  Vitals:   05/14/19 0300  TempSrc:   PainSc: 0-No pain         Complications: No apparent anesthesia complications

## 2019-05-14 NOTE — Discharge Summary (Addendum)
Physician Discharge Summary       [address]       [place] [id number]             [phone]    Patient ID: Juan Payne MRN: [id number] DOB/AGE: 65-May-1957 65 y.o.  Admit date: 05/11/2019 Discharge date: 05/19/2019  Admission Diagnoses: 1. Unstable angina (HCC) 2. Coronary artery disease involving native heart   Discharge Diagnoses:  1.  S/P CABG x 2 2. Expected post op blood loss anemia 3. Post op a fib with RVR 4. History of essential hypertension 5. History of hyperlipidemia 6. History of near syncope 7. History of refusal of blood transfusions as patient is Jehovah's Witness    Consults: None  Procedure (s):  1.  Coronary artery bypass grafting x2 utilizing left internal mammary artery to left anterior descending and left radial artery free graft to circumflex marginal. 2.  Open harvest of left arm radial artery by Dr. Donata ClayVan Trigt on 05/14/2019.  History of Presenting Illness: This is a 64 year old male with a past medical history of hypertension and hyperlipidemia who is a Jevoha's Witness and refuses blood transfusions who [facility] on [facility]07/2019, after being evaluated at his cardiologist's office for chest pain/pressure (like 2 hands pushing on his chest), lightheadedness (flush like with near passing out feeling), and dizziness.  Patient has been experiencing chest pain, at rest and with activity, intermittently for about 3 weeks. He also has complaints of general malaise, but denied diaphoresis, nausea, or syncope. He did have at least one episode where the pain radiated down his left arm. Work up at [facility] included EKG which showed normal sinus rhythm with LAD, left anterior fascicular block, and LVH, labs near normal or normal, CTA which was negative for PE or other acute findng, CT of the head showed hypodensity within the left frontal white matter which may reflect microvascular ischemic changes with no  evidence of intracranial hemorrhage or large territory infarction. MRI Head showed no acute intracranial abnormalities but was noted to have chronic small vessel disease. Patient was given Nitro, put on Heparin drip, and cardiology was consulted. Echo showed LVEF 55-60%, no regional wall motion abnormalities, mild AS and trace to mild MR. Lexiscan Myoview done on 04/28 that was concerning for a small area of mild reversibility involving the distal lateral wall and mid inferolateral wall with normal LV wall motion and normal LVEF. It was recommended that patient be transferred [facility] for consideration of cardiac catheterization. Dr. Allyson SabalBerry did a cardiac catheterization earlier today and results showed proximal to mid LAD 89% stenosed, OM 2 90% stenosed, and OM3 95 and 99% stenoses with LVEF 55-65%. A cardiothoracic consultation has been requested with Dr. Donata ClayVan Trigt. At the time of exam, patient denies chest pressure or pain, shortness of breath, or dizziness. Dr. Donata ClayVan Trigt reviewed the images of the coronary angiogram and examined the patient. He discussed the need for coronary artery bypass grafting surgery. Potential risks, benefits, and complications of the surgery were discussed with the patient and he agreed to proceed with surgery. Pre operative carotid duplex US showed no significant internal carotid artery stenosis bilaterally. He underwent a CABGx 2 on 05/14/2019.  Brief Hospital Course:  The patient was extubated the evening of surgery without difficulty. He/she remained afebrile and hemodynamically stable. He was initially AAI paced. Theone MurdochSwan Ganz, a line, chest tubes, and foley were removed early in the post operative course. Lopressor was  started. He went into a fib with RVR the evening of 05/05. He was put on an Amiodarone drip and converted to sinus rhythm. He was volume over loaded and diuresed. He had expected ABL anemia.  Last H and H was 9.7 and 28.4 . He was weaned off the  insulin drip.   The patient's glucose remained well controlled.The patient's HGA1C pre op was  5.6. He was instructed to follow up with his medical doctor for further surveillance of HGA1C after discharge. The patient was felt surgically stable for transfer from the ICU to [facility] for further convalescence on 05/06 . He continues to progress with cardiac rehab. He was ambulating on room air. He has been tolerating a diet and has had a bowel movement. As of 05/07, he was maintaining sinus rhythm with heart in the 60-70's. Epicardial pacing wires were removed on 05/09. Chest tube sutures will be removed the day of discharge. The patient is felt surgically stable for discharge today.   Latest Vital Signs: Blood pressure (!) 153/108, pulse 68, temperature 98.6 F (37 C), temperature source Oral, resp. rate 13, height 5\' 9"  (1.753 m), weight 76.9 kg, SpO2 99 %.  Physical Exam: General appearance: alert, cooperative and no distress Heart: regular rate and rhythm Lungs: min dim in bases Abdomen: benign Extremities: no edema Wound: incis healing well   Discharge Condition: Stable and discharged to home.  Recent laboratory studies:  Lab Results  Component Value Date   WBC 9.7 05/17/2019   HGB 9.7 (L) 05/17/2019   HCT 28.4 (L) 05/17/2019   MCV 95.6 05/17/2019   PLT 150 05/17/2019   Lab Results  Component Value Date   NA 136 05/17/2019   K 4.1 05/17/2019   CL 101 05/17/2019   CO2 26 05/17/2019   CREATININE 0.75 05/17/2019   GLUCOSE 110 (H) 05/17/2019      Diagnostic Studies: DG Chest 2 View  Result Date: 05/18/2019 CLINICAL DATA:  Atelectasis.  Recent coronary artery bypass grafting EXAM: CHEST - 2 VIEW COMPARISON:  May 17, 2019 FINDINGS: Cordis has been removed. Temporary pacemaker wires are attached to the right heart. No pneumothorax. There is persistent atelectasis in the left base. There is no edema or airspace opacity. Heart is upper normal in size with pulmonary vascularity within  normal limits. Patient is status post median sternotomy. No bone lesions. IMPRESSION: Cordis has been removed. No pneumothorax. Left base atelectasis persists. No new opacity. Stable cardiac silhouette. Electronically Signed   By: May 19, 2019 III M.D.   On: 05/18/2019 09:26   DG Chest 2 View  Result Date: 05/13/2019 CLINICAL DATA:  Preop cardiovascular examination.  Hypertension. EXAM: CHEST - 2 VIEW COMPARISON:  05/07/2019 FINDINGS: Heart and mediastinal contours are within normal limits. No focal opacities or effusions. No acute bony abnormality. IMPRESSION: No active cardiopulmonary disease. Electronically Signed   By: 05/09/2019 M.D.   On: 05/13/2019 19:53   CARDIAC CATHETERIZATION  Result Date: 05/13/2019  2nd Mrg lesion is 90% stenosed.  3rd Mrg-1 lesion is 99% stenosed.  3rd Mrg-2 lesion is 95% stenosed.  Prox LAD to Mid LAD lesion is 90% stenosed.  Ost LAD to Prox LAD lesion is 60% stenosed.  The left ventricular systolic function is normal.  LV end diastolic pressure is normal.  The left ventricular ejection fraction is 55-65% by visual estimate.  MAYO FAULK is a 64 y.o. male  76 LOCATION:  FACILITY: [facility] PHYSICIAN: [id number], M.D. Sep 19, 1955 DATE OF PROCEDURE:  05/13/2019  DATE OF DISCHARGE: CARDIAC CATHETERIZATION History obtained from chart review.64 y.o. male with hypertension, hyperlipidemia, Jehovah's Witness, and family history of CAD here with unstable angina.   Mr. Szymczak has left dominant system with severe calcified vessels.  His entire proximal third of his LAD is calcified with diffuse 50 to 60% stenosis and 90% stenosis within this.  He had a high-grade small to medium size second marginal branch stenosis and a high-grade large ostial proximal and mid third obtuse marginal branch stenosis with normal LV function.  I believe the best revascularization strategy would be complete revascularization.  He is not diabetic would be a good candidate for all all  arterial conduit procedure if possible.  The sheath was removed and a TR band was placed on the right wrist to achieve patent hemostasis.  The patient left lab in stable condition.  He will be reheparinized 4 hours after sheath removal.  T CTS has been notified. [name]. MD, [facility] 05/13/2019 10:44 AM   DG CHEST PORT 1 VIEW  Result Date: 05/17/2019 CLINICAL DATA:  Pneumothorax EXAM: PORTABLE CHEST 1 VIEW COMPARISON:  05/16/2019 FINDINGS: Left basilar atelectasis. Right lung is clear. No pneumothorax is seen. Heart is normal in size. Postsurgical changes related to prior CABG. Right IJ venous sheath. Median sternotomy. IMPRESSION: Postsurgical changes related to prior CABG. Left basilar atelectasis.  No pneumothorax is seen. Electronically Signed   By: Charline Bills M.D.   On: 05/17/2019 08:28   DG Chest Port 1 View  Result Date: 05/16/2019 CLINICAL DATA:  Post CABG, history hypertension EXAM: PORTABLE CHEST 1 VIEW COMPARISON:  Portable exam 0558 hours compared to 05/15/2019 FINDINGS: Interval removal of Swan-Ganz catheter. RIGHT jugular line, mediastinal drain and LEFT thoracostomy tube remain. Normal heart size post CABG. Mediastinal contours and pulmonary vascularity normal. Decreased lung volumes with bibasilar atelectasis. No acute infiltrate, pleural effusion or pneumothorax. IMPRESSION: Decreased lung volumes with bibasilar atelectasis. Electronically Signed   By: Ulyses Southward M.D.   On: 05/16/2019 08:10   DG Chest Port 1 View  Result Date: 05/15/2019 CLINICAL DATA:  Post CABG EXAM: PORTABLE CHEST 1 VIEW COMPARISON:  05/14/2019 FINDINGS: Interval removal of endotracheal and enteric tubes. Right IJ Swan-Ganz catheter unchanged in positioning. Left chest tube and mediastinal drain unchanged. Intact sternotomy wires. Stable cardiomediastinal contours. Low lung volumes. Bibasilar atelectasis, left greater than right. No pneumothorax. IMPRESSION: 1. Interval removal of endotracheal and enteric tubes.  2. Low lung volumes with bibasilar atelectasis, left greater than right. Electronically Signed   By: Duanne Guess D.O.   On: 05/15/2019 09:31   DG Chest Port 1 View  Result Date: 05/14/2019 CLINICAL DATA:  Post CABG x2. EXAM: PORTABLE CHEST 1 VIEW COMPARISON:  05/13/2019 FINDINGS: Patient is slightly rotated to the left. Sternotomy wires intact. Endotracheal tube has tip approximately 3.8 cm above the carina. Nasogastric tube has tip over the stomach in the lateral left upper quadrant. Right IJ Swan-Ganz catheter has tip over the proximal right main pulmonary artery. Mediastinal drain and left-sided chest tube are present. Lungs are hypoinflated with very minimal bibasilar density likely atelectasis. No evidence of effusion or pneumothorax. Cardiomediastinal silhouette and remainder of the exam is unchanged. IMPRESSION: 1.  Minimal bibasilar atelectasis. 2.  Tubes and lines as described. Electronically Signed   By: Elberta Fortis M.D.   On: 05/14/2019 13:43   ECHOCARDIOGRAM COMPLETE  Result Date: 05/13/2019    ECHOCARDIOGRAM REPORT   Patient Name:   TONG PIECZYNSKI Date of Exam: 05/13/2019 Medical  Rec #:  [id number]       Height:       69.0 in Accession #:    [id number]      Weight:       164.0 lb Date of Birth:  1955/04/23      BSA:          1.899 m Patient Age:    63 years        BP:           155/88 mmHg Patient Gender: M               HR:           61 bpm. Exam Location:  Inpatient Procedure: 2D Echo Indications:    CAD Native Vessel I25.10  History:        Patient has no prior history of Echocardiogram examinations.                 Risk Factors:Hypertension and Dyslipidemia.  Sonographer:    Thurman Coyer RDCS (AE) Referring Phys: [id number] Faustino Congress NELSON IMPRESSIONS  1. Left ventricular ejection fraction, by estimation, is 45 to 50%. The left ventricle has mildly decreased function. The left ventricle demonstrates regional wall motion abnormalities (see scoring diagram/findings for description).  Left ventricular diastolic parameters are consistent with Grade I diastolic dysfunction (impaired relaxation).  2. Right ventricular systolic function is normal. The right ventricular size is normal.  3. The mitral valve is normal in structure. Mild mitral valve regurgitation. No evidence of mitral stenosis.  4. The aortic valve is tricuspid. Aortic valve regurgitation is not visualized. No aortic stenosis is present.  5. Aortic dilatation noted. 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  6. The inferior vena cava is normal in size with greater than 50% respiratory variability, suggesting right atrial pressure of 3 mmHg. FINDINGS  Left Ventricle: Left ventricular ejection fraction, by estimation, is 45 to 50%. The left ventricle has mildly decreased function. The left ventricle demonstrates regional wall motion abnormalities. The left ventricular internal cavity size was normal in size. There is no left ventricular hypertrophy. Left ventricular diastolic parameters are consistent with Grade I diastolic dysfunction (impaired relaxation).  LV Wall Scoring: The posterior wall is akinetic. Right Ventricle: The right ventricular size is normal. No increase in right ventricular wall thickness. Right ventricular systolic function is normal. Left Atrium: Left atrial size was normal in size. Right Atrium: Right atrial size was normal in size. Pericardium: There is no evidence of pericardial effusion. Mitral Valve: The mitral valve is normal in structure. Normal mobility of the mitral valve leaflets. Mild mitral valve regurgitation. No evidence of mitral valve stenosis. Tricuspid Valve: The tricuspid valve is normal in structure. Tricuspid valve regurgitation is not demonstrated. No evidence of tricuspid stenosis. Aortic Valve: The aortic valve is tricuspid. . There is mild thickening and mild calcification of the aortic valve. Aortic valve regurgitation is not visualized. No aortic stenosis is present.  There is mild thickening of the aortic valve. There is mild calcification of the aortic valve. Pulmonic Valve: The pulmonic valve was normal in structure. Pulmonic valve regurgitation is not visualized. No evidence of pulmonic stenosis. Aorta: Aortic dilatation noted. 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 Venous: The inferior vena cava is normal in size with greater than 50% respiratory variability, suggesting right atrial pressure of 3 mmHg. IAS/Shunts: No atrial level shunt detected by color flow Doppler.  LEFT VENTRICLE PLAX 2D  LVIDd:         5.30 cm  Diastology LVIDs:         4.10 cm  LV e' lateral:   7.87 cm/s LV PW:         1.00 cm  LV E/e' lateral: 8.8 LV IVS:        1.00 cm  LV e' medial:    6.08 cm/s LVOT diam:     2.30 cm  LV E/e' medial:  11.3 LV SV:         83 LV SV Index:   44 LVOT Area:     4.15 cm  RIGHT VENTRICLE RV S prime:     13.40 cm/s TAPSE (M-mode): 1.4 cm LEFT ATRIUM             Index       RIGHT ATRIUM           Index LA diam:        3.60 cm 1.90 cm/m  RA Area:     10.10 cm LA Vol (A2C):   26.7 ml 14.06 ml/m RA Volume:   15.40 ml  8.11 ml/m LA Vol (A4C):   36.6 ml 19.27 ml/m LA Biplane Vol: 33.2 ml 17.48 ml/m  AORTIC VALVE LVOT Vmax:   85.50 cm/s LVOT Vmean:  57.700 cm/s LVOT VTI:    0.200 m  AORTA Ao Root diam: 3.50 cm MITRAL VALVE MV Area (PHT): 3.08 cm    SHUNTS MV Decel Time: 246 msec    Systemic VTI:  0.20 m MV E velocity: 69.00 cm/s  Systemic Diam: 2.30 cm MV A velocity: 85.70 cm/s MV E/A ratio:  0.81 Donato Schultz MD Electronically signed by Donato Schultz MD Signature Date/Time: 05/13/2019/4:09:18 PM    Final    ECHO INTRAOPERATIVE TEE  Result Date: 05/14/2019  *INTRAOPERATIVE TRANSESOPHAGEAL REPORT *  Patient Name:   MATIX HENSHAW Date of Exam: 05/14/2019 Medical Rec #:  [id number]       Height:       69.0 in Accession #:    [id number]      Weight:       162.3 lb Date of Birth:  10-Oct-1955      BSA:          1.89 m Patient Age:    63 years        BP:            155/88 mmHg Patient Gender: M               HR:           60 bpm. Exam Location:  Anesthesiology Transesophogeal exam was perform intraoperatively during surgical procedure. Patient was closely monitored under general anesthesia during the entirety of examination. Indications:     CAD Native Vessel I25.10 Performing Phys: [id number] Patrina Andreas VAN TRIGT Diagnosing Phys: Lewie Loron MD Complications: No known complications during this procedure. POST-OP IMPRESSIONS - Left Ventricle: The left ventricle is unchanged from pre-bypass. - Right Ventricle: The right ventricle appears unchanged from pre-bypass. - Aorta: The aorta appears unchanged from pre-bypass. - Left Atrium: The left atrium appears unchanged from pre-bypass. - Left Atrial Appendage: The left atrial appendage appears unchanged from pre-bypass. - Aortic Valve: The aortic valve appears unchanged from pre-bypass. - Mitral Valve: The mitral valve appears unchanged from pre-bypass. - Tricuspid Valve: The tricuspid valve appears unchanged from pre-bypass. - Interatrial Septum: The interatrial septum appears unchanged from pre-bypass. - Interventricular Septum: The interventricular septum appears  unchanged from pre-bypass. - Pericardium: The pericardium appears unchanged from pre-bypass. PRE-OP FINDINGS  Left Ventricle: The left ventricle has low normal systolic function, with an ejection fraction of 50-55%. The cavity size was normal. There is mild concentric left ventricular hypertrophy. Right Ventricle: The right ventricle has normal systolic function. The cavity was normal. There is no increase in right ventricular wall thickness. Left Atrium: Left atrial size was normal in size.  Interatrial Septum: The interatrial septum was not assessed. Pericardium: There is no evidence of pericardial effusion. Mitral Valve: The mitral valve is normal in structure. Mild thickening of the mitral valve leaflet. Mitral valve regurgitation is mild by color flow Doppler. There  is No evidence of mitral stenosis. Tricuspid Valve: The tricuspid valve was normal in structure. Tricuspid valve regurgitation is mild by color flow Doppler. There is no evidence of tricuspid valve vegetation. Aortic Valve: The aortic valve is tricuspid Aortic valve regurgitation is trivial by color flow Doppler. There is no evidence of aortic valve stenosis. There is no stenosis of the aortic valve. There is no evidence of a vegetation on the aortic valve. Pulmonic Valve: The pulmonic valve was normal in structure. Pulmonic valve regurgitation is trivial by color flow Doppler. Aorta: There is evidence of atheroma immobile plaque in the descending aorta, ascending aorta and aortic arch; Grade I, measuring 1-70mm in size. +-------------+--------++ AORTIC VALVE          +-------------+--------++ AV Mean Grad:3.0 mmHg +-------------+--------++  Lewie Loron MD Electronically signed by Lewie Loron MD Signature Date/Time: 05/14/2019/1:07:22 PM    Final    VAS US DOPPLER PRE CABG  Result Date: 05/14/2019 PREOPERATIVE VASCULAR EVALUATION  Indications:      Pre-CABG. Risk Factors:     Hypertension, hyperlipidemia. Comparison Study: no prior Performing Technologist: Blanch Media RVS  Examination Guidelines: A complete evaluation includes B-mode imaging, spectral Doppler, color Doppler, and power Doppler as needed of all accessible portions of each vessel. Bilateral testing is considered an integral part of a complete examination. Limited examinations for reoccurring indications may be performed as noted.  Right Carotid Findings: +----------+--------+--------+--------+------------+--------+           PSV cm/sEDV cm/sStenosisDescribe    Comments +----------+--------+--------+--------+------------+--------+ CCA Prox  65      15              heterogenous         +----------+--------+--------+--------+------------+--------+ CCA Distal62      16              heterogenous          +----------+--------+--------+--------+------------+--------+ ICA Prox  57      19      1-39%   heterogenous         +----------+--------+--------+--------+------------+--------+ ICA Distal58      20                                   +----------+--------+--------+--------+------------+--------+ ECA       84      10                                   +----------+--------+--------+--------+------------+--------+ Portions of this table do not appear on this page. +----------+--------+-------+--------+------------+           PSV cm/sEDV cmsDescribeArm Pressure +----------+--------+-------+--------+------------+ Subclavian93                                  +----------+--------+-------+--------+------------+ +---------+--------+--+--------+--+---------+  VertebralPSV cm/s29EDV cm/s14Antegrade +---------+--------+--+--------+--+---------+ Left Carotid Findings: +----------+--------+--------+--------+------------+--------+           PSV cm/sEDV cm/sStenosisDescribe    Comments +----------+--------+--------+--------+------------+--------+ CCA Prox  92      19              heterogenous         +----------+--------+--------+--------+------------+--------+ CCA Distal73      22              heterogenous         +----------+--------+--------+--------+------------+--------+ ICA Prox  89      32      1-39%   heterogenous         +----------+--------+--------+--------+------------+--------+ ICA Distal59      32                                   +----------+--------+--------+--------+------------+--------+ ECA       105     15                                   +----------+--------+--------+--------+------------+--------+ +----------+--------+--------+--------+------------+ SubclavianPSV cm/sEDV cm/sDescribeArm Pressure +----------+--------+--------+--------+------------+           90                                    +----------+--------+--------+--------+------------+ +---------+--------+--+--------+--+---------+ VertebralPSV cm/s28EDV cm/s10Antegrade +---------+--------+--+--------+--+---------+  ABI Findings: +--------+------------------+-----+---------+--------+ Right   Rt Pressure (mmHg)IndexWaveform Comment  +--------+------------------+-----+---------+--------+ Brachial                       triphasic         +--------+------------------+-----+---------+--------+ ATA                            triphasic         +--------+------------------+-----+---------+--------+ PTA                            triphasic         +--------+------------------+-----+---------+--------+ +--------+------------------+-----+---------+-------+ Left    Lt Pressure (mmHg)IndexWaveform Comment +--------+------------------+-----+---------+-------+ Brachial                       triphasic        +--------+------------------+-----+---------+-------+ ATA                            triphasic        +--------+------------------+-----+---------+-------+ PTA                            triphasic        +--------+------------------+-----+---------+-------+  Right Doppler Findings: +--------+--------+-----+---------+--------+ Site    PressureIndexDoppler  Comments +--------+--------+-----+---------+--------+ Brachial             triphasic         +--------+--------+-----+---------+--------+ Radial               triphasic         +--------+--------+-----+---------+--------+ Ulnar                triphasic         +--------+--------+-----+---------+--------+  Left Doppler Findings: +--------+--------+-----+---------+--------+ Site  PressureIndexDoppler  Comments +--------+--------+-----+---------+--------+ Brachial             triphasic         +--------+--------+-----+---------+--------+ Radial               triphasic          +--------+--------+-----+---------+--------+ Ulnar                triphasic         +--------+--------+-----+---------+--------+  Summary: Right Carotid: Velocities in the right ICA are consistent with a 1-39% stenosis. Left Carotid: Velocities in the left ICA are consistent with a 1-39% stenosis. Vertebrals: Bilateral vertebral arteries demonstrate antegrade flow. Right Upper Extremity: Doppler waveforms remain within normal limits with right radial compression. Doppler waveforms remain within normal limits with right ulnar compression. Left Upper Extremity: Doppler waveforms remain within normal limits with left radial compression. Doppler waveform obliterate with left ulnar compression.  Electronically signed by Waverly Ferrari MD on 05/14/2019 at 1:10:57 PM.    Final        Discharge Instructions     Amb Referral to Cardiac Rehabilitation   Complete by: As directed    Diagnosis: CABG   CABG X ___: 2   After initial evaluation and assessments completed: Virtual Based Care may be provided alone or in conjunction with Phase 2 Cardiac Rehab based on patient barriers.: Yes   Discharge patient   Complete by: As directed    Discharge disposition: 01-Home or Self Care   Discharge patient date: 05/19/2019       Discharge Medications: Allergies as of 05/19/2019       Reactions   Morphine And Related Other (See Comments)   Drops BP         Medication List     STOP taking these medications    cloNIDine 0.1 MG tablet Commonly known as: CATAPRES   losartan-hydrochlorothiazide 100-25 MG tablet Commonly known as: HYZAAR       TAKE these medications    amiodarone 400 MG tablet Commonly known as: PACERONE Take 1 tablet (400 mg total) by mouth 2 (two) times daily. For 7 days, then convert to 400 mg once daily   aspirin 325 MG EC tablet Take 1 tablet (325 mg total) by mouth daily. What changed:  medication strength how much to take   atorvastatin 40 MG tablet Commonly  known as: LIPITOR Take 1 tablet (40 mg total) by mouth daily at 6 PM.   carvedilol 6.25 MG tablet Commonly known as: COREG Take 6.25 mg by mouth 2 (two) times daily.   citalopram 20 MG tablet Commonly known as: CELEXA Take 20 mg by mouth at bedtime.   ferrous fumarate-b12-vitamic C-folic acid capsule Commonly known as: TRINSICON / FOLTRIN Take 1 capsule by mouth 2 (two) times daily after a meal.   hydrOXYzine 25 MG tablet Commonly known as: ATARAX/VISTARIL Take 25 mg by mouth 3 (three) times daily.   isosorbide mononitrate 30 MG 24 hr tablet Commonly known as: IMDUR Take 0.5 tablets (15 mg total) by mouth daily.   traMADol 50 MG tablet Commonly known as: ULTRAM Take 1 tablet (50 mg total) by mouth every 6 (six) hours as needed for moderate pain.       The patient has been discharged on:   1.Beta Blocker:  Yes Cove.Etienne   ]  No   [   ]                              If No, reason:  2.Ace Inhibitor/ARB: Yes Blue.Reese   ]                                     No  [ ]                                      If No, reason:  3.Statin:   Yes [ y  ]                  No  [   ]                  If No, reason:  4.Ecasa:  Yes  [  y ]                  No   [   ]                  If No, reason:  Follow Up Appointments: Follow-up Information     Tobb, Kardie, DO. Go on 06/04/2019.   Specialty: Cardiology Why: Appointment time is at 9:00 am Contact information: [place] [address] [phone]         Ivin Poot, MD. Go on 06/19/2019.   Specialty: Cardiothoracic Surgery Why: PA/LAT CXR to be taken (at [facility] which is in the same building as Dr. Lucianne Lei Trigt's office) on 06/09 at 10:30 am;Appointment time is at 11:00 am Contact information: [address] [place] [address] [phone]         Street, Sharon Mt, MD. Call.   Specialty: Family Medicine Why: for a follow up appointment regrading further  surveillance of HGA1C 5.6 and arrang for a sleep study (may have obstructive sleep apnea) Contact information: [place] [address] [phone]            Signed: Gaspar Bidding 05/19/2019, 9:30 AM  DC instructions reviewed with patient  patient examined and medical record reviewed,agree with above note. Tharon Aquas Trigt III 05/20/2019

## 2019-05-14 NOTE — Procedures (Signed)
Extubation Procedure Note  Patient Details:   Name: Juan Payne DOB: 1955-04-23 MRN: [id number]   Airway Documentation:    Vent end date: 05/14/19 Vent end time: 1833   Evaluation  O2 sats: stable throughout Complications: No apparent complications Patient did tolerate procedure well. Bilateral Breath Sounds: Clear, Diminished   Yes   Pt extubated to 4L [place]. VC was 1L and NIF was greater than -20. Cuff leak was positive and no stridor noted.  Guss Bunde 05/14/2019, 6:33 PM

## 2019-05-14 NOTE — Discharge Instructions (Signed)

## 2019-05-14 NOTE — Anesthesia Procedure Notes (Signed)
Arterial Line Insertion Start/End5/04/2019 6:45 AM, 05/14/2019 6:50 AM Performed by: Audie Pinto, CRNA, CRNA  Patient location: Pre-op. Lidocaine 1% used for infiltration Right, radial was placed Catheter size: 20 G Hand hygiene performed  and maximum sterile barriers used  Allen's test indicative of satisfactory collateral circulation Attempts: 1 Procedure performed without using ultrasound guided technique. Following insertion, Biopatch and dressing applied. Post procedure assessment: unchanged  Patient tolerated the procedure well with no immediate complications.

## 2019-05-14 NOTE — Progress Notes (Signed)
RT attempted to wean pt. Pt has low VT's. Placed back to a rate of 12.

## 2019-05-14 NOTE — Progress Notes (Signed)
Pre-op notified of pts covid test result from [place]. Paper copy in pts chart.

## 2019-05-14 NOTE — Progress Notes (Signed)
  Echocardiogram Echocardiogram Transesophageal has been performed.  Juan Payne 05/14/2019, 8:24 AM 

## 2019-05-14 NOTE — Progress Notes (Signed)
Pre Procedure note for inpatients:   Juan Payne has been scheduled for Procedure(s): CORONARY ARTERY BYPASS GRAFTING (CABG) (N/A) RADIAL ARTERY HARVEST (Left) TRANSESOPHAGEAL ECHOCARDIOGRAM (TEE) (N/A) today. The various methods of treatment have been discussed with the patient. After consideration of the risks, benefits and treatment options the patient has consented to the planned procedure.   The patient has been seen and labs reviewed. There are no changes in the patients condition to prevent proceeding with the planned procedure today.  Recent labs:  Lab Results  Component Value Date   WBC 7.7 05/14/2019   HGB 14.3 05/14/2019   HCT 41.6 05/14/2019   PLT 211 05/14/2019   GLUCOSE 117 (H) 05/14/2019   CHOL 189 05/12/2019   TRIG 137 05/12/2019   HDL 51 05/12/2019   LDLCALC 111 (H) 05/12/2019   ALT 86 (H) 05/11/2019   AST 60 (H) 05/11/2019   NA 139 05/14/2019   K 3.7 05/14/2019   CL 102 05/14/2019   CREATININE 0.77 05/14/2019   BUN 14 05/14/2019   CO2 27 05/14/2019   TSH 6.549 (H) 05/14/2019   INR 1.1 05/14/2019   HGBA1C 5.6 05/14/2019    Mikey Bussing, MD 05/14/2019 7:37 AM

## 2019-05-14 NOTE — Progress Notes (Signed)
° °   °  [address]       [place] [address]             [phone]      S/p CABG x 2  Extubated  BP 91/69    Pulse 80    Temp 98.4 F (36.9 C)    Resp 17    Ht 5\' 9"  (1.753 m)    Wt 73.6 kg    SpO2 98%    BMI 23.96 kg/m  23/13 CI = 1.8  NTG @ 5, neo at 25  K= 5.0, Hct= 31  Doing well early postop  C. Viviann Spare, MD [facility] [phone]

## 2019-05-14 NOTE — Anesthesia Procedure Notes (Signed)
Central Venous Catheter Insertion Performed by: Lewie Loron, MD, anesthesiologist Patient location: Pre-op. Preanesthetic checklist: patient identified, IV checked, site marked, risks and benefits discussed, surgical consent, monitors and equipment checked, pre-op evaluation, timeout performed and anesthesia consent Position: Trendelenburg Lidocaine 1% used for infiltration and patient sedated Hand hygiene performed  and maximum sterile barriers used  Catheter size: 8.5 Fr Sheath introducer Swan type:thermodilution Procedure performed using ultrasound guided technique. Ultrasound Notes:anatomy identified, needle tip was noted to be adjacent to the nerve/plexus identified, no ultrasound evidence of intravascular and/or intraneural injection and image(s) printed for medical record Attempts: 1 Following insertion, line sutured, dressing applied and Biopatch. Post procedure assessment: blood return through all ports, free fluid flow and no air  Patient tolerated the procedure well with no immediate complications.

## 2019-05-14 NOTE — Progress Notes (Signed)
RT note- attempted to wean, patient is still sleepy with low VT, placed back to a rate of 12, continue to monitor.

## 2019-05-14 NOTE — Anesthesia Procedure Notes (Signed)
Central Venous Catheter Insertion Performed by: Lewie Loron, MD, anesthesiologist Patient location: Pre-op. Preanesthetic checklist: patient identified, IV checked, site marked, risks and benefits discussed, surgical consent, monitors and equipment checked, pre-op evaluation, timeout performed and anesthesia consent Hand hygiene performed  and maximum sterile barriers used  PA cath was placed.Swan type:thermodilution PA Cath depth:45 Procedure performed without using ultrasound guided technique. Attempts: 1 Post procedure assessment: no air and free fluid flow  Patient tolerated the procedure well with no immediate complications.

## 2019-05-15 ENCOUNTER — Inpatient Hospital Stay (HOSPITAL_COMMUNITY): Payer: 59

## 2019-05-15 LAB — BASIC METABOLIC PANEL
Anion gap: 13 (ref 5–15)
Anion gap: 11 (ref 5–15)
BUN: 13 mg/dL (ref 8–23)
BUN: 14 mg/dL (ref 8–23)
CO2: 21 mmol/L — ABNORMAL LOW (ref 22–32)
CO2: 24 mmol/L (ref 22–32)
Calcium: 8.3 mg/dL — ABNORMAL LOW (ref 8.9–10.3)
Calcium: 8.6 mg/dL — ABNORMAL LOW (ref 8.9–10.3)
Chloride: 106 mmol/L (ref 98–111)
Chloride: 103 mmol/L (ref 98–111)
Creatinine, Ser: 0.85 mg/dL (ref 0.61–1.24)
Creatinine, Ser: 1 mg/dL (ref 0.61–1.24)
GFR calc Af Amer: >60 mL/min (ref >60)
GFR calc Af Amer: >60 mL/min (ref >60)
GFR calc non Af Amer: >60 mL/min (ref >60)
GFR calc non Af Amer: >60 mL/min (ref >60)
Glucose, Bld: 136 mg/dL — ABNORMAL HIGH (ref 70–99)
Glucose, Bld: 146 mg/dL — ABNORMAL HIGH (ref 70–99)
Potassium: 4.3 mmol/L (ref 3.5–5.1)
Potassium: 4.2 mmol/L (ref 3.5–5.1)
Sodium: 140 mmol/L (ref 135–145)
Sodium: 138 mmol/L (ref 135–145)

## 2019-05-15 LAB — CBC
HCT: 33.6 % — ABNORMAL LOW (ref 39.0–52.0)
HCT: 33.4 % — ABNORMAL LOW (ref 39.0–52.0)
Hemoglobin: 11.4 g/dL — ABNORMAL LOW (ref 13.0–17.0)
Hemoglobin: 11.1 g/dL — ABNORMAL LOW (ref 13.0–17.0)
MCH: 33.2 pg (ref 26.0–34.0)
MCH: 32.4 pg (ref 26.0–34.0)
MCHC: 33.9 g/dL (ref 30.0–36.0)
MCHC: 33.2 g/dL (ref 30.0–36.0)
MCV: 98 fL (ref 80.0–100.0)
MCV: 97.4 fL (ref 80.0–100.0)
Platelets: 175 10*3/uL (ref 150–400)
Platelets: 174 10*3/uL (ref 150–400)
RBC: 3.43 MIL/uL — ABNORMAL LOW (ref 4.22–5.81)
RBC: 3.43 MIL/uL — ABNORMAL LOW (ref 4.22–5.81)
RDW: 12 % (ref 11.5–15.5)
RDW: 12.1 % (ref 11.5–15.5)
WBC: 13.2 10*3/uL — ABNORMAL HIGH (ref 4.0–10.5)
WBC: 15 10*3/uL — ABNORMAL HIGH (ref 4.0–10.5)
nRBC: 0 % (ref 0.0–0.2)
nRBC: 0 % (ref 0.0–0.2)

## 2019-05-15 LAB — MAGNESIUM
Magnesium: 2.4 mg/dL (ref 1.7–2.4)
Magnesium: 2.4 mg/dL (ref 1.7–2.4)

## 2019-05-15 LAB — GLUCOSE, CAPILLARY
Glucose-Capillary: 126 mg/dL — ABNORMAL HIGH (ref 70–99)
Glucose-Capillary: 133 mg/dL — ABNORMAL HIGH (ref 70–99)
Glucose-Capillary: 108 mg/dL — ABNORMAL HIGH (ref 70–99)
Glucose-Capillary: 92 mg/dL (ref 70–99)
Glucose-Capillary: 136 mg/dL — ABNORMAL HIGH (ref 70–99)
Glucose-Capillary: 139 mg/dL — ABNORMAL HIGH (ref 70–99)
Glucose-Capillary: 168 mg/dL — ABNORMAL HIGH (ref 70–99)

## 2019-05-15 MED ORDER — FE FUMARATE-B12-VIT C-FA-IFC PO CAPS
1.0000 | ORAL_CAPSULE | Freq: Two times a day (BID) | ORAL | Status: DC
  Administered 2019-05-15 – 2019-05-19 (×9): 1 via ORAL
  Filled 2019-05-15 (×9): qty 1

## 2019-05-15 MED ORDER — FUROSEMIDE 10 MG/ML IJ SOLN
20.0000 mg | Freq: Two times a day (BID) | INTRAMUSCULAR | Status: AC
  Administered 2019-05-15 (×2): 20 mg via INTRAVENOUS
  Filled 2019-05-15 (×2): qty 2

## 2019-05-15 MED ORDER — AMIODARONE HCL IN DEXTROSE 360-4.14 MG/200ML-% IV SOLN
60.0000 mg/h | INTRAVENOUS | Status: AC
  Administered 2019-05-15 (×2): 60 mg/h via INTRAVENOUS
  Filled 2019-05-15 (×2): qty 200

## 2019-05-15 MED ORDER — INSULIN ASPART 100 UNIT/ML ~~LOC~~ SOLN
0.0000 [IU] | SUBCUTANEOUS | Status: DC
  Administered 2019-05-15: 2 [IU] via SUBCUTANEOUS
  Administered 2019-05-15: 4 [IU] via SUBCUTANEOUS
  Administered 2019-05-15 – 2019-05-16 (×3): 2 [IU] via SUBCUTANEOUS

## 2019-05-15 MED ORDER — AMIODARONE LOAD VIA INFUSION
150.0000 mg | Freq: Once | INTRAVENOUS | Status: AC
  Administered 2019-05-15: 150 mg via INTRAVENOUS
  Filled 2019-05-15: qty 83.34

## 2019-05-15 MED ORDER — MIDAZOLAM HCL 2 MG/2ML IJ SOLN: 2.0000 mg | Freq: Four times a day (QID) | INTRAMUSCULAR | Status: DC | PRN

## 2019-05-15 MED ORDER — AMIODARONE HCL IN DEXTROSE 360-4.14 MG/200ML-% IV SOLN
30.0000 mg/h | INTRAVENOUS | Status: AC
  Administered 2019-05-16 (×2): 30 mg/h via INTRAVENOUS
  Filled 2019-05-15: qty 200

## 2019-05-15 NOTE — Progress Notes (Signed)
Patient found in Afib with rate of 120-130s. EKG obtained. Dr. Vickey Sages notified at bedside of rate change. See new orders (MAR). Verbal order also given to keep patient A paced at rate of 90 through epicardial wires.

## 2019-05-15 NOTE — Plan of Care (Signed)
  Problem: Clinical Measurements: Goal: Cardiovascular complication will be avoided Outcome: Progressing   Problem: Pain Managment: Goal: General experience of comfort will improve Outcome: Progressing   Problem: Cardiac: Goal: Will achieve and/or maintain hemodynamic stability Outcome: Progressing   Problem: Clinical Measurements: Goal: Postoperative complications will be avoided or minimized Outcome: Progressing   

## 2019-05-15 NOTE — Progress Notes (Signed)
TCTS Evening Rounds  POD #1 s/p CABG x 2 Stable day but in/out afib in the last hour Hemodyn stable Exam unremarkable A/p: amiodarone gtt orders written Adam Sanjuan Z. Vickey Sages, MD [phone]

## 2019-05-15 NOTE — Op Note (Signed)
NAME: MIGUELANGEL, KORN MEDICAL RECORD NL:[id number] ACCOUNT [id number] DATE OF BIRTH:Mar 17, 1955 FACILITY: [facility] LOCATION: [facility] PHYSICIAN:Loralye Loberg VAN TRIGT III, MD  OPERATIVE REPORT  DATE OF PROCEDURE:  05/14/2019  OPERATION: 1.  Coronary artery bypass grafting x2 utilizing left internal mammary artery to left anterior descending and left radial artery free graft to circumflex marginal. 2.  Open harvest of left arm radial artery.  SURGEON:  Kerin Perna, MD  ASSISTANT:  Doree Fudge, PA-C  PREOPERATIVE DIAGNOSIS:  Unstable angina with severe multivessel coronary artery disease.  POSTOPERATIVE DIAGNOSES:  Unstable angina with severe multivessel coronary artery disease.  ANESTHESIA:  General by Dr. 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  He was admitted to [facility] with mild  elevation of cardiac enzymes.  He had an echocardiogram and a chest CT scan, both of which were fairly unremarkable.  He was subsequently transferred to this hospital for cardiac catheterization which demonstrated severe 95% proximal LAD stenosis and  severe 95% proximal OM2 stenosis.  The OM1 branch also had some disease, but was very small.  The patient had a left dominant coronary circulation with a nondominant RCA without significant disease.  He was recommended for multivessel coronary bypass  grafting, and arterial grafts were recommended.  I discussed the procedure of CABG with the patient and his wife in detail.  I discussed the expected benefits as well as the alternatives to surgery and the risks involved.  He understood the major aspects of surgery including the use of general  anesthesia and cardiopulmonary bypass, the location of the surgical incisions, and the expected postoperative hospital recovery.  He  understood the risks of bleeding, stroke, MI, arrhythmia, pleural effusions, organ failure, and death.  It was his  specific instructions not to use blood products for the surgery due to his religious beliefs.  He understood that by not having the availability of blood products that his risk for surgery would be increased.  After reviewing all these issues, he  demonstrated his understanding for surgery and agreed to proceed with the operation as described above.  DESCRIPTION OF PROCEDURE:  The patient was brought directly from preoperative holding where the patient was examined, informed consent was documented, and all issues addressed with the patient's input.  The patient was placed supine on the operating  table and general anesthesia was induced.  He remained stable.  A transesophageal echo probe was placed by the anesthesia team, which showed no significant changes from the preoperative echo.  The patient was then prepped and draped as a sterile field in his chest, abdomen, legs and left arm.  A proper time-out was performed.  A sternal incision was made as the left forearm incision was made to harvest the left radial artery as a free graft.  There was a good distal pulse documented after occluding the radial artery with a soft vascular bulldog.  A sternal incision was made, and the sternum was retracted to expose the left internal mammary artery.  This was harvested as a pedicle graft from its origin at the subclavian vessels.  It was a 1.5 mm vessel with good flow.  The sternal retractor was then placed and the pericardium and was opened and suspended.  Pursestrings were placed in ascending aorta and right atrium.  After the radial artery had been harvested and flushed  and inspected and found to be adequate the left  arm was tucked to the patient's end-to-side sterilely.  Heparin was administered and after the ACT was documented as being therapeutic, the patient was cannulated and placed on  cardiopulmonary bypass.  The coronaries were identified for grafting.  The LAD had heavy proximal calcification and was small  distally, but the mid vessel was soft with adequate visibility.  The left OM1 was a small sub 1 mm vessel and not a target for grafting.  The 2nd OM was a larger significant vessel with heavy proximal calcifications and was a good target.  Cardioplegic cannulas were placed both antegrade and retrograde cold blood cardioplegia, and the patient was cooled to 32 degrees.  The mammary artery and vein and radial artery were prepared for the distal anastomoses.  The crossclamp was applied, and 1  L of cold blood cardioplegia was delivered in split doses between the antegrade aortic and retrograde coronary sinus catheters.  There was good cardioplegic arrest and supple temperature dropped less than 14 degrees.  Cardioplegia was delivered every 20  minutes.  The distal coronary anastomoses were performed.  The 1st distal anastomosis was the OM2 branch of the left coronary.  This was a 1.5 mm vessel, proximal 95% stenosis.  The radial artery sewn end-to-side with running 8-0 Prolene with good flow through the  graft.  Cardioplegia was redosed.  The 2nd distal anastomosis was the mid to distal 1/3 of the LAD.  It was a 1.6 mm vessel with proximal 95% stenosis.  The left IMA pedicle was brought through an opening and the left lateral pericardium and was brought down onto the LAD and sewn  end-to-side with running 8-0 Prolene.  There was good flow through the anastomosis after briefly releasing the pedicle bulldog on the mammary artery.  The bulldog was reapplied and the pedicle was secured to the epicardium with 6-0 Prolenes.   Cardioplegia was redosed.  While the cross clamp was still in place, the proximal radial artery graft was sewn to the side of the aorta with a 4.0 mm punch and running 7-0 Prolene.  Prior to tying down the final proximal anastomosis, air was vented from the  coronaries with a dose  of retrograde blood cardioplegia.  The crossclamp was then removed.  The heart resumed a spontaneous rhythm.  Both grafts were opened and found to have good flow with hemostasis documented at the proximal and distal anastomoses.  The patient was rewarmed and reperfused.  Temporary pacing wires were applied.  The lungs were expanded.  Ventilator was resumed.  The patient was prepared for transitioning off cardiopulmonary bypass.  This was accomplished without difficulty.  No  significant inotropic support was required.  The patient was A-paced with stable hemodynamics.  Echo showed preservation of good LV systolic function.  Test dose of protamine was administered without adverse reaction.  The protamine was then administered according to protocol.  The cannulas were removed.  The patient remained stable.  The superior pericardial fat was closed over the aorta and grafts.  Anterior mediastinum and left pleural chest tubes were placed and brought out through separate incisions.  The patient remained stable.  Hemostasis was adequate.  The sternum was closed with interrupted wire.  The pectoralis fascia was closed with a running #1 Vicryl.  Subcutaneous and skin layers were closed with running Vicryl and sterile dressings were  applied.  Total bypass time was 91 minutes.  CN/NUANCE  D:05/14/2019 T:05/15/2019 JOB:[id number]

## 2019-05-15 NOTE — Progress Notes (Addendum)
TCTS DAILY ICU PROGRESS NOTE                   [address]            [place] [address]          [phone]   1 Day Post-Op Procedure(s) (LRB): CORONARY ARTERY BYPASS GRAFTING (CABG) x TWO USING LEFT INTERNAL MAMMARY ARTERY AND LEFT RADIAL ARTERY (N/A) RADIAL ARTERY HARVEST (Left) TRANSESOPHAGEAL ECHOCARDIOGRAM (TEE) (N/A)  Total Length of Stay:  LOS: 4 days   Subjective: Patient awake, alert, has some incisional, sternal pain this am.  Objective: Vital signs in last 24 hours: Temp:  [96.4 F (35.8 C)-98.6 F (37 C)] 98.2 F (36.8 C) (05/05 0700) Pulse Rate:  [45-87] 83 (05/05 0700) Cardiac Rhythm: Atrial paced (05/05 0400) Resp:  [9-24] 22 (05/05 0700) BP: (83-126)/(59-82) 98/79 (05/05 0700) SpO2:  [95 %-100 %] 98 % (05/05 0700) Arterial Line BP: (72-127)/(49-71) 124/66 (05/05 0700) FiO2 (%):  [40 %-50 %] 40 % (05/04 1735) Weight:  [79.1 kg] 79.1 kg (05/05 0350)  Filed Weights   05/13/19 0359 05/14/19 0447 05/15/19 0350  Weight: 74.4 kg 73.6 kg 79.1 kg    Weight change: 5.5 kg   Hemodynamic parameters for last 24 hours: PAP: (3-37)/(-12-23) 28/15 CO:  [3.3 L/min-4.7 L/min] 4.7 L/min CI:  [1.7 L/min/m2-2.5 L/min/m2] 2.5 L/min/m2  Intake/Output from previous day: 05/04 0701 - 05/05 0700 In: 5603.5 [I.V.:3713.4; Blood:250; IV Piggyback:1640.1] Out: 4280 [Urine:3320; Blood:400; Chest Tube:560]  Intake/Output this shift: No intake/output data recorded.  Current Meds: Scheduled Meds: . acetaminophen  1,000 mg Oral Q6H   Or  . acetaminophen (TYLENOL) oral liquid 160 mg/5 mL  1,000 mg Per Tube Q6H  . aspirin EC  325 mg Oral Daily   Or  . aspirin  324 mg Per Tube Daily  . atorvastatin  40 mg Oral q1800  . bisacodyl  10 mg Oral Daily   Or  . bisacodyl  10 mg Rectal Daily  . Chlorhexidine Gluconate Cloth  6 each Topical Daily  . Chlorhexidine Gluconate Cloth  6 each Topical Daily  . citalopram  20 mg Oral QHS  . docusate sodium  200 mg  Oral Daily  . isosorbide mononitrate  15 mg Oral Daily  . metoCLOPramide (REGLAN) injection  10 mg Intravenous Q6H  . metoprolol tartrate  12.5 mg Oral BID   Or  . metoprolol tartrate  12.5 mg Per Tube BID  . [START ON 05/16/2019] pantoprazole  40 mg Oral Daily  . sodium chloride flush  3 mL Intravenous Q12H   Continuous Infusions: . sodium chloride 10 mL/hr at 05/15/19 0700  . sodium chloride    . sodium chloride 10 mL/hr at 05/14/19 1347  . albumin human 12.5 g (05/14/19 1752)  . cefUROXime (ZINACEF)  IV Stopped (05/14/19 2042)  . dexmedetomidine (PRECEDEX) IV infusion Stopped (05/14/19 1626)  . insulin 0.2 mL/hr at 05/15/19 0700  . lactated ringers    . lactated ringers Stopped (05/15/19 0352)  . nitroGLYCERIN 5 mcg/min (05/15/19 0700)  . phenylephrine (NEO-SYNEPHRINE) Adult infusion Stopped (05/15/19 0323)   PRN Meds:.sodium chloride, albumin human, dextrose, hydrOXYzine, metoprolol tartrate, midazolam, ondansetron (ZOFRAN) IV, oxyCODONE, sodium chloride flush, sodium chloride flush, traMADol  General appearance: alert, cooperative and no distress Neurologic: intact Heart: regular rate and rhythm  Lungs: Diminshed bibasilar breath sounds Abdomen: Soft, non tender, sporadic bowel sounds present Extremities: SCDs in place. Motor/sensory intact thumb/hand Wound: Aquacel and left forearm dressing  intact  Lab Results: CBC: Recent Labs    05/14/19 2000 05/15/19 0346  WBC 10.7* 13.2*  HGB 11.1* 11.4*  HCT 32.4* 33.6*  PLT 163 175   BMET:  Recent Labs    05/14/19 2000 05/15/19 0346  NA 139 140  K 4.8 4.3  CL 108 106  CO2 23 21*  GLUCOSE 123* 136*  BUN 13 13  CREATININE 0.75 0.85  CALCIUM 7.9* 8.3*    CMET: Lab Results  Component Value Date   WBC 13.2 (H) 05/15/2019   HGB 11.4 (L) 05/15/2019   HCT 33.6 (L) 05/15/2019   PLT 175 05/15/2019   GLUCOSE 136 (H) 05/15/2019   CHOL 189 05/12/2019   TRIG 137 05/12/2019   HDL 51 05/12/2019   LDLCALC 111 (H)  05/12/2019   ALT 86 (H) 05/11/2019   AST 60 (H) 05/11/2019   NA 140 05/15/2019   K 4.3 05/15/2019   CL 106 05/15/2019   CREATININE 0.85 05/15/2019   BUN 13 05/15/2019   CO2 21 (L) 05/15/2019   TSH 6.549 (H) 05/14/2019   INR 1.4 (H) 05/14/2019   HGBA1C 5.6 05/14/2019    PT/INR:  Recent Labs    05/14/19 1326  LABPROT 17.0*  INR 1.4*   Radiology: [facility] Chest Port 1 View  Result Date: 05/14/2019 CLINICAL DATA:  Post CABG x2. EXAM: PORTABLE CHEST 1 VIEW COMPARISON:  05/13/2019 FINDINGS: Patient is slightly rotated to the left. Sternotomy wires intact. Endotracheal tube has tip approximately 3.8 cm above the carina. Nasogastric tube has tip over the stomach in the lateral left upper quadrant. Right IJ Swan-Ganz catheter has tip over the proximal right main pulmonary artery. Mediastinal drain and left-sided chest tube are present. Lungs are hypoinflated with very minimal bibasilar density likely atelectasis. No evidence of effusion or pneumothorax. Cardiomediastinal silhouette and remainder of the exam is unchanged. IMPRESSION: 1.  Minimal bibasilar atelectasis. 2.  Tubes and lines as described. Electronically Signed   By: Marin Olp M.D.   On: 05/14/2019 13:43     Assessment/Plan: S/P Procedure(s) (LRB): CORONARY ARTERY BYPASS GRAFTING (CABG) x TWO USING LEFT INTERNAL MAMMARY ARTERY AND LEFT RADIAL ARTERY (N/A) RADIAL ARTERY HARVEST (Left) TRANSESOPHAGEAL ECHOCARDIOGRAM (TEE) (N/A)   1. CV-A paced this am. On Nitro drip, Lopressor 12.5 mg bid, Imdur 15 mg daily. Will transition off Nitro (on for left radial artery harvest).  2. Pulmonary-on 4 L via [place]. Will wean as able over next few days. Chest tubes with 560 cc since surgery. CXR this am appears stable (no pneumothorax, small pleural effusions, bibasilar atelectasis). Will leave chest tubes for now but may be able to remove mediastinal and monitor pleural. Encourage incentive spirometer. 3. Volume overload-will give Lasix 20 mg IV  bid 4. Expected post op blood loss anemia-H and H this am 11.4 and 33.6 5. CBGs 133/109/92. Pre op HGA1C 5.6. Transition off Insulin drip. 6. Please see progression orders   Donielle Liston Alba PA-C 05/15/2019    Agree with above assessment and plan Doing well after CABG with arterial conduit Will DC chest tubes after OOB to chair  patient examined and medical record reviewed,agree with above note. Tharon Aquas Trigt III 05/15/2019

## 2019-05-16 ENCOUNTER — Inpatient Hospital Stay (HOSPITAL_COMMUNITY): Payer: 59

## 2019-05-16 DIAGNOSIS — R55 Syncope and collapse: Secondary | ICD-10-CM | POA: Diagnosis not present

## 2019-05-16 DIAGNOSIS — I480 Paroxysmal atrial fibrillation: Secondary | ICD-10-CM

## 2019-05-16 DIAGNOSIS — I10 Essential (primary) hypertension: Secondary | ICD-10-CM | POA: Diagnosis not present

## 2019-05-16 DIAGNOSIS — I25110 Atherosclerotic heart disease of native coronary artery with unstable angina pectoris: Secondary | ICD-10-CM | POA: Diagnosis not present

## 2019-05-16 LAB — CBC
HCT: 31.8 % — ABNORMAL LOW (ref 39.0–52.0)
Hemoglobin: 10.5 g/dL — ABNORMAL LOW (ref 13.0–17.0)
MCH: 32 pg (ref 26.0–34.0)
MCHC: 33 g/dL (ref 30.0–36.0)
MCV: 97 fL (ref 80.0–100.0)
Platelets: 143 10*3/uL — ABNORMAL LOW (ref 150–400)
RBC: 3.28 MIL/uL — ABNORMAL LOW (ref 4.22–5.81)
RDW: 12 % (ref 11.5–15.5)
WBC: 12.3 10*3/uL — ABNORMAL HIGH (ref 4.0–10.5)
nRBC: 0 % (ref 0.0–0.2)

## 2019-05-16 LAB — BASIC METABOLIC PANEL
Anion gap: 5 (ref 5–15)
BUN: 17 mg/dL (ref 8–23)
CO2: 27 mmol/L (ref 22–32)
Calcium: 8.3 mg/dL — ABNORMAL LOW (ref 8.9–10.3)
Chloride: 103 mmol/L (ref 98–111)
Creatinine, Ser: 0.9 mg/dL (ref 0.61–1.24)
GFR calc Af Amer: >60 mL/min (ref >60)
GFR calc non Af Amer: >60 mL/min (ref >60)
Glucose, Bld: 142 mg/dL — ABNORMAL HIGH (ref 70–99)
Potassium: 4.1 mmol/L (ref 3.5–5.1)
Sodium: 135 mmol/L (ref 135–145)

## 2019-05-16 LAB — GLUCOSE, CAPILLARY
Glucose-Capillary: 133 mg/dL — ABNORMAL HIGH (ref 70–99)
Glucose-Capillary: 133 mg/dL — ABNORMAL HIGH (ref 70–99)

## 2019-05-16 MED ORDER — METOPROLOL TARTRATE 25 MG/10 ML ORAL SUSPENSION
12.5000 mg | Freq: Two times a day (BID) | ORAL | Status: DC
  Filled 2019-05-16 (×7): qty 5

## 2019-05-16 MED ORDER — FENTANYL CITRATE (PF) 100 MCG/2ML IJ SOLN: 25.0000 ug | Freq: Four times a day (QID) | INTRAMUSCULAR | Status: DC | PRN

## 2019-05-16 MED ORDER — AMIODARONE HCL 200 MG PO TABS
400.0000 mg | ORAL_TABLET | Freq: Two times a day (BID) | ORAL | Status: DC
  Administered 2019-05-16 (×2): 400 mg via ORAL
  Filled 2019-05-16 (×2): qty 2

## 2019-05-16 MED ORDER — AMIODARONE IV BOLUS ONLY 150 MG/100ML
150.0000 mg | Freq: Once | INTRAVENOUS | Status: AC
  Administered 2019-05-16: 150 mg via INTRAVENOUS
  Filled 2019-05-16: qty 100

## 2019-05-16 MED ORDER — FUROSEMIDE 10 MG/ML IJ SOLN
20.0000 mg | Freq: Two times a day (BID) | INTRAMUSCULAR | Status: AC
  Administered 2019-05-16 (×2): 20 mg via INTRAVENOUS
  Filled 2019-05-16 (×2): qty 2

## 2019-05-16 MED ORDER — POTASSIUM CHLORIDE CRYS ER 20 MEQ PO TBCR
20.0000 meq | EXTENDED_RELEASE_TABLET | Freq: Once | ORAL | Status: AC
  Administered 2019-05-16: 20 meq via ORAL
  Filled 2019-05-16: qty 1

## 2019-05-16 MED ORDER — METOPROLOL TARTRATE 12.5 MG HALF TABLET
12.5000 mg | ORAL_TABLET | Freq: Two times a day (BID) | ORAL | Status: DC
  Administered 2019-05-16 – 2019-05-19 (×6): 12.5 mg via ORAL
  Filled 2019-05-16 (×6): qty 1

## 2019-05-16 NOTE — Progress Notes (Signed)
Pt noted to be in afib rhythm with heart rate ranging from 90 to low 120's.  Pt unaware.  MD paged.  Orders received to turn off pt's external pacing box and leave it attached to patient.  Orders received to administer amiodarone 150 mg  IV bolus as well as report to night shift nurse to give pm amiodarone 400 mg po a little early (right after shift change).  Pacer box turned off and amio bolus administered.  Will continue to monitor.

## 2019-05-16 NOTE — Progress Notes (Signed)
Progress Note  Patient Name: Juan Payne Date of Encounter: 05/16/2019  Primary Cardiologist: Berniece Salines, DO   Subjective   Feeling well.  Expected incisional chest tenderness that is manageable.   Inpatient Medications    Scheduled Meds: . acetaminophen  1,000 mg Oral Q6H   Or  . acetaminophen (TYLENOL) oral liquid 160 mg/5 mL  1,000 mg Per Tube Q6H  . amiodarone  400 mg Oral BID  . aspirin EC  325 mg Oral Daily   Or  . aspirin  324 mg Per Tube Daily  . atorvastatin  40 mg Oral q1800  . bisacodyl  10 mg Oral Daily   Or  . bisacodyl  10 mg Rectal Daily  . Chlorhexidine Gluconate Cloth  6 each Topical Daily  . Chlorhexidine Gluconate Cloth  6 each Topical Daily  . citalopram  20 mg Oral QHS  . docusate sodium  200 mg Oral Daily  . ferrous [id number] C-folic acid  1 capsule Oral BID PC  . furosemide  20 mg Intravenous BID  . isosorbide mononitrate  15 mg Oral Daily  . metoCLOPramide (REGLAN) injection  10 mg Intravenous Q6H  . metoprolol tartrate  12.5 mg Oral BID   Or  . metoprolol tartrate  12.5 mg Per Tube BID  . pantoprazole  40 mg Oral Daily   Continuous Infusions: . sodium chloride 10 mL/hr at 05/14/19 1347  . lactated ringers     PRN Meds: fentaNYL (SUBLIMAZE) injection, hydrOXYzine, metoprolol tartrate, ondansetron (ZOFRAN) IV, oxyCODONE, sodium chloride flush, sodium chloride flush, traMADol   Vital Signs    Vitals:   05/16/19 0900 05/16/19 1000 05/16/19 1100 05/16/19 1147  BP: 118/79 118/81 113/85   Pulse: 80 79 80   Resp: (!) 24 (!) 28 (!) 24   Temp:    98.1 F (36.7 C)  TempSrc:    Oral  SpO2: 92% 98% 96%   Weight:      Height:        Intake/Output Summary (Last 24 hours) at 05/16/2019 1154 Last data filed at 05/16/2019 1030 Gross per 24 hour  Intake 511.58 ml  Output 1755 ml  Net -1243.42 ml   Last 3 Weights 05/16/2019 05/15/2019 05/14/2019  Weight (lbs) 172 lb 9.9 oz 174 lb 6.1 oz 162 lb 4.1 oz  Weight (kg) 78.3 kg 79.1 kg 73.6  kg      Telemetry    Atrial fibrillation now ASVP- Personally Reviewed  ECG    05/12/2019: Sinus bradycardia.  Rate 51 bpm.  LAFB.  LVH. - Personally Reviewed 05/15/19: Atrial fibrillation.  Rate 115 bpm.  LAFB  Physical Exam   VS:  BP 113/85   Pulse 80   Temp 98.1 F (36.7 C) (Oral)   Resp (!) 24   Ht 5\' 9"  (1.753 m)   Wt 78.3 kg   SpO2 96%   BMI 25.49 kg/m  , BMI Body mass index is 25.49 kg/m. GENERAL:  Well appearing.  No acute distress. HEENT: Pupils equal round and reactive, fundi not visualized, oral mucosa unremarkable NECK:  No jugular venous distention, waveform within normal limits, carotid upstroke brisk and symmetric, no bruits LUNGS:  Clear to auscultation bilaterally CHEST: Midline incision HEART:  RRR.  PMI not displaced or sustained,S1 and S2 within normal limits, no S3, no S4, no clicks, no rubs, no murmurs ABD:  Flat, positive bowel sounds normal in frequency in pitch, no bruits, no rebound, no guarding, no midline pulsatile mass, no hepatomegaly, no  splenomegaly EXT:  2 plus pulses throughout, no edema, no cyanosis no clubbing SKIN:  No rashes no nodules NEURO:  Cranial nerves II through XII grossly intact, motor grossly intact throughout PSYCH:  Cognitively intact, oriented to person place and time   Labs    High Sensitivity Troponin:  No results for input(s): TROPONINIHS in the last 720 hours.    Chemistry Recent Labs  Lab 05/11/19 0454 05/12/19 0216 05/15/19 0346 05/15/19 1650 05/16/19 0210  NA 138   < > 140 138 135  K 3.8   < > 4.3 4.2 4.1  CL 101   < > 106 103 103  CO2 24   < > 21* 24 27  GLUCOSE 103*   < > 136* 146* 142*  BUN 16   < > 13 14 17   CREATININE 0.83   < > 0.85 1.00 0.90  CALCIUM 9.6   < > 8.3* 8.6* 8.3*  PROT 7.0  --   --   --   --   ALBUMIN 4.1  --   --   --   --   AST 60*  --   --   --   --   ALT 86*  --   --   --   --   ALKPHOS 40  --   --   --   --   BILITOT 0.7  --   --   --   --   GFRNONAA >60   < > >60 >60 >60   GFRAA >60   < > >60 >60 >60  ANIONGAP 13   < > 13 11 5    < > = values in this interval not displayed.     Hematology Recent Labs  Lab 05/15/19 0346 05/15/19 1650 05/16/19 0210  WBC 13.2* 15.0* 12.3*  RBC 3.43* 3.43* 3.28*  HGB 11.4* 11.1* 10.5*  HCT 33.6* 33.4* 31.8*  MCV 98.0 97.4 97.0  MCH 33.2 32.4 32.0  MCHC 33.9 33.2 33.0  RDW 12.0 12.1 12.0  PLT 175 174 143*    BNPNo results for input(s): BNP, PROBNP in the last 168 hours.   DDimer No results for input(s): DDIMER in the last 168 hours.   Radiology    DG Chest Port 1 View  Result Date: 05/16/2019 CLINICAL DATA:  Post CABG, history hypertension EXAM: PORTABLE CHEST 1 VIEW COMPARISON:  Portable exam 0558 hours compared to 05/15/2019 FINDINGS: Interval removal of Swan-Ganz catheter. RIGHT jugular line, mediastinal drain and LEFT thoracostomy tube remain. Normal heart size post CABG. Mediastinal contours and pulmonary vascularity normal. Decreased lung volumes with bibasilar atelectasis. No acute infiltrate, pleural effusion or pneumothorax. IMPRESSION: Decreased lung volumes with bibasilar atelectasis. Electronically Signed   By: 07/16/2019 M.D.   On: 05/16/2019 08:10   DG Chest Port 1 View  Result Date: 05/15/2019 CLINICAL DATA:  Post CABG EXAM: PORTABLE CHEST 1 VIEW COMPARISON:  05/14/2019 FINDINGS: Interval removal of endotracheal and enteric tubes. Right IJ Swan-Ganz catheter unchanged in positioning. Left chest tube and mediastinal drain unchanged. Intact sternotomy wires. Stable cardiomediastinal contours. Low lung volumes. Bibasilar atelectasis, left greater than right. No pneumothorax. IMPRESSION: 1. Interval removal of endotracheal and enteric tubes. 2. Low lung volumes with bibasilar atelectasis, left greater than right. Electronically Signed   By: 07/15/2019 D.O.   On: 05/15/2019 09:31   DG Chest Port 1 View  Result Date: 05/14/2019 CLINICAL DATA:  Post CABG x2. EXAM: PORTABLE CHEST 1 VIEW COMPARISON:   05/13/2019 FINDINGS:  Patient is slightly rotated to the left. Sternotomy wires intact. Endotracheal tube has tip approximately 3.8 cm above the carina. Nasogastric tube has tip over the stomach in the lateral left upper quadrant. Right IJ Swan-Ganz catheter has tip over the proximal right main pulmonary artery. Mediastinal drain and left-sided chest tube are present. Lungs are hypoinflated with very minimal bibasilar density likely atelectasis. No evidence of effusion or pneumothorax. Cardiomediastinal silhouette and remainder of the exam is unchanged. IMPRESSION: 1.  Minimal bibasilar atelectasis. 2.  Tubes and lines as described. Electronically Signed   By: Elberta Fortis M.D.   On: 05/14/2019 13:43    Cardiac Studies   Echo 05/13/19:   1. Left ventricular ejection fraction, by estimation, is 45 to 50%. The  left ventricle has mildly decreased function. The left ventricle  demonstrates regional wall motion abnormalities (see scoring  diagram/findings for description). Left ventricular  diastolic parameters are consistent with Grade I diastolic dysfunction  (impaired relaxation).  2. Right ventricular systolic function is normal. The right ventricular  size is normal.  3. The mitral valve is normal in structure. Mild mitral valve  regurgitation. No evidence of mitral stenosis.  4. The aortic valve is tricuspid. Aortic valve regurgitation is not  visualized. No aortic stenosis is present.  5. Aortic dilatation noted. There is mild dilatation of the ascending  aorta measuring 38 mm.  6. The inferior vena cava is normal in size with greater than 50%  respiratory variability, suggesting right atrial pressure of 3 mmHg.   LHC 05/13/19:  2nd Mrg lesion is 90% stenosed.  3rd Mrg-1 lesion is 99% stenosed.  3rd Mrg-2 lesion is 95% stenosed.  Prox LAD to Mid LAD lesion is 90% stenosed.  Ost LAD to Prox LAD lesion is 60% stenosed.  The left ventricular systolic function is normal.  LV  end diastolic pressure is normal.  The left ventricular ejection fraction is 55-65% by visual estimate.  Patient Profile     65 y.o. 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  Assessment & Plan    # Unstable angina:  #Coronary calcification: Underwent successful CABG with Dr. Donata Clay on 05/15/2019 (LIMA to LAD, left radial artery to left circumflex).  He is doing well.  He did have some postoperative atrial fibrillation and is now back in sinus rhythm on amiodarone.  He is otherwise stable and progressing well.  Continue aspirin and atorvastatin.  Continue Imdur.  Metoprolol to be initiated today.  # Essential hypertension:   At home he was on HCTZ and clonidine.  These have been held.  Starting metoprolol today as above.      # Postoperative atrial fibrillation: Continue amiodarone and metoprolol as above.  He is a Scientist, product/process development.  If he maintains sinus rhythm the rest of this admission, likely would not recommend starting anticoagulation at this time.  For questions or updates, please contact [facility] Please consult [email or web address] for contact info under        Signed, Chilton Si, MD  05/16/2019, 11:54 AM

## 2019-05-16 NOTE — Progress Notes (Addendum)
[facility] DAILY ICU PROGRESS NOTE                   [address]            [place] [address]          [phone]   2 Days Post-Op Procedure(s) (LRB): CORONARY ARTERY BYPASS GRAFTING (CABG) x TWO USING LEFT INTERNAL MAMMARY ARTERY AND LEFT RADIAL ARTERY (N/A) RADIAL ARTERY HARVEST (Left) TRANSESOPHAGEAL ECHOCARDIOGRAM (TEE) (N/A)  Total Length of Stay:  LOS: 5 days   Subjective: Patient sitting in chair this am and he has already walked. He has no specific complaint this am.  Objective: Vital signs in last 24 hours: Temp:  [97.7 F (36.5 C)-98.8 F (37.1 C)] 98.1 F (36.7 C) (05/06 0300) Pulse Rate:  [69-114] 89 (05/06 0500) Cardiac Rhythm: Atrial fibrillation;Atrial paced (05/06 0400) Resp:  [12-24] 19 (05/06 0500) BP: (84-123)/(61-83) 103/76 (05/06 0500) SpO2:  [88 %-98 %] 95 % (05/06 0500) Arterial Line BP: (124)/(66) 124/66 (05/05 0700) Weight:  [78.3 kg] 78.3 kg (05/06 0500)  Filed Weights   05/14/19 0447 05/15/19 0350 05/16/19 0500  Weight: 73.6 kg 79.1 kg 78.3 kg    Weight change: -0.8 kg   Hemodynamic parameters for last 24 hours: PAP: (28-37)/(15-23) 36/23  Intake/Output from previous day: 05/05 0701 - 05/06 0700 In: 237.3 [I.V.:37.3; IV Piggyback:200] Out: 1570 [Urine:1250; Chest Tube:320]  Intake/Output this shift: Total I/O In: 125.8 [I.V.:25.8; IV Piggyback:100] Out: 545 [Urine:425; Chest Tube:120]  Current Meds: Scheduled Meds: . acetaminophen  1,000 mg Oral Q6H   Or  . acetaminophen (TYLENOL) oral liquid 160 mg/5 mL  1,000 mg Per Tube Q6H  . aspirin EC  325 mg Oral Daily   Or  . aspirin  324 mg Per Tube Daily  . atorvastatin  40 mg Oral q1800  . bisacodyl  10 mg Oral Daily   Or  . bisacodyl  10 mg Rectal Daily  . Chlorhexidine Gluconate Cloth  6 each Topical Daily  . Chlorhexidine Gluconate Cloth  6 each Topical Daily  . citalopram  20 mg Oral QHS  . docusate sodium  200 mg Oral Daily  . ferrous fumarate-b12-vitamic  C-folic acid  1 capsule Oral BID PC  . insulin aspart  0-24 Units Subcutaneous Q4H  . isosorbide mononitrate  15 mg Oral Daily  . metoCLOPramide (REGLAN) injection  10 mg Intravenous Q6H  . metoprolol tartrate  12.5 mg Oral BID   Or  . metoprolol tartrate  12.5 mg Per Tube BID  . pantoprazole  40 mg Oral Daily   Continuous Infusions: . sodium chloride 10 mL/hr at 05/14/19 1347  . amiodarone 30 mg/hr (05/16/19 0515)  . cefUROXime (ZINACEF)  IV Stopped (05/15/19 2035)  . lactated ringers    . lactated ringers Stopped (05/15/19 0352)   PRN Meds:.hydrOXYzine, metoprolol tartrate, ondansetron (ZOFRAN) IV, oxyCODONE, sodium chloride flush, sodium chloride flush, traMADol  General appearance: alert, cooperative and no distress Neurologic: intact Heart: regular rate and rhythm  Lungs: Slightly diminshed bibasilar breath sounds Abdomen: Soft, non tender, sporadic bowel sounds present Extremities: No LE edema. Motor/sensory intact thumb/hand Wound: Aquacel and left forearm dressing intact (just changed and is dry)  Lab Results: CBC: Recent Labs    05/15/19 1650 05/16/19 0210  WBC 15.0* 12.3*  HGB 11.1* 10.5*  HCT 33.4* 31.8*  PLT 174 143*   BMET:  Recent Labs    05/15/19 1650 05/16/19 0210  NA 138 135  K 4.2  4.1  CL 103 103  CO2 24 27  GLUCOSE 146* 142*  BUN 14 17  CREATININE 1.00 0.90  CALCIUM 8.6* 8.3*    CMET: Lab Results  Component Value Date   WBC 12.3 (H) 05/16/2019   HGB 10.5 (L) 05/16/2019   HCT 31.8 (L) 05/16/2019   PLT 143 (L) 05/16/2019   GLUCOSE 142 (H) 05/16/2019   CHOL 189 05/12/2019   TRIG 137 05/12/2019   HDL 51 05/12/2019   LDLCALC 111 (H) 05/12/2019   ALT 86 (H) 05/11/2019   AST 60 (H) 05/11/2019   NA 135 05/16/2019   K 4.1 05/16/2019   CL 103 05/16/2019   CREATININE 0.90 05/16/2019   BUN 17 05/16/2019   CO2 27 05/16/2019   TSH 6.549 (H) 05/14/2019   INR 1.4 (H) 05/14/2019   HGBA1C 5.6 05/14/2019    PT/INR:  Recent Labs     05/14/19 1326  LABPROT 17.0*  INR 1.4*   Radiology: No results found.   Assessment/Plan: S/P Procedure(s) (LRB): CORONARY ARTERY BYPASS GRAFTING (CABG) x TWO USING LEFT INTERNAL MAMMARY ARTERY AND LEFT RADIAL ARTERY (N/A) RADIAL ARTERY HARVEST (Left) TRANSESOPHAGEAL ECHOCARDIOGRAM (TEE) (N/A)   1. CV-He went into a fib with RVR last evening. Paced, SR this am. On Amiodarone drip, Lopressor 12.5 mg bid, Imdur 15 mg daily. Will transition off Amiodarone drip later today. Will hold Lopressor this am (BP). 2. Pulmonary-on 2 L via [place]. Continue to wean as able over next few days. Chest tubes with 320 cc last 24 hours. CXR this am appears stable (no pneumothorax, small pleural effusions, bibasilar atelectasis). Remove chest tube. Encourage incentive spirometer. 3. Volume overload-will give Lasix 20 mg IV bid 4. Expected post op blood loss anemia-H and H this am slightly decreased to 10.5 and 31.8 5. CBGs 168/133/133. Pre op HGA1C 5.6. Will stop accu checks and SS upon transfer 6. Remove foley 7. Hope to transfer soon  Nani Skillern PA-C 05/16/2019    Agree with above assessment and plan  Back in nsr- transition to po amiodarone DC chest tubes Transfer to 4 E Avoid plavix, lovenox,NOAC - Jehovah witness  P Prescott Gum MD

## 2019-05-16 NOTE — Progress Notes (Signed)
CARDIAC REHAB PHASE I   PRE:  Rate/Rhythm: 81 SR w PACs, POD    BP: sitting 101/69    SaO2: 97 2L, 88 RA in bed  MODE:  Ambulation: 370 ft   POST:  Rate/Rhythm: 92 SR with PACs, pacing    BP: sitting 121/91     SaO2: 98 RA  Pt getting ready to transfer. Rolled out of bed. Steady walking with RW. Able to walk on O2 but appears he needs O2 for lying. Some SOB, rest x1. Encouraged IS and another walk today. [id number]   Harriet Masson CES, ACSM 05/16/2019 1:40 PM

## 2019-05-16 NOTE — Plan of Care (Signed)
?  Problem: Education: ?Goal: Knowledge of General Education information will improve ?Description: Including pain rating scale, medication(s)/side effects and non-pharmacologic comfort measures ?Outcome: Progressing ?  ?Problem: Clinical Measurements: ?Goal: Respiratory complications will improve ?Outcome: Progressing ?  ?Problem: Activity: ?Goal: Risk for activity intolerance will decrease ?Outcome: Progressing ?  ?Problem: Nutrition: ?Goal: Adequate nutrition will be maintained ?Outcome: Progressing ?  ?Problem: Coping: ?Goal: Level of anxiety will decrease ?Outcome: Progressing ?  ?Problem: Pain Managment: ?Goal: General experience of comfort will improve ?Outcome: Progressing ?  ?

## 2019-05-17 ENCOUNTER — Inpatient Hospital Stay (HOSPITAL_COMMUNITY): Payer: 59

## 2019-05-17 DIAGNOSIS — I480 Paroxysmal atrial fibrillation: Secondary | ICD-10-CM

## 2019-05-17 LAB — BASIC METABOLIC PANEL
Anion gap: 9 (ref 5–15)
BUN: 19 mg/dL (ref 8–23)
CO2: 26 mmol/L (ref 22–32)
Calcium: 8.1 mg/dL — ABNORMAL LOW (ref 8.9–10.3)
Chloride: 101 mmol/L (ref 98–111)
Creatinine, Ser: 0.75 mg/dL (ref 0.61–1.24)
GFR calc Af Amer: >60 mL/min (ref >60)
GFR calc non Af Amer: >60 mL/min (ref >60)
Glucose, Bld: 110 mg/dL — ABNORMAL HIGH (ref 70–99)
Potassium: 4.1 mmol/L (ref 3.5–5.1)
Sodium: 136 mmol/L (ref 135–145)

## 2019-05-17 LAB — CBC
HCT: 28.4 % — ABNORMAL LOW (ref 39.0–52.0)
Hemoglobin: 9.7 g/dL — ABNORMAL LOW (ref 13.0–17.0)
MCH: 32.7 pg (ref 26.0–34.0)
MCHC: 34.2 g/dL (ref 30.0–36.0)
MCV: 95.6 fL (ref 80.0–100.0)
Platelets: 150 10*3/uL (ref 150–400)
RBC: 2.97 MIL/uL — ABNORMAL LOW (ref 4.22–5.81)
RDW: 12.2 % (ref 11.5–15.5)
WBC: 9.7 10*3/uL (ref 4.0–10.5)
nRBC: 0.3 % — ABNORMAL HIGH (ref 0.0–0.2)

## 2019-05-17 MED ORDER — AMIODARONE HCL 200 MG PO TABS
400.0000 mg | ORAL_TABLET | Freq: Two times a day (BID) | ORAL | Status: DC
  Administered 2019-05-17 – 2019-05-19 (×4): 400 mg via ORAL
  Filled 2019-05-17 (×4): qty 2

## 2019-05-17 MED ORDER — POTASSIUM CHLORIDE CRYS ER 20 MEQ PO TBCR
20.0000 meq | EXTENDED_RELEASE_TABLET | Freq: Every day | ORAL | Status: DC
  Administered 2019-05-17 – 2019-05-19 (×3): 20 meq via ORAL
  Filled 2019-05-17 (×3): qty 1

## 2019-05-17 MED ORDER — AMIODARONE HCL 200 MG PO TABS
200.0000 mg | ORAL_TABLET | Freq: Two times a day (BID) | ORAL | Status: DC
  Administered 2019-05-17: 200 mg via ORAL
  Filled 2019-05-17: qty 1

## 2019-05-17 MED ORDER — FUROSEMIDE 40 MG PO TABS
40.0000 mg | ORAL_TABLET | Freq: Every day | ORAL | Status: DC
  Administered 2019-05-17 – 2019-05-19 (×3): 40 mg via ORAL
  Filled 2019-05-17 (×3): qty 1

## 2019-05-17 MED ORDER — HYDROXYZINE HCL 25 MG PO TABS: 25.0000 mg | ORAL_TABLET | Freq: Two times a day (BID) | ORAL | Status: DC | PRN

## 2019-05-17 NOTE — Progress Notes (Addendum)
      [address]       [place] [address]             [phone]        3 Days Post-Op Procedure(s) (LRB): CORONARY ARTERY BYPASS GRAFTING (CABG) x TWO USING LEFT INTERNAL MAMMARY ARTERY AND LEFT RADIAL ARTERY (N/A) RADIAL ARTERY HARVEST (Left) TRANSESOPHAGEAL ECHOCARDIOGRAM (TEE) (N/A)  Subjective: Patient already walked this am (470 feet) and had a bowel movement  Objective: Vital signs in last 24 hours: Temp:  [97.6 F (36.4 C)-99.3 F (37.4 C)] 99.3 F (37.4 C) (05/07 0452) Pulse Rate:  [71-89] 73 (05/07 0452) Cardiac Rhythm: Normal sinus rhythm;Atrial fibrillation (05/07 0452) Resp:  [18-28] 20 (05/07 0452) BP: (96-119)/(67-85) 117/81 (05/07 0452) SpO2:  [91 %-98 %] 96 % (05/07 0452) Weight:  [77.5 kg] 77.5 kg (05/07 0452)  Pre op weight 73.6 kg Current Weight  05/17/19 77.5 kg      Intake/Output from previous day: 05/06 0701 - 05/07 0700 In: 455.8 [P.O.:120; I.V.:335.8] Out: 485 [Urine:425; Chest Tube:60]   Physical Exam:  Cardiovascular: RRR Pulmonary: Slightly diminished bibasilar breath sounds Abdomen: Soft, non tender, bowel sounds present. Extremities: No lower extremity edema. No numbness in left thumb or hand Wounds: Aquacel removed and sternal wound is clean and dry.  No erythema or signs of infection. Left forearm sound is clean and dry.  Lab Results: CBC: Recent Labs    05/16/19 0210 05/17/19 0415  WBC 12.3* 9.7  HGB 10.5* 9.7*  HCT 31.8* 28.4*  PLT 143* 150   BMET:  Recent Labs    05/16/19 0210 05/17/19 0415  NA 135 136  K 4.1 4.1  CL 103 101  CO2 27 26  GLUCOSE 142* 110*  BUN 17 19  CREATININE 0.90 0.75  CALCIUM 8.3* 8.1*    PT/INR:  Lab Results  Component Value Date   INR 1.4 (H) 05/14/2019   INR 1.1 05/14/2019   ABG:  INR: Will add last result for INR, ABG once components are confirmed Will add last 4 CBG results once components are confirmed  Assessment/Plan:  1. CV-He went into a fib  with RVR 05/05.SR this am with HR in the low 70's. On Amiodarone 400 mg bid, Lopressor 12.5 mg bid, Imdur 15 mg daily. Will decrease Amiodarone to 200 mg bid. 2. Pulmonary-on room air. CXR this am appears stable (no pneumothorax, small pleural effusions, ). Encourage incentive spirometer. 3. Volume overload-will give Lasix 40 mg today 4. Expected post op blood loss anemia-H and H this am slightly decreased to 9.7 and 28.4. Continue Trinsicon 5. Remove central line 6. Likely discharge in 1-2 days   Donielle M ZimmermanPA-C 05/17/2019,7:01 AM   Back in NSR, CXR clear Remove cordis Hct 28- minimize blood draws - J Witness

## 2019-05-17 NOTE — Progress Notes (Signed)
Pt ambulated x 470 feet around the unit without assist, pt tolerated well on room air 90-100's sinus

## 2019-05-17 NOTE — Progress Notes (Signed)
CARDIAC REHAB PHASE I   PRE:  Rate/Rhythm: 67 SR  BP:  Sitting: 113/77      SaO2: 92 RA  MODE:  Ambulation: 470 ft   POST:  Rate/Rhythm: 103 Afib  BP:  Sitting: 116/83    SaO2: 98 RA  Pt ambulated 455ft in hallway independently with steady gait. Pt c/o increased SOB during ambulation, pt noted to be in Afib. Pt coached through purse lipped breathing. Pt returned to bed. Began d/c education. Pt denies DME needs. Encouraged continued IS use and walks. Will continue to follow.  [phone] Reynold Bowen, RN BSN 05/17/2019 11:30 AM

## 2019-05-17 NOTE — Progress Notes (Signed)
Progress Note  Patient Name: Juan Payne Date of Encounter: 05/17/2019  Primary Cardiologist: Thomasene Ripple, DO   Subjective   Patient remains in sinus this morning. Denies chest pain, sob, palpitations. POD 3 from CABG.  Inpatient Medications    Scheduled Meds: . acetaminophen  1,000 mg Oral Q6H   Or  . acetaminophen (TYLENOL) oral liquid 160 mg/5 mL  1,000 mg Per Tube Q6H  . amiodarone  200 mg Oral BID  . aspirin EC  325 mg Oral Daily   Or  . aspirin  324 mg Per Tube Daily  . atorvastatin  40 mg Oral q1800  . bisacodyl  10 mg Oral Daily   Or  . bisacodyl  10 mg Rectal Daily  . Chlorhexidine Gluconate Cloth  6 each Topical Daily  . Chlorhexidine Gluconate Cloth  6 each Topical Daily  . citalopram  20 mg Oral QHS  . docusate sodium  200 mg Oral Daily  . ferrous fumarate-b12-vitamic C-folic acid  1 capsule Oral BID PC  . furosemide  40 mg Oral Daily  . isosorbide mononitrate  15 mg Oral Daily  . metoCLOPramide (REGLAN) injection  10 mg Intravenous Q6H  . metoprolol tartrate  12.5 mg Oral BID   Or  . metoprolol tartrate  12.5 mg Per Tube BID  . pantoprazole  40 mg Oral Daily  . potassium chloride  20 mEq Oral Daily   Continuous Infusions: . sodium chloride 10 mL/hr at 05/14/19 1347  . lactated ringers     PRN Meds: fentaNYL (SUBLIMAZE) injection, hydrOXYzine, metoprolol tartrate, ondansetron (ZOFRAN) IV, oxyCODONE, sodium chloride flush, sodium chloride flush, traMADol   Vital Signs    Vitals:   05/16/19 2033 05/16/19 2338 05/17/19 0452 05/17/19 0831  BP: 106/70 106/71 117/81 124/83  Pulse: 72 71 73 68  Resp: 20 (!) 21 20 20   Temp: 98.8 F (37.1 C) 98 F (36.7 C) 99.3 F (37.4 C) 98.9 F (37.2 C)  TempSrc: Oral Oral Oral Oral  SpO2: 94% 94% 96% 95%  Weight:   77.5 kg   Height:        Intake/Output Summary (Last 24 hours) at 05/17/2019 1041 Last data filed at 05/16/2019 2100 Gross per 24 hour  Intake 169.99 ml  Output --  Net 169.99 ml   Last 3  Weights 05/17/2019 05/16/2019 05/15/2019  Weight (lbs) 170 lb 12.8 oz 172 lb 9.9 oz 174 lb 6.1 oz  Weight (kg) 77.474 kg 78.3 kg 79.1 kg      Telemetry    NSR, HR in the 60s, had brief afib episode yesterday - Personally Reviewed  ECG    NSR, 67 bpm, PVC, QTd 497 ms, nonspecific T wave changes- Personally Reviewed  Physical Exam   GEN: No acute distress.   Neck: No JVD Cardiac: RRR, no murmurs, rubs, or gallops.  Respiratory: Clear to auscultation bilaterally. GI: Soft, nontender, non-distended  MS: No edema; No deformity. Neuro:  Nonfocal  Psych: Normal affect   Labs    High Sensitivity Troponin:  No results for input(s): TROPONINIHS in the last 720 hours.    Chemistry Recent Labs  Lab 05/11/19 0454 05/12/19 0216 05/15/19 1650 05/16/19 0210 05/17/19 0415  NA 138   < > 138 135 136  K 3.8   < > 4.2 4.1 4.1  CL 101   < > 103 103 101  CO2 24   < > 24 27 26   GLUCOSE 103*   < > 146* 142* 110*  BUN 16   < > 14 17 19   CREATININE 0.83   < > 1.00 0.90 0.75  CALCIUM 9.6   < > 8.6* 8.3* 8.1*  PROT 7.0  --   --   --   --   ALBUMIN 4.1  --   --   --   --   AST 60*  --   --   --   --   ALT 86*  --   --   --   --   ALKPHOS 40  --   --   --   --   BILITOT 0.7  --   --   --   --   GFRNONAA >60   < > >60 >60 >60  GFRAA >60   < > >60 >60 >60  ANIONGAP 13   < > 11 5 9    < > = values in this interval not displayed.     Hematology Recent Labs  Lab 05/15/19 1650 05/16/19 0210 05/17/19 0415  WBC 15.0* 12.3* 9.7  RBC 3.43* 3.28* 2.97*  HGB 11.1* 10.5* 9.7*  HCT 33.4* 31.8* 28.4*  MCV 97.4 97.0 95.6  MCH 32.4 32.0 32.7  MCHC 33.2 33.0 34.2  RDW 12.1 12.0 12.2  PLT 174 143* 150    BNPNo results for input(s): BNP, PROBNP in the last 168 hours.   DDimer No results for input(s): DDIMER in the last 168 hours.   Radiology    DG CHEST PORT 1 VIEW  Result Date: 05/17/2019 CLINICAL DATA:  Pneumothorax EXAM: PORTABLE CHEST 1 VIEW COMPARISON:  05/16/2019 FINDINGS: Left basilar  atelectasis. Right lung is clear. No pneumothorax is seen. Heart is normal in size. Postsurgical changes related to prior CABG. Right IJ venous sheath. Median sternotomy. IMPRESSION: Postsurgical changes related to prior CABG. Left basilar atelectasis.  No pneumothorax is seen. Electronically Signed   By: Julian Hy M.D.   On: 05/17/2019 08:28   DG Chest Port 1 View  Result Date: 05/16/2019 CLINICAL DATA:  Post CABG, history hypertension EXAM: PORTABLE CHEST 1 VIEW COMPARISON:  Portable exam 0558 hours compared to 05/15/2019 FINDINGS: Interval removal of Swan-Ganz catheter. RIGHT jugular line, mediastinal drain and LEFT thoracostomy tube remain. Normal heart size post CABG. Mediastinal contours and pulmonary vascularity normal. Decreased lung volumes with bibasilar atelectasis. No acute infiltrate, pleural effusion or pneumothorax. IMPRESSION: Decreased lung volumes with bibasilar atelectasis. Electronically Signed   By: Lavonia Judge M.D.   On: 05/16/2019 08:10    Cardiac Studies   Echo 05/13/19:   1. Left ventricular ejection fraction, by estimation, is 45 to 50%. The  left ventricle has mildly decreased function. The left ventricle  demonstrates regional wall motion abnormalities (see scoring  diagram/findings for description). Left ventricular  diastolic parameters are consistent with Grade I diastolic dysfunction  (impaired relaxation).  2. Right ventricular systolic function is normal. The right ventricular  size is normal.  3. The mitral valve is normal in structure. Mild mitral valve  regurgitation. No evidence of mitral stenosis.  4. The aortic valve is tricuspid. Aortic valve regurgitation is not  visualized. No aortic stenosis is present.  5. Aortic dilatation noted. There is mild dilatation of the ascending  aorta measuring 38 mm.  6. The inferior vena cava is normal in size with greater than 50%  respiratory variability, suggesting right atrial pressure of 3 mmHg.    LHC 05/13/19:  2nd Mrg lesion is 90% stenosed.  3rd Mrg-1 lesion is 99% stenosed.  3rd Mrg-2 lesion is 95% stenosed.  Prox LAD to Mid LAD lesion is 90% stenosed.  Ost LAD to Prox LAD lesion is 60% stenosed.  The left ventricular systolic function is normal.  LV end diastolic pressure is normal.  The left ventricular ejection fraction is 55-65% by visual estimate.  Patient Profile     64 y.o. 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  Assessment & Plan    Unstable angina/CAD s/p CABGx2 - POD3 and overall recovering well - He had post-op afib 5/5 but converted to sinus on amiodarone. Remains in sinus - aspirin, statin, Imdur - Metoprolol started  HTN - Home HCTZ and clonidine held - Metoprolol started - BP this AM 124/83  Postop afib  - amiodarone initiated - Jehova's witness - Plan to not start a/c if patient remains in sinus  For questions or updates, please contact [facility] Please consult [email or web address] for contact info under        Signed, Aaliyana Fredericks David Stall, PA-C  05/17/2019, 10:41 AM

## 2019-05-18 ENCOUNTER — Inpatient Hospital Stay (HOSPITAL_COMMUNITY): Payer: 59

## 2019-05-18 DIAGNOSIS — Z951 Presence of aortocoronary bypass graft: Secondary | ICD-10-CM

## 2019-05-18 NOTE — Progress Notes (Signed)
Patient seen ambulating independently in the hallway x 2 today.  Education completed re: sternal precautions, activity restrictions, exercise progression, signs of infection, when to call the doctor, referred to phase II cardiac rehab in [place], [place]. [phone]

## 2019-05-18 NOTE — Progress Notes (Addendum)
[address]       [place] [address]             [phone]      4 Days Post-Op Procedure(s) (LRB): CORONARY ARTERY BYPASS GRAFTING (CABG) x TWO USING LEFT INTERNAL MAMMARY ARTERY AND LEFT RADIAL ARTERY (N/A) RADIAL ARTERY HARVEST (Left) TRANSESOPHAGEAL ECHOCARDIOGRAM (TEE) (N/A) Subjective: Has had a few episodes of afib, asymptomatic  Objective: Vital signs in last 24 hours: Temp:  [98 F (36.7 C)-99.3 F (37.4 C)] 98.1 F (36.7 C) (05/08 0458) Pulse Rate:  [66-112] 66 (05/08 0458) Cardiac Rhythm: Atrial fibrillation (05/08 0230) Resp:  [17-20] 18 (05/08 0458) BP: (110-127)/(77-96) 111/77 (05/08 0458) SpO2:  [93 %-100 %] 94 % (05/08 0500) Weight:  [77.2 kg] 77.2 kg (05/08 0500)  Hemodynamic parameters for last 24 hours:    Intake/Output from previous day: 05/07 0701 - 05/08 0700 In: 975.5 [P.O.:120; I.V.:855.5] Out: -  Intake/Output this shift: No intake/output data recorded.  General appearance: alert, cooperative and no distress Heart: regular rate and rhythm Lungs: min dim in bases Abdomen: benign Extremities: no edema, left hand N/V intact Wound: incis healing well  Lab Results: Recent Labs    05/16/19 0210 05/17/19 0415  WBC 12.3* 9.7  HGB 10.5* 9.7*  HCT 31.8* 28.4*  PLT 143* 150   BMET:  Recent Labs    05/16/19 0210 05/17/19 0415  NA 135 136  K 4.1 4.1  CL 103 101  CO2 27 26  GLUCOSE 142* 110*  BUN 17 19  CREATININE 0.90 0.75  CALCIUM 8.3* 8.1*    PT/INR: No results for input(s): LABPROT, INR in the last 72 hours. ABG    Component Value Date/Time   PHART 7.311 (L) 05/14/2019 1957   HCO3 25.2 05/14/2019 1957   TCO2 27 05/14/2019 1957   ACIDBASEDEF 1.0 05/14/2019 1957   O2SAT 95.0 05/14/2019 1957   CBG (last 3)  Recent Labs    05/15/19 2023 05/16/19 0013 05/16/19 0416  GLUCAP 168* 133* 133*    Meds Scheduled Meds: . acetaminophen  1,000 mg Oral Q6H   Or  . acetaminophen (TYLENOL) oral liquid  160 mg/5 mL  1,000 mg Per Tube Q6H  . amiodarone  400 mg Oral BID  . aspirin EC  325 mg Oral Daily   Or  . aspirin  324 mg Per Tube Daily  . atorvastatin  40 mg Oral q1800  . bisacodyl  10 mg Oral Daily   Or  . bisacodyl  10 mg Rectal Daily  . Chlorhexidine Gluconate Cloth  6 each Topical Daily  . Chlorhexidine Gluconate Cloth  6 each Topical Daily  . citalopram  20 mg Oral QHS  . docusate sodium  200 mg Oral Daily  . ferrous [id number] C-folic acid  1 capsule Oral BID PC  . furosemide  40 mg Oral Daily  . isosorbide mononitrate  15 mg Oral Daily  . metoCLOPramide (REGLAN) injection  10 mg Intravenous Q6H  . metoprolol tartrate  12.5 mg Oral BID   Or  . metoprolol tartrate  12.5 mg Per Tube BID  . pantoprazole  40 mg Oral Daily  . potassium chloride  20 mEq Oral Daily   Continuous Infusions: . sodium chloride 10 mL/hr at 05/14/19 1347  . lactated ringers     PRN Meds:.fentaNYL (SUBLIMAZE) injection, hydrOXYzine, metoprolol tartrate, ondansetron (ZOFRAN) IV, oxyCODONE, sodium chloride flush, sodium chloride flush, traMADol  Xrays DG CHEST PORT 1 VIEW  Result  Date: 05/17/2019 CLINICAL DATA:  Pneumothorax EXAM: PORTABLE CHEST 1 VIEW COMPARISON:  05/16/2019 FINDINGS: Left basilar atelectasis. Right lung is clear. No pneumothorax is seen. Heart is normal in size. Postsurgical changes related to prior CABG. Right IJ venous sheath. Median sternotomy. IMPRESSION: Postsurgical changes related to prior CABG. Left basilar atelectasis.  No pneumothorax is seen. Electronically Signed   By: Charline Bills M.D.   On: 05/17/2019 08:28    Assessment/Plan: S/P Procedure(s) (LRB): CORONARY ARTERY BYPASS GRAFTING (CABG) x TWO USING LEFT INTERNAL MAMMARY ARTERY AND LEFT RADIAL ARTERY (N/A) RADIAL ARTERY HARVEST (Left) TRANSESOPHAGEAL ECHOCARDIOGRAM (TEE) (N/A)    1 doing well overall, having some intermit atrial fib, currently SR- hoping to avoid ACRX as is Jehova's witness.  CHA2DS2-VASc score- 4 probably indicates starting if continues. Observe for now 2 CXR minor ATX 3 sats good on RA 4 VSS, afebrile 5 cont to push rehab and pulm toilet  LOS: 7 days    Rowe Clack PA-C Pager [phone] 05/18/2019  Currently in sinus Poss home tomorrow if rhythm stable

## 2019-05-19 MED ORDER — AMIODARONE HCL 400 MG PO TABS: 400.0000 mg | ORAL_TABLET | Freq: Two times a day (BID) | ORAL | 0 refills | Status: DC

## 2019-05-19 MED ORDER — ASPIRIN 325 MG PO TBEC: 325.0000 mg | DELAYED_RELEASE_TABLET | Freq: Every day | ORAL | Status: DC

## 2019-05-19 MED ORDER — FE FUMARATE-B12-VIT C-FA-IFC PO CAPS: 1.0000 | ORAL_CAPSULE | Freq: Two times a day (BID) | ORAL | 1 refills | Status: DC

## 2019-05-19 MED ORDER — TRAMADOL HCL 50 MG PO TABS: 50.0000 mg | ORAL_TABLET | Freq: Four times a day (QID) | ORAL | 0 refills | Status: DC | PRN

## 2019-05-19 MED ORDER — ISOSORBIDE MONONITRATE ER 30 MG PO TB24: 15.0000 mg | ORAL_TABLET | Freq: Every day | ORAL | 1 refills | Status: DC

## 2019-05-19 MED ORDER — ATORVASTATIN CALCIUM 40 MG PO TABS: 40.0000 mg | ORAL_TABLET | Freq: Every day | ORAL | 1 refills | Status: DC

## 2019-05-19 NOTE — Progress Notes (Addendum)
Pacing wires and chest tube sutures removed. Pacing wires ends intact. Pt's skin had minimal serosangeous. Applied steri strip on pt's skin and dry gauze applied.  Pt tolerated well.   Lawson Radar, RN

## 2019-05-19 NOTE — Progress Notes (Addendum)
[address]       [place] [address]             [phone]      5 Days Post-Op Procedure(s) (LRB): CORONARY ARTERY BYPASS GRAFTING (CABG) x TWO USING LEFT INTERNAL MAMMARY ARTERY AND LEFT RADIAL ARTERY (N/A) RADIAL ARTERY HARVEST (Left) TRANSESOPHAGEAL ECHOCARDIOGRAM (TEE) (N/A) Subjective: Feels well, maintaining sinus  Objective: Vital signs in last 24 hours: Temp:  [98 F (36.7 C)-99.3 F (37.4 C)] 98 F (36.7 C) (05/09 0407) Pulse Rate:  [62-75] 68 (05/09 0407) Cardiac Rhythm: Normal sinus rhythm (05/08 1900) Resp:  [15-21] 21 (05/09 0415) BP: (112-143)/(84-92) 112/92 (05/09 0407) SpO2:  [91 %-99 %] 99 % (05/09 0407) Weight:  [76.9 kg] 76.9 kg (05/09 0415)  Hemodynamic parameters for last 24 hours:    Intake/Output from previous day: No intake/output data recorded. Intake/Output this shift: No intake/output data recorded.  General appearance: alert, cooperative and no distress Heart: regular rate and rhythm Lungs: min dim in bases Abdomen: benign Extremities: no edema Wound: incis healing well  Lab Results: Recent Labs    05/17/19 0415  WBC 9.7  HGB 9.7*  HCT 28.4*  PLT 150   BMET:  Recent Labs    05/17/19 0415  NA 136  K 4.1  CL 101  CO2 26  GLUCOSE 110*  BUN 19  CREATININE 0.75  CALCIUM 8.1*    PT/INR: No results for input(s): LABPROT, INR in the last 72 hours. ABG    Component Value Date/Time   PHART 7.311 (L) 05/14/2019 1957   HCO3 25.2 05/14/2019 1957   TCO2 27 05/14/2019 1957   ACIDBASEDEF 1.0 05/14/2019 1957   O2SAT 95.0 05/14/2019 1957   CBG (last 3)  No results for input(s): GLUCAP in the last 72 hours.  Meds Scheduled Meds:  acetaminophen  1,000 mg Oral Q6H   Or   acetaminophen (TYLENOL) oral liquid 160 mg/5 mL  1,000 mg Per Tube Q6H   amiodarone  400 mg Oral BID   aspirin EC  325 mg Oral Daily   Or   aspirin  324 mg Per Tube Daily   atorvastatin  40 mg Oral q1800   bisacodyl  10 mg  Oral Daily   Or   bisacodyl  10 mg Rectal Daily   Chlorhexidine Gluconate Cloth  6 each Topical Daily   Chlorhexidine Gluconate Cloth  6 each Topical Daily   citalopram  20 mg Oral QHS   docusate sodium  200 mg Oral Daily   ferrous [id number] C-folic acid  1 capsule Oral BID PC   furosemide  40 mg Oral Daily   isosorbide mononitrate  15 mg Oral Daily   metoprolol tartrate  12.5 mg Oral BID   Or   metoprolol tartrate  12.5 mg Per Tube BID   pantoprazole  40 mg Oral Daily   potassium chloride  20 mEq Oral Daily   Continuous Infusions:  sodium chloride 10 mL/hr at 05/14/19 1347   lactated ringers     PRN Meds:.fentaNYL (SUBLIMAZE) injection, hydrOXYzine, metoprolol tartrate, ondansetron (ZOFRAN) IV, oxyCODONE, sodium chloride flush, sodium chloride flush, traMADol  Xrays DG Chest 2 View  Result Date: 05/18/2019 CLINICAL DATA:  Atelectasis.  Recent coronary artery bypass grafting EXAM: CHEST - 2 VIEW COMPARISON:  May 17, 2019 FINDINGS: Cordis has been removed. Temporary pacemaker wires are attached to the right heart. No pneumothorax. There is persistent atelectasis in the left base. There is no edema  or airspace opacity. Heart is upper normal in size with pulmonary vascularity within normal limits. Patient is status post median sternotomy. No bone lesions. IMPRESSION: Cordis has been removed. No pneumothorax. Left base atelectasis persists. No new opacity. Stable cardiac silhouette. Electronically Signed   By: Bretta Bang III M.D.   On: 05/18/2019 09:26    Assessment/Plan: S/P Procedure(s) (LRB): CORONARY ARTERY BYPASS GRAFTING (CABG) x TWO USING LEFT INTERNAL MAMMARY ARTERY AND LEFT RADIAL ARTERY (N/A) RADIAL ARTERY HARVEST (Left) TRANSESOPHAGEAL ECHOCARDIOGRAM (TEE) (N/A)  1 doing well 2 hemodyn stable in sinus, some HTN, will resume ARB and change back to home coreg dosing 3 sats good on RA 4 incis healing well 5 no new labs or CXR 6 appears stable  for d/c  LOS: 8 days    Rowe Clack PA-C Pager [phone] 05/19/2019 Patient stable , plan home today I have seen and examined Juan Payne and agree with the above assessment  and plan.  Delight Ovens MD Beeper [phone] Office [phone] 05/19/2019 1:55 PM

## 2019-05-19 NOTE — Progress Notes (Signed)
D/c tele and IVs. Went over AVS with pt and pt's daughter and all questions were answered.   Lawson Radar, RN

## 2019-05-20 MED FILL — Mannitol IV Soln 20%: INTRAVENOUS | Qty: 500 | Status: AC

## 2019-05-20 MED FILL — Sodium Chloride IV Soln 0.9%: INTRAVENOUS | Qty: 2000 | Status: AC

## 2019-05-20 MED FILL — Lidocaine HCl Local Soln Prefilled Syringe 100 MG/5ML (2%): INTRAMUSCULAR | Qty: 5 | Status: AC

## 2019-05-20 MED FILL — Heparin Sodium (Porcine) Inj 1000 Unit/ML: INTRAMUSCULAR | Qty: 30 | Status: AC

## 2019-05-20 MED FILL — Electrolyte-R (PH 7.4) Solution: INTRAVENOUS | Qty: 3000 | Status: AC

## 2019-05-20 MED FILL — Magnesium Sulfate Inj 50%: INTRAMUSCULAR | Qty: 10 | Status: AC

## 2019-05-20 MED FILL — Heparin Sodium (Porcine) Inj 1000 Unit/ML: INTRAMUSCULAR | Qty: 10 | Status: AC

## 2019-05-20 MED FILL — Sodium Bicarbonate IV Soln 8.4%: INTRAVENOUS | Qty: 50 | Status: AC

## 2019-05-20 MED FILL — Potassium Chloride Inj 2 mEq/ML: INTRAVENOUS | Qty: 40 | Status: AC

## 2019-05-21 ENCOUNTER — Telehealth (HOSPITAL_COMMUNITY): Payer: Self-pay

## 2019-05-21 NOTE — Telephone Encounter (Signed)
Faxed referral to [facility] for Cardiac Rehab. 

## 2019-06-04 ENCOUNTER — Other Ambulatory Visit: Payer: Self-pay

## 2019-06-04 ENCOUNTER — Ambulatory Visit (INDEPENDENT_AMBULATORY_CARE_PROVIDER_SITE_OTHER): Payer: 59 | Admitting: Cardiology

## 2019-06-04 ENCOUNTER — Encounter: Payer: Self-pay | Admitting: Cardiology

## 2019-06-04 VITALS — BP 104/66 | HR 67 | Ht 69.0 in | Wt 165.8 lb

## 2019-06-04 DIAGNOSIS — I480 Paroxysmal atrial fibrillation: Secondary | ICD-10-CM | POA: Diagnosis not present

## 2019-06-04 DIAGNOSIS — I10 Essential (primary) hypertension: Secondary | ICD-10-CM

## 2019-06-04 DIAGNOSIS — I2510 Atherosclerotic heart disease of native coronary artery without angina pectoris: Secondary | ICD-10-CM | POA: Diagnosis not present

## 2019-06-04 DIAGNOSIS — Z951 Presence of aortocoronary bypass graft: Secondary | ICD-10-CM | POA: Diagnosis not present

## 2019-06-04 DIAGNOSIS — E782 Mixed hyperlipidemia: Secondary | ICD-10-CM | POA: Insufficient documentation

## 2019-06-04 DIAGNOSIS — R0989 Other specified symptoms and signs involving the circulatory and respiratory systems: Secondary | ICD-10-CM

## 2019-06-04 DIAGNOSIS — R931 Abnormal findings on diagnostic imaging of heart and coronary circulation: Secondary | ICD-10-CM | POA: Insufficient documentation

## 2019-06-04 NOTE — Progress Notes (Signed)
Cardiology Office Note:    Date:  06/04/2019   ID:  Juan Payne, DOB 1955-11-15, MRN [id number]  PCP:  Casper Harrison Stephanie Coup, MD  Cardiologist:  Thomasene Ripple, DO  Electrophysiologist:  None   Referring MD: Street, Stephanie Coup, *   " I am doing fine"   History of Present Illness:    Juan Payne is a 64 y.o. male with a hx of coronary artery disease status post CABG with LIMA to LAD, left radial artery to OM2 on May 14, 2019, hyperlipidemia, hypertension, postop A. fib currently on amiodarone presents today for his post operative visit.  I did see the patient on May 07, 2019 at that time he was experiencing significant symptoms therefore I recommended he go to [facility] ED.  He was admitted to the hospital and underwent a nuclear stress test which was reported to be positive.  The patient was then transferred to [facility] where he underwent left heart catheterization showing multivessel disease.  Was recommended that he undergo coronary artery bypass grafting.  He successfully completed this surgery on May 14, 2019.  Postop stay was complicated by paroxysmal atrial fibrillation he was placed on amiodarone..  Since his discharge he has been participating in cardiac rehab and tells me that he is doing well with this program.  Today he offers no complaints.  Past Medical History:  Diagnosis Date  . Chest pain 05/07/2019  . Coronary artery disease 05/14/2019  . Coronary artery disease involving native heart without angina pectoris   . Essential hypertension 05/07/2019  . Hyperlipidemia   . Near syncope 05/11/2019  . PAF (paroxysmal atrial fibrillation) (HCC)   . Refusal of blood transfusions as patient is Jehovah's Witness 05/11/2019  . 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 Unstable angina (HCC) 05/11/2019    Past Surgical History:  Procedure Laterality Date  . CORONARY ARTERY BYPASS GRAFT N/A 05/14/2019   Procedure: CORONARY ARTERY  BYPASS GRAFTING (CABG) x TWO USING LEFT INTERNAL MAMMARY ARTERY AND LEFT RADIAL ARTERY;  Surgeon: Kerin Perna, MD;  Location: [facility] OR;  Service: Open Heart Surgery;  Laterality: N/A;  . LEFT HEART CATH AND CORONARY ANGIOGRAPHY N/A 05/13/2019   Procedure: LEFT HEART CATH AND CORONARY ANGIOGRAPHY;  Surgeon: Runell Gess, MD;  Location: [facility] INVASIVE CV LAB;  Service: Cardiovascular;  Laterality: N/A;  . RADIAL ARTERY HARVEST Left 05/14/2019   Procedure: RADIAL ARTERY HARVEST;  Surgeon: Kerin Perna, MD;  Location: [facility] OR;  Service: Open Heart Surgery;  Laterality: Left;  . TEE WITHOUT CARDIOVERSION N/A 05/14/2019   Procedure: TRANSESOPHAGEAL ECHOCARDIOGRAM (TEE);  Surgeon: Donata Clay, Theron Arista, MD;  Location[facility] OR;  Service: Open Heart Surgery;  Laterality: N/A;    Current Medications: Current Meds  Medication Sig  . amiodarone (PACERONE) 400 MG tablet Take 1 tablet (400 mg total) by mouth 2 (two) times daily. For 7 days, then convert to 400 mg once daily  . aspirin EC 325 MG EC tablet Take 1 tablet (325 mg total) by mouth daily.  Marland Kitchen atorvastatin (LIPITOR) 40 MG tablet Take 1 tablet (40 mg total) by mouth daily at 6 PM.  . carvedilol (COREG) 6.25 MG tablet Take 6.25 mg by mouth 2 (two) times daily.  . citalopram (CELEXA) 20 MG tablet Take 20 mg by mouth at bedtime.   . ferrous fumarate-b12-vitamic C-folic acid (TRINSICON / FOLTRIN) capsule Take 1 capsule by mouth 2 (two)  times daily after a meal.  . hydrOXYzine (ATARAX/VISTARIL) 25 MG tablet Take 25 mg by mouth 3 (three) times daily.  . isosorbide mononitrate (IMDUR) 30 MG 24 hr tablet Take 0.5 tablets (15 mg total) by mouth daily.  Marland Kitchen losartan-hydrochlorothiazide (HYZAAR) 100-25 MG tablet Take 1 tablet by mouth daily.  . traMADol (ULTRAM) 50 MG tablet Take 1 tablet (50 mg total) by mouth every 6 (six) hours as needed for moderate pain.     Allergies:   Morphine and related   Social History   Socioeconomic History  . Marital status: Married     Spouse name: Not on file  . Number of children: Not on file  . Years of education: Not on file  . Highest education level: Not on file  Occupational History  . Not on file  Tobacco Use  . Smoking status: Never Smoker  . Smokeless tobacco: Never Used  Substance and Sexual Activity  . Alcohol use: Not Currently  . Drug use: Never  . Sexual activity: Not on file  Other Topics Concern  . Not on file  Social History Narrative  . Not on file   Social Determinants of Health   Financial Resource Strain:   . Difficulty of Paying Living Expenses:   Food Insecurity:   . Worried About Programme researcher, broadcasting/film/video in the Last Year:   . Barista in the Last Year:   Transportation Needs:   . Freight forwarder (Medical):   Marland Kitchen Lack of Transportation (Non-Medical):   Physical Activity:   . Days of Exercise per Week:   . Minutes of Exercise per Session:   Stress:   . Feeling of Stress :   Social Connections:   . Frequency of Communication with Friends and Family:   . Frequency of Social Gatherings with Friends and Family:   . Attends Religious Services:   . Active Member of Clubs or Organizations:   . Attends Banker Meetings:   Marland Kitchen Marital Status:      Family History: The patient's family history includes Heart attack in his brother and father; Hypertension in his mother.  ROS:   Review of Systems  Constitution: Negative for decreased appetite, fever and weight gain.  HENT: Negative for congestion, ear discharge, hoarse voice and sore throat.   Eyes: Negative for discharge, redness, vision loss in right eye and visual halos.  Cardiovascular: Negative for chest pain, dyspnea on exertion, leg swelling, orthopnea and palpitations.  Respiratory: Negative for cough, hemoptysis, shortness of breath and snoring.   Endocrine: Negative for heat intolerance and polyphagia.  Hematologic/Lymphatic: Negative for bleeding problem. Does not bruise/bleed easily.  Skin: Negative  for flushing, nail changes, rash and suspicious lesions.  Musculoskeletal: Negative for arthritis, joint pain, muscle cramps, myalgias, neck pain and stiffness.  Gastrointestinal: Negative for abdominal pain, bowel incontinence, diarrhea and excessive appetite.  Genitourinary: Negative for decreased libido, genital sores and incomplete emptying.  Neurological: Negative for brief paralysis, focal weakness, headaches and loss of balance.  Psychiatric/Behavioral: Negative for altered mental status, depression and suicidal ideas.  Allergic/Immunologic: Negative for HIV exposure and persistent infections.    EKGs/Labs/Other Studies Reviewed:    The following studies were reviewed today:   EKG:  The ekg ordered today demonstrates sinus rhythm, heart rate 67 beats minute compared to prior EKG on May 17, 2019 [facility] does not exist.   TTE iMPRESSIONS May 13, 2019 1. Left ventricular ejection fraction, by estimation, is 45 to 50%. The  left ventricle has mildly decreased function. The left ventricle demonstrates regional wall motion abnormalities (see scoring diagram/findings for description). Left ventricular diastolic parameters are consistent with Grade I diastolic dysfunction (impaired relaxation).  2. Right ventricular systolic function is normal. The right ventricular size is normal.  3. The mitral valve is normal in structure. Mild mitral valve regurgitation. No evidence of mitral stenosis.  4. The aortic valve is tricuspid. Aortic valve regurgitation is not visualized. No aortic stenosis is present.  5. Aortic dilatation noted. 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  6. The inferior vena cava is normal in size with greater than 50% respiratory variability, suggesting right atrial pressure of 3 mmHg.    Recent Labs: 05/11/2019: ALT 86 05/14/2019: TSH 6.549 05/15/2019: Magnesium 2.4 05/17/2019: BUN 19; Creatinine, Ser 0.75; Hemoglobin 9.7; Platelets 150; Potassium 4.1;  Sodium 136  Recent Lipid Panel    Component Value Date/Time   CHOL 189 05/12/2019 0216   TRIG 137 05/12/2019 0216   HDL 51 05/12/2019 0216   CHOLHDL 3.7 05/12/2019 0216   VLDL 27 05/12/2019 0216   LDLCALC 111 (H) 05/12/2019 0216    Physical Exam:    VS:  BP 104/66   Pulse 67   Ht 5\' 9"  (1.753 m)   Wt 165 lb 12.8 oz (75.2 kg)   SpO2 98%   BMI 24.48 kg/m     Wt Readings from Last 3 Encounters:  06/04/19 165 lb 12.8 oz (75.2 kg)  05/19/19 169 lb 8.5 oz (76.9 kg)  05/07/19 166 lb (75.3 kg)     GEN: Well nourished, well developed in no acute distress HEENT: Normal NECK: No JVD; No carotid bruits LYMPHATICS: No lymphadenopathy CARDIAC: S1S2 noted,RRR, no murmurs, rubs, gallops RESPIRATORY:  Clear to auscultation without rales, wheezing or rhonchi  ABDOMEN: Soft, non-tender, non-distended, +bowel sounds, no guarding. EXTREMITIES: No edema, No cyanosis, no clubbing MUSCULOSKELETAL:  No deformity  SKIN: Warm and dry NEUROLOGIC:  Alert and oriented x 3, non-focal PSYCHIATRIC:  Normal affect, good insight  ASSESSMENT:    1. Coronary artery disease involving native coronary artery of native heart without angina pectoris   2. S/P CABG x 2   3. Essential hypertension   4. Post operative AFIB   5. Mixed hyperlipidemia   6. Depressed left ventricular ejection fraction    PLAN:     Coronary artery disease-the patient tells me that he is doing well he is asymptomatic.  He is recovering well from his surgery.  He started cardiac rehab which he likes and has been doing well with this program.  For now we will continue patient on his aspirin 325 mg, atorvastatin 40 mg and his beta-blocker.  Also keep him on his Imdur 30 mg daily as he tells me that sometimes he does have some twitches in his chest.  Hypertension-his blood pressure seems to be on the lower end with systolic 1 4 today.  But he tells me at times his systolic is in the 140s.  He denies any dizziness.  Informed him to  continue him on his current regimen which includes Coreg 6.25 mg twice daily, losartan 100 mg daily and hydrochlorothiazide 25 mg daily.   Hyperlipidemia continue patient on his atorvastatin 40 mg daily.  I will repeat his lipid panel at his next visit.  His LDL target is less than 70.  If this remains greater than 70 I plan to add Zetia.  Mild depressed EF, continue patient on his beta-blocker  as well as his ARB.  Repeat echocardiogram in 2 months.  Postoperative atrial fibrillation-he is on amiodarone 400 mg daily.  He is in sinus rhythm today.  Going to continue his amiodarone for now I plan to taper him off of this medication in the near future.   The patient is in agreement with the above plan. The patient left the office in stable condition.  The patient will follow up in 2 months or sooner if needed.   Medication Adjustments/Labs and Tests Ordered: Current medicines are reviewed at length with the patient today.  Concerns regarding medicines are outlined above.  Orders Placed This Encounter  Procedures  . EKG 12-Lead   No orders of the defined types were placed in this encounter.   Patient Instructions  Medication Instructions:  Your physician recommends that you continue on your current medications as directed. Please refer to the Current Medication list given to you today.  *If you need a refill on your cardiac medications before your next appointment, please call your pharmacy*   Lab Work: None ordered   If you have labs (blood work) drawn today and your tests are completely normal, you will receive your results only by: Marland Kitchen MyChart Message (if you have MyChart) OR . A paper copy in the mail If you have any lab test that is abnormal or we need to change your treatment, we will call you to review the results.   Testing/Procedures: None ordered    Follow-Up: At [facility], you and your health needs are our priority.  As part of our continuing mission to provide  you with exceptional heart care, we have created designated Provider Care Teams.  These Care Teams include your primary Cardiologist (physician) and Advanced Practice Providers (APPs -  Physician Assistants and Nurse Practitioners) who all work together to provide you with the care you need, when you need it.  We recommend signing up for the patient portal called "MyChart".  Sign up information is provided on this After Visit Summary.  MyChart is used to connect with patients for Virtual Visits (Telemedicine).  Patients are able to view lab/test results, encounter notes, upcoming appointments, etc.  Non-urgent messages can be sent to your provider as well.   To learn more about what you can do with MyChart, go to [email or web address].    Your next appointment:   2 month(s)  The format for your next appointment:   In Person  Provider:   Berniece Salines, DO   Other Instructions None      Adopting a Healthy Lifestyle.  Know what a healthy weight is for you (roughly BMI <25) and aim to maintain this   Aim for 7+ servings of fruits and vegetables daily   65-80+ fluid ounces of water or unsweet tea for healthy kidneys   Limit to max 1 drink of alcohol per day; avoid smoking/tobacco   Limit animal fats in diet for cholesterol and heart health - choose grass fed whenever available   Avoid highly processed foods, and foods high in saturated/trans fats   Aim for low stress - take time to unwind and care for your mental health   Aim for 150 min of moderate intensity exercise weekly for heart health, and weights twice weekly for bone health   Aim for 7-9 hours of sleep daily   When it comes to diets, agreement about the perfect plan isnt easy to find, even among the experts. Experts at the [place] [place] developed  an idea known as the Healthy Eating Plate. Just imagine a plate divided into logical, healthy portions.   The emphasis is on diet quality:   Load up on  vegetables and fruits - one-half of your plate: Aim for color and variety, and remember that potatoes dont count.   Go for whole grains - one-quarter of your plate: Whole wheat, barley, wheat berries, quinoa, oats, brown rice, and foods made with them. If you want pasta, go with whole wheat pasta.   Protein power - one-quarter of your plate: Fish, chicken, beans, and nuts are all healthy, versatile protein sources. Limit red meat.   The diet, however, does go beyond the plate, offering a few other suggestions.   Use healthy plant oils, such as olive, canola, soy, corn, sunflower and peanut. Check the labels, and avoid partially hydrogenated oil, which have unhealthy trans fats.   If youre thirsty, drink water. Coffee and tea are good in moderation, but skip sugary drinks and limit milk and dairy products to one or two daily servings.   The type of carbohydrate in the diet is more important than the amount. Some sources of carbohydrates, such as vegetables, fruits, whole grains, and beans-are healthier than others.   Finally, stay active  Signed, Thomasene RippleKardie Graysyn Bache, DO  06/04/2019 10:07 AM    [facility]

## 2019-06-04 NOTE — Patient Instructions (Signed)
Medication Instructions:  Your physician recommends that you continue on your current medications as directed. Please refer to the Current Medication list given to you today.  *If you need a refill on your cardiac medications before your next appointment, please call your pharmacy*   Lab Work: None ordered   If you have labs (blood work) drawn today and your tests are completely normal, you will receive your results only by: Marland Kitchen MyChart Message (if you have MyChart) OR . A paper copy in the mail If you have any lab test that is abnormal or we need to change your treatment, we will call you to review the results.   Testing/Procedures: None ordered    Follow-Up: At [facility], you and your health needs are our priority.  As part of our continuing mission to provide you with exceptional heart care, we have created designated Provider Care Teams.  These Care Teams include your primary Cardiologist (physician) and Advanced Practice Providers (APPs -  Physician Assistants and Nurse Practitioners) who all work together to provide you with the care you need, when you need it.  We recommend signing up for the patient portal called "MyChart".  Sign up information is provided on this After Visit Summary.  MyChart is used to connect with patients for Virtual Visits (Telemedicine).  Patients are able to view lab/test results, encounter notes, upcoming appointments, etc.  Non-urgent messages can be sent to your provider as well.   To learn more about what you can do with MyChart, go to [email or web address].    Your next appointment:   2 month(s)  The format for your next appointment:   In Person  Provider:   Thomasene Ripple, DO   Other Instructions None

## 2019-06-18 ENCOUNTER — Other Ambulatory Visit: Payer: Self-pay | Admitting: Cardiothoracic Surgery

## 2019-06-18 DIAGNOSIS — Z951 Presence of aortocoronary bypass graft: Secondary | ICD-10-CM

## 2019-06-19 ENCOUNTER — Other Ambulatory Visit: Payer: Self-pay

## 2019-06-19 ENCOUNTER — Ambulatory Visit (INDEPENDENT_AMBULATORY_CARE_PROVIDER_SITE_OTHER): Payer: Self-pay | Admitting: Cardiothoracic Surgery

## 2019-06-19 ENCOUNTER — Encounter: Payer: Self-pay | Admitting: Cardiothoracic Surgery

## 2019-06-19 ENCOUNTER — Ambulatory Visit
Admission: RE | Admit: 2019-06-19 | Discharge: 2019-06-19 | Disposition: A | Discharge status: Home / Self Care | Payer: 59 | Source: Ambulatory Visit | Attending: Cardiothoracic Surgery | Admitting: Cardiothoracic Surgery

## 2019-06-19 VITALS — BP 111/73 | HR 61 | Temp 97.5°F | Resp 16 | Ht 69.0 in | Wt 165.8 lb

## 2019-06-19 DIAGNOSIS — Z951 Presence of aortocoronary bypass graft: Secondary | ICD-10-CM

## 2019-06-19 DIAGNOSIS — I2510 Atherosclerotic heart disease of native coronary artery without angina pectoris: Secondary | ICD-10-CM

## 2019-06-19 DIAGNOSIS — Z09 Encounter for follow-up examination after completed treatment for conditions other than malignant neoplasm: Secondary | ICD-10-CM | POA: Insufficient documentation

## 2019-06-19 NOTE — Progress Notes (Signed)
PCP is Street, Stephanie Coup, MD Referring Provider is Thomasene Ripple, DO  Chief Complaint  Patient presents with  . Routine Post Op    f/u from surgery with CXR s/p Coronary artery bypass grafting x2, 05/14/19     HPI: Patient returns for 1 month follow-up after urgent CABG x2 for unstable angina.  He had left IMA to the LAD and a radial artery free graft to the OM1.  He is doing well without any recurrent chest pain and his surgical incisions are healing well.  He has started cardiac rehab at [facility] and veins on his discharge medications.  I reviewed his chest x-ray taken today which shows clear lung fields, stable cardiac silhouette and sternal wires intact.  No pleural effusions.   Past Medical History:  Diagnosis Date  . Chest pain 05/07/2019  . Coronary artery disease 05/14/2019  . Coronary artery disease involving native heart without angina pectoris   . Essential hypertension 05/07/2019  . Hyperlipidemia   . Near syncope 05/11/2019  . PAF (paroxysmal atrial fibrillation) (HCC)   . Refusal of blood transfusions as patient is Jehovah's Witness 05/11/2019  . 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 Unstable angina (HCC) 05/11/2019    Past Surgical History:  Procedure Laterality Date  . CORONARY ARTERY BYPASS GRAFT N/A 05/14/2019   Procedure: CORONARY ARTERY BYPASS GRAFTING (CABG) x TWO USING LEFT INTERNAL MAMMARY ARTERY AND LEFT RADIAL ARTERY;  Surgeon: Kerin Perna, MD;  Location: [facility] OR;  Service: Open Heart Surgery;  Laterality: N/A;  . LEFT HEART CATH AND CORONARY ANGIOGRAPHY N/A 05/13/2019   Procedure: LEFT HEART CATH AND CORONARY ANGIOGRAPHY;  Surgeon: Runell Gess, MD;  Location: [facility] INVASIVE CV LAB;  Service: Cardiovascular;  Laterality: N/A;  . RADIAL ARTERY HARVEST Left 05/14/2019   Procedure: RADIAL ARTERY HARVEST;  Surgeon: Kerin Perna, MD;  Location[facility] OR;  Service: Open Heart Surgery;  Laterality: Left;  . TEE WITHOUT CARDIOVERSION  N/A 05/14/2019   Procedure: TRANSESOPHAGEAL ECHOCARDIOGRAM (TEE);  Surgeon: Donata Clay, Theron Arista, MD;  Location: [facility] OR;  Service: Open Heart Surgery;  Laterality: N/A;    Family History  Problem Relation Age of Onset  . Hypertension Mother   . Heart attack Father   . Heart attack Brother     Social History Social History   Tobacco Use  . Smoking status: Never Smoker  . Smokeless tobacco: Never Used  Substance Use Topics  . Alcohol use: Not Currently  . Drug use: Never    Current Outpatient Medications  Medication Sig Dispense Refill  . amiodarone (PACERONE) 400 MG tablet Take 1 tablet (400 mg total) by mouth 2 (two) times daily. For 7 days, then convert to 400 mg once daily 70 tablet 0  . aspirin EC 325 MG EC tablet Take 1 tablet (325 mg total) by mouth daily.    Marland Kitchen atorvastatin (LIPITOR) 40 MG tablet Take 1 tablet (40 mg total) by mouth daily at 6 PM. 30 tablet 1  . carvedilol (COREG) 6.25 MG tablet Take 6.25 mg by mouth 2 (two) times daily.    . citalopram (CELEXA) 20 MG tablet Take 20 mg by mouth at bedtime.     . ferrous fumarate-b12-vitamic C-folic acid (TRINSICON / FOLTRIN) capsule Take 1 capsule by mouth 2 (two) times daily after a meal. 60 capsule 1  . hydrOXYzine (ATARAX/VISTARIL) 25 MG tablet Take 25 mg by mouth 3 (three) times daily.    Marland Kitchen  isosorbide mononitrate (IMDUR) 30 MG 24 hr tablet Take 0.5 tablets (15 mg total) by mouth daily. 30 tablet 1  . losartan-hydrochlorothiazide (HYZAAR) 100-25 MG tablet Take 1 tablet by mouth daily.    . traMADol (ULTRAM) 50 MG tablet Take 1 tablet (50 mg total) by mouth every 6 (six) hours as needed for moderate pain. (Patient not taking: Reported on 06/19/2019) 28 tablet 0   No current facility-administered medications for this visit.    Allergies  Allergen Reactions  . Morphine And Related Other (See Comments)    Drops BP     Review of Systems   Generally improved strength and appetite No significant shortness of breath No  edema   BP 111/73 (BP Location: Left Arm, Patient Position: Sitting, Cuff Size: Normal)   Pulse 61   Temp (!) 97.5 F (36.4 C) (Temporal)   Resp 16   Ht 5\' 9"  (1.753 m)   Wt 165 lb 12.8 oz (75.2 kg)   SpO2 95% Comment: RA  BMI 24.48 kg/m  Physical Exam      Exam Sternal and left arm incision well-healed.  General- alert and comfortable    Neck- no JVD, no cervical adenopathy palpable, no carotid bruit   Lungs- clear without rales, wheezes   Cor- regular rate and rhythm, no murmur , gallop   Abdomen- soft, non-tender   Extremities - warm, non-tender, minimal edema.  Left hand well-perfused with good grip   Neuro- oriented, appropriate, no focal weakness   Diagnostic Tests: Chest x-ray clear  Impression: 1 well 1 month after urgent CABG x2 for 90% stenosis of the proximal LAD and circumflex.  Plan: Patient will discontinue amiodarone, Imdur, iron, and hydroxyzine.  He will remain on his statin aspirin carvedilol and Hyzaar.  He can drive and lift up to 10 pounds. The patient return for final review and estimate of progress and to  discuss return to work in 6 weeks.   Len Childs, MD [facility] [phone]

## 2019-06-19 NOTE — Progress Notes (Signed)
Pt w/ 28 staples to L wrist/forearm. Incision is c/d/i and without s/s of infection. All staples are easily removed per standard protocol. Pt instructed to report s/s of infection or other problems.

## 2019-07-23 ENCOUNTER — Telehealth: Payer: Self-pay

## 2019-07-23 NOTE — Telephone Encounter (Signed)
Called pt and left a message tos see how pt is doing with his BP. Pt had an episode in cardiac rehab where his BP was low. Pt told staff at rehab he had decreased his medication on his own without consult. Need to clarify what medication and dosage he is taking as he has a stent.

## 2019-07-25 NOTE — Telephone Encounter (Signed)
Pt states that he has started cutting his medication for his BP in half and takes 1/2 in the morning and the other half in the evening. Pt states that this has been working and his BP is doing better.

## 2019-07-25 NOTE — Telephone Encounter (Signed)
Patient calling back.   °

## 2019-07-31 ENCOUNTER — Encounter: Payer: Self-pay | Admitting: Cardiothoracic Surgery

## 2019-07-31 ENCOUNTER — Other Ambulatory Visit: Payer: Self-pay

## 2019-07-31 ENCOUNTER — Ambulatory Visit (INDEPENDENT_AMBULATORY_CARE_PROVIDER_SITE_OTHER): Payer: Self-pay | Admitting: Cardiothoracic Surgery

## 2019-07-31 VITALS — BP 133/80 | HR 80 | Temp 97.0°F | Resp 16 | Ht 69.0 in | Wt 167.0 lb

## 2019-07-31 DIAGNOSIS — Z5189 Encounter for other specified aftercare: Secondary | ICD-10-CM

## 2019-07-31 DIAGNOSIS — Z951 Presence of aortocoronary bypass graft: Secondary | ICD-10-CM

## 2019-07-31 DIAGNOSIS — Z09 Encounter for follow-up examination after completed treatment for conditions other than malignant neoplasm: Secondary | ICD-10-CM

## 2019-07-31 DIAGNOSIS — I2510 Atherosclerotic heart disease of native coronary artery without angina pectoris: Secondary | ICD-10-CM

## 2019-07-31 NOTE — Progress Notes (Signed)
PCP is Street, Stephanie Coup, MD Referring Provider is Thomasene Ripple, DO  Chief Complaint  Patient presents with  . Routine Post Op    6 week f/u HX of CABG    HPI: Final 12-week follow-up after urgent CABG x2 for left main stenosis.  Left IMA to LAD and radial artery to circumflex.  Patient is doing well without recurrent angina.  He is enrolled in outpatient cardiac rehab and progressing his exercise tolerance.  Surgical incisions are well-healed.  Postoperative chest x-ray at last visit is clear. He has maintained sinus rhythm and will stop the amiodarone that was started for transient postoperative atrial fibrillation.  Past Medical History:  Diagnosis Date  . Chest pain 05/07/2019  . Coronary artery disease 05/14/2019  . Coronary artery disease involving native heart without angina pectoris   . Essential hypertension 05/07/2019  . Hyperlipidemia   . Near syncope 05/11/2019  . PAF (paroxysmal atrial fibrillation) (HCC)   . Refusal of blood transfusions as patient is Jehovah's Witness 05/11/2019  . 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 Unstable angina (HCC) 05/11/2019    Past Surgical History:  Procedure Laterality Date  . CORONARY ARTERY BYPASS GRAFT N/A 05/14/2019   Procedure: CORONARY ARTERY BYPASS GRAFTING (CABG) x TWO USING LEFT INTERNAL MAMMARY ARTERY AND LEFT RADIAL ARTERY;  Surgeon: Kerin Perna, MD;  Location: [facility] OR;  Service: Open Heart Surgery;  Laterality: N/A;  . LEFT HEART CATH AND CORONARY ANGIOGRAPHY N/A 05/13/2019   Procedure: LEFT HEART CATH AND CORONARY ANGIOGRAPHY;  Surgeon: Runell Gess, MD;  Location: [facility] INVASIVE CV LAB;  Service: Cardiovascular;  Laterality: N/A;  . RADIAL ARTERY HARVEST Left 05/14/2019   Procedure: RADIAL ARTERY HARVEST;  Surgeon: Kerin Perna, MD;  Location: [facility] OR;  Service: Open Heart Surgery;  Laterality: Left;  . TEE WITHOUT CARDIOVERSION N/A 05/14/2019   Procedure: TRANSESOPHAGEAL ECHOCARDIOGRAM (TEE);   Surgeon: Donata Clay, Theron Arista, MD;  Location: [facility] OR;  Service: Open Heart Surgery;  Laterality: N/A;    Family History  Problem Relation Age of Onset  . Hypertension Mother   . Heart attack Father   . Heart attack Brother     Social History Social History   Tobacco Use  . Smoking status: Never Smoker  . Smokeless tobacco: Never Used  Substance Use Topics  . Alcohol use: Not Currently  . Drug use: Never    Current Outpatient Medications  Medication Sig Dispense Refill  . amiodarone (PACERONE) 400 MG tablet Take 1 tablet (400 mg total) by mouth 2 (two) times daily. For 7 days, then convert to 400 mg once daily 70 tablet 0  . aspirin EC 325 MG EC tablet Take 1 tablet (325 mg total) by mouth daily.    Marland Kitchen atorvastatin (LIPITOR) 40 MG tablet Take 1 tablet (40 mg total) by mouth daily at 6 PM. 30 tablet 1  . carvedilol (COREG) 6.25 MG tablet Take 6.25 mg by mouth 2 (two) times daily.    . citalopram (CELEXA) 20 MG tablet Take 20 mg by mouth at bedtime.     . hydrOXYzine (ATARAX/VISTARIL) 25 MG tablet Take 25 mg by mouth 3 (three) times daily.    Marland Kitchen losartan-hydrochlorothiazide (HYZAAR) 100-25 MG tablet Take 0.5 tablets by mouth daily.      No current facility-administered medications for this visit.    Allergies  Allergen Reactions  . Morphine And Related Other (See Comments)  Drops BP     Review of Systems  Weight stable No fever No chest pain No leg edema  BP 133/80 (BP Location: Right Arm, Patient Position: Sitting, Cuff Size: Normal)   Pulse 80   Temp (!) 97 F (36.1 C)   Resp 16   Ht 5\' 9"  (1.753 m)   Wt 167 lb (75.8 kg)   SpO2 97% Comment: RA  BMI 24.66 kg/m  Physical Exam      Exam    General- alert and comfortable.  Sternal incision and left arm incision well-healed.    Neck- no JVD, no cervical adenopathy palpable, no carotid bruit   Lungs- clear without rales, wheezes   Cor- regular rate and rhythm, no murmur , gallop   Abdomen- soft, non-tender    Extremities - warm, non-tender, minimal edema   Neuro- oriented, appropriate, no focal weakness   Diagnostic Tests: None  Impression: Excellent recovery after CABG x2 for left main stenosis  Plan: Continue aspirin Lipitor and carvedilol and losartan.  Follow-up with primary care and cardiology.  Patient may lift more than 10 pounds starting August 4 when restrictions are lifted for sternal healing.   03-16-1972, MD [facility] [phone]

## 2019-08-08 ENCOUNTER — Encounter: Payer: Self-pay | Admitting: Cardiology

## 2019-08-08 ENCOUNTER — Other Ambulatory Visit: Payer: Self-pay

## 2019-08-08 ENCOUNTER — Ambulatory Visit (INDEPENDENT_AMBULATORY_CARE_PROVIDER_SITE_OTHER): Payer: 59 | Admitting: Cardiology

## 2019-08-08 VITALS — BP 120/88 | HR 64 | Ht 69.0 in | Wt 168.0 lb

## 2019-08-08 DIAGNOSIS — I480 Paroxysmal atrial fibrillation: Secondary | ICD-10-CM

## 2019-08-08 DIAGNOSIS — I2510 Atherosclerotic heart disease of native coronary artery without angina pectoris: Secondary | ICD-10-CM

## 2019-08-08 DIAGNOSIS — E782 Mixed hyperlipidemia: Secondary | ICD-10-CM

## 2019-08-08 DIAGNOSIS — I10 Essential (primary) hypertension: Secondary | ICD-10-CM | POA: Diagnosis not present

## 2019-08-08 DIAGNOSIS — R0989 Other specified symptoms and signs involving the circulatory and respiratory systems: Secondary | ICD-10-CM

## 2019-08-08 DIAGNOSIS — Z951 Presence of aortocoronary bypass graft: Secondary | ICD-10-CM

## 2019-08-08 DIAGNOSIS — R931 Abnormal findings on diagnostic imaging of heart and coronary circulation: Secondary | ICD-10-CM

## 2019-08-08 MED ORDER — LOSARTAN POTASSIUM 50 MG PO TABS
50.0000 mg | ORAL_TABLET | Freq: Every day | ORAL | 3 refills | Status: DC
Start: 2019-08-08 — End: 2019-12-11

## 2019-08-08 NOTE — Patient Instructions (Signed)
Medication Instructions:  Your physician has recommended you make the following change in your medication:  STOP: Losartan- HCTZ STOP: Amiodarone START: Losartan 50 mg take one tablet by mouth daily.  *If you need a refill on your cardiac medications before your next appointment, please call your pharmacy*   Lab Work: None If you have labs (blood work) drawn today and your tests are completely normal, you will receive your results only by: Marland Kitchen MyChart Message (if you have MyChart) OR . A paper copy in the mail If you have any lab test that is abnormal or we need to change your treatment, we will call you to review the results.   Testing/Procedures: None   Follow-Up: At [facility], you and your health needs are our priority.  As part of our continuing mission to provide you with exceptional heart care, we have created designated Provider Care Teams.  These Care Teams include your primary Cardiologist (physician) and Advanced Practice Providers (APPs -  Physician Assistants and Nurse Practitioners) who all work together to provide you with the care you need, when you need it.  We recommend signing up for the patient portal called "MyChart".  Sign up information is provided on this After Visit Summary.  MyChart is used to connect with patients for Virtual Visits (Telemedicine).  Patients are able to view lab/test results, encounter notes, upcoming appointments, etc.  Non-urgent messages can be sent to your provider as well.   To learn more about what you can do with MyChart, go to [email or web address].    Your next appointment:   1 month(s)  The format for your next appointment:   In Person  Provider:   Thomasene Ripple, DO   Other Instructions

## 2019-08-08 NOTE — Progress Notes (Signed)
Cardiology Office Note:    Date:  08/08/2019   ID:  Juan LuzDana M Gazzola, DOB 01-25-1955, MRN [id number]  PCP:  Street, Stephanie Couphristopher M, MD  Cardiologist:  Thomasene RippleKardie Pratt Bress, DO  Electrophysiologist:  None   Referring MD: Street, Stephanie Couphristopher M, *   " I have been having some dizziness"   History of Present Illness:    Juan Payne is a 64 y.o. male with a hx of coronary artery disease status post CABG with LIMA to LAD, left radial artery to OM2 on May 14, 2019, hyperlipidemia, hypertension, postop A. fib currently on amiodarone presents today for his post operative visit.  I did see the patient on May 07, 2019 at that time he was experiencing significant symptoms therefore I recommended he go [facility] ED.  He was admitted to the hospital and underwent a nuclear stress test which was reported to be positive.  The patient was then transferred [facility] where he underwent left heart catheterization showing multivessel disease.  Was recommended that he undergo coronary artery bypass grafting.  He successfully completed this surgery on May 14, 2019.  Postop stay was complicated by paroxysmal atrial fibrillation he was placed on amiodarone.    I did see the patient on Jun 04, 2019 at that time he was postop doing well, he was doing cardiac rehab he offered no complaints at that time no changes were made in his medications.    Since his visit with me he has seen a cardiothoracic surgeon twice during which time his amiodarone has been stopped due to the maintenance of sinus rhythm.   It is noted that after August 4th he can be able to lift more than 10 lb.  The patient tells me that he has been experiencing some hypotension which resulted to him cutting back on his losartan hydrochlorothiazide pills split in half.  With this his blood pressure has proved as recently is been down in the systolics of 90s.  His main concern concerning is the fact that when he stands up he feels  dizzy.   Past Medical History:  Diagnosis Date  . Chest pain 05/07/2019  . Coronary artery disease 05/14/2019  . Coronary artery disease involving native heart without angina pectoris   . Essential hypertension 05/07/2019  . Hyperlipidemia   . Near syncope 05/11/2019  . PAF (paroxysmal atrial fibrillation) (HCC)   . Refusal of blood transfusions as patient is Jehovah's Witness 05/11/2019  . 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 Unstable angina (HCC) 05/11/2019    Past Surgical History:  Procedure Laterality Date  . CORONARY ARTERY BYPASS GRAFT N/A 05/14/2019   Procedure: CORONARY ARTERY BYPASS GRAFTING (CABG) x TWO USING LEFT INTERNAL MAMMARY ARTERY AND LEFT RADIAL ARTERY;  Surgeon: Kerin PernaVan Trigt, Peter, MD;  [facility];  Service: Open Heart Surgery;  Laterality: N/A;  . LEFT HEART CATH AND CORONARY ANGIOGRAPHY N/A 05/13/2019   Procedure: LEFT HEART CATH AND CORONARY ANGIOGRAPHY;  Surgeon: Runell GessBerry, Jonathan J, MD;  Location: [facility] INVASIVE CV LAB;  Service: Cardiovascular;  Laterality: N/A;  . RADIAL ARTERY HARVEST Left 05/14/2019   Procedure: RADIAL ARTERY HARVEST;  Surgeon: Kerin PernaVan Trigt, Peter, MD;  [facility];  Service: Open Heart Surgery;  Laterality: Left;  . TEE WITHOUT CARDIOVERSION N/A 05/14/2019   Procedure: TRANSESOPHAGEAL ECHOCARDIOGRAM (TEE);  Surgeon: Donata ClayVan Trigt, Theron AristaPeter, MD;  [facility];  Service: Open Heart Surgery;  Laterality: N/A;  Current Medications: Current Meds  Medication Sig  . aspirin EC 325 MG EC tablet Take 1 tablet (325 mg total) by mouth daily.  Marland Kitchen atorvastatin (LIPITOR) 40 MG tablet Take 1 tablet (40 mg total) by mouth daily at 6 PM.  . carvedilol (COREG) 6.25 MG tablet Take 6.25 mg by mouth 2 (two) times daily.  . citalopram (CELEXA) 20 MG tablet Take 20 mg by mouth at bedtime.   . [DISCONTINUED] amiodarone (PACERONE) 400 MG tablet Take 1 tablet (400 mg total) by mouth 2 (two) times daily. For 7 days, then convert to 400 mg once daily  .  [DISCONTINUED] losartan-hydrochlorothiazide (HYZAAR) 100-25 MG tablet Take 0.5 tablets by mouth in the morning and at bedtime.      Allergies:   Morphine and related   Social History   Socioeconomic History  . Marital status: Married    Spouse name: Not on file  . Number of children: Not on file  . Years of education: Not on file  . Highest education level: Not on file  Occupational History  . Not on file  Tobacco Use  . Smoking status: Never Smoker  . Smokeless tobacco: Never Used  Substance and Sexual Activity  . Alcohol use: Not Currently  . Drug use: Never  . Sexual activity: Not on file  Other Topics Concern  . Not on file  Social History Narrative  . Not on file   Social Determinants of Health   Financial Resource Strain:   . Difficulty of Paying Living Expenses:   Food Insecurity:   . Worried About Programme researcher, broadcasting/film/video in the Last Year:   . Barista in the Last Year:   Transportation Needs:   . Freight forwarder (Medical):   Marland Kitchen Lack of Transportation (Non-Medical):   Physical Activity:   . Days of Exercise per Week:   . Minutes of Exercise per Session:   Stress:   . Feeling of Stress :   Social Connections:   . Frequency of Communication with Friends and Family:   . Frequency of Social Gatherings with Friends and Family:   . Attends Religious Services:   . Active Member of Clubs or Organizations:   . Attends Banker Meetings:   Marland Kitchen Marital Status:      Family History: The patient's family history includes Heart attack in his brother and father; Hypertension in his mother.  ROS:   Review of Systems  Constitution: Negative for decreased appetite, fever and weight gain.  HENT: Negative for congestion, ear discharge, hoarse voice and sore throat.   Eyes: Negative for discharge, redness, vision loss in right eye and visual halos.  Cardiovascular: Negative for chest pain, dyspnea on exertion, leg swelling, orthopnea and palpitations.   Respiratory: Negative for cough, hemoptysis, shortness of breath and snoring.   Endocrine: Negative for heat intolerance and polyphagia.  Hematologic/Lymphatic: Negative for bleeding problem. Does not bruise/bleed easily.  Skin: Negative for flushing, nail changes, rash and suspicious lesions.  Musculoskeletal: Negative for arthritis, joint pain, muscle cramps, myalgias, neck pain and stiffness.  Gastrointestinal: Negative for abdominal pain, bowel incontinence, diarrhea and excessive appetite.  Genitourinary: Negative for decreased libido, genital sores and incomplete emptying.  Neurological: Negative for brief paralysis, focal weakness, headaches and loss of balance.  Psychiatric/Behavioral: Negative for altered mental status, depression and suicidal ideas.  Allergic/Immunologic: Negative for HIV exposure and persistent infections.    EKGs/Labs/Other Studies Reviewed:    The following studies were reviewed today:  EKG:  None today   TTE iMPRESSIONS May 13, 2019 1. Left ventricular ejection fraction, by estimation, is 45 to 50%. The  left ventricle has mildly decreased function. The left ventricle demonstrates regional wall motion abnormalities (see scoring diagram/findings for description). Left ventricular diastolic parameters are consistent with Grade I diastolic dysfunction (impaired relaxation).  2. Right ventricular systolic function is normal. The right ventricular size is normal.  3. The mitral valve is normal in structure. Mild mitral valve regurgitation. No evidence of mitral stenosis.  4. The aortic valve is tricuspid. Aortic valve regurgitation is not visualized. No aortic stenosis is present.  5. Aortic dilatation noted. 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  6. The inferior vena cava is normal in size with greater than 50% respiratory variability, suggesting right atrial pressure of 3 mmHg.  Recent Labs: 05/11/2019: ALT 86 05/14/2019: TSH  6.549 05/15/2019: Magnesium 2.4 05/17/2019: BUN 19; Creatinine, Ser 0.75; Hemoglobin 9.7; Platelets 150; Potassium 4.1; Sodium 136  Recent Lipid Panel    Component Value Date/Time   CHOL 189 05/12/2019 0216   TRIG 137 05/12/2019 0216   HDL 51 05/12/2019 0216   CHOLHDL 3.7 05/12/2019 0216   VLDL 27 05/12/2019 0216   LDLCALC 111 (H) 05/12/2019 0216    Physical Exam:    VS:  BP (!) 120/88 (BP Location: Right Arm, Patient Position: Sitting, Cuff Size: Normal)   Pulse 64   Ht 5\' 9"  (1.753 m)   Wt 168 lb (76.2 kg)   SpO2 96%   BMI 24.81 kg/m     Wt Readings from Last 3 Encounters:  08/08/19 168 lb (76.2 kg)  07/31/19 167 lb (75.8 kg)  06/19/19 165 lb 12.8 oz (75.2 kg)     GEN: Well nourished, well developed in no acute distress HEENT: Normal NECK: No JVD; No carotid bruits LYMPHATICS: No lymphadenopathy CARDIAC: S1S2 noted,RRR, no murmurs, rubs, gallops RESPIRATORY:  Clear to auscultation without rales, wheezing or rhonchi  ABDOMEN: Soft, non-tender, non-distended, +bowel sounds, no guarding. EXTREMITIES: No edema, No cyanosis, no clubbing MUSCULOSKELETAL:  No deformity  SKIN: Warm and dry NEUROLOGIC:  Alert and oriented x 3, non-focal PSYCHIATRIC:  Normal affect, good insight  ASSESSMENT:    1. Coronary artery disease involving native coronary artery of native heart without angina pectoris   2. Essential hypertension   3. PAF (paroxysmal atrial fibrillation) (HCC)   4. Depressed left ventricular ejection fraction   5. Mixed hyperlipidemia   6. S/P CABG x 2    PLAN:    I am concerned that he may be experiencing some orthostatic hypotension given his systolics in the 90s and he is symptomatic. I am going to stop his combination pill with losartan-HCTZ.  This time I am going to give the patient losartan 50 mg daily.   Coronary artery disease-he has no anginal symptoms continue patient his current medication regimen.  He recently saw CT surgery with good report.  He was  told that he can lift greater than 10 pounds after August 4.  He is excited about this.  He is really happy with his progress at rehab.  At this time I will continue his aspirin , Lipitor 40 mg daily.  Paroxysmal atrial fibrillation postoperatively seems to have resolved.  He is off of the amiodarone.  We will continue to monitor the patient.  Depressed ejection fraction-at his visit next month were going to order an echocardiogram to reassess his LV function  The patient is  in agreement with the above plan. The patient left the office in stable condition.  The patient will follow up in 1 month due to blood pressure change.   Medication Adjustments/Labs and Tests Ordered: Current medicines are reviewed at length with the patient today.  Concerns regarding medicines are outlined above.  No orders of the defined types were placed in this encounter.  Meds ordered this encounter  Medications  . losartan (COZAAR) 50 MG tablet    Sig: Take 1 tablet (50 mg total) by mouth daily.    Dispense:  90 tablet    Refill:  3    Patient Instructions  Medication Instructions:  Your physician has recommended you make the following change in your medication:  STOP: Losartan- HCTZ STOP: Amiodarone START: Losartan 50 mg take one tablet by mouth daily.  *If you need a refill on your cardiac medications before your next appointment, please call your pharmacy*   Lab Work: None If you have labs (blood work) drawn today and your tests are completely normal, you will receive your results only by: Marland Kitchen MyChart Message (if you have MyChart) OR . A paper copy in the mail If you have any lab test that is abnormal or we need to change your treatment, we will call you to review the results.   Testing/Procedures: None   Follow-Up: At [facility], you and your health needs are our priority.  As part of our continuing mission to provide you with exceptional heart care, we have created designated Provider Care  Teams.  These Care Teams include your primary Cardiologist (physician) and Advanced Practice Providers (APPs -  Physician Assistants and Nurse Practitioners) who all work together to provide you with the care you need, when you need it.  We recommend signing up for the patient portal called "MyChart".  Sign up information is provided on this After Visit Summary.  MyChart is used to connect with patients for Virtual Visits (Telemedicine).  Patients are able to view lab/test results, encounter notes, upcoming appointments, etc.  Non-urgent messages can be sent to your provider as well.   To learn more about what you can do with MyChart, go to [email or web address].    Your next appointment:   1 month(s)  The format for your next appointment:   In Person  Provider:   Thomasene Ripple, DO   Other Instructions      Adopting a Healthy Lifestyle.  Know what a healthy weight is for you (roughly BMI <25) and aim to maintain this   Aim for 7+ servings of fruits and vegetables daily   65-80+ fluid ounces of water or unsweet tea for healthy kidneys   Limit to max 1 drink of alcohol per day; avoid smoking/tobacco   Limit animal fats in diet for cholesterol and heart health - choose grass fed whenever available   Avoid highly processed foods, and foods high in saturated/trans fats   Aim for low stress - take time to unwind and care for your mental health   Aim for 150 min of moderate intensity exercise weekly for heart health, and weights twice weekly for bone health   Aim for 7-9 hours of sleep daily   When it comes to diets, agreement about the perfect plan isnt easy to find, even among the experts. Experts at the [facility] of [employer] developed an idea known as the Healthy Eating Plate. Just imagine a plate divided into logical, healthy portions.   The emphasis is on diet quality:  Load up on vegetables and fruits - one-half of your plate: Aim for color and variety, and  remember that potatoes dont count.   Go for whole grains - one-quarter of your plate: Whole wheat, barley, wheat berries, quinoa, oats, brown rice, and foods made with them. If you want pasta, go with whole wheat pasta.   Protein power - one-quarter of your plate: Fish, chicken, beans, and nuts are all healthy, versatile protein sources. Limit red meat.   The diet, however, does go beyond the plate, offering a few other suggestions.   Use healthy plant oils, such as olive, canola, soy, corn, sunflower and peanut. Check the labels, and avoid partially hydrogenated oil, which have unhealthy trans fats.   If youre thirsty, drink water. Coffee and tea are good in moderation, but skip sugary drinks and limit milk and dairy products to one or two daily servings.   The type of carbohydrate in the diet is more important than the amount. Some sources of carbohydrates, such as vegetables, fruits, whole grains, and beans-are healthier than others.   Finally, stay active  Signed, Thomasene Ripple, DO  08/08/2019 1:59 PM    [facility]

## 2019-09-11 ENCOUNTER — Other Ambulatory Visit: Payer: Self-pay

## 2019-09-11 ENCOUNTER — Ambulatory Visit (INDEPENDENT_AMBULATORY_CARE_PROVIDER_SITE_OTHER): Payer: 59 | Admitting: Cardiology

## 2019-09-11 ENCOUNTER — Encounter: Payer: Self-pay | Admitting: Cardiology

## 2019-09-11 VITALS — BP 128/82 | HR 55 | Ht 69.0 in | Wt 169.2 lb

## 2019-09-11 DIAGNOSIS — E782 Mixed hyperlipidemia: Secondary | ICD-10-CM

## 2019-09-11 DIAGNOSIS — I2510 Atherosclerotic heart disease of native coronary artery without angina pectoris: Secondary | ICD-10-CM

## 2019-09-11 DIAGNOSIS — I10 Essential (primary) hypertension: Secondary | ICD-10-CM | POA: Diagnosis not present

## 2019-09-11 NOTE — Patient Instructions (Signed)
Medication Instructions:  No medication changes. *If you need a refill on your cardiac medications before your next appointment, please call your pharmacy*   Lab Work: None ordered If you have labs (blood work) drawn today and your tests are completely normal, you will receive your results only by: . MyChart Message (if you have MyChart) OR . A paper copy in the mail If you have any lab test that is abnormal or we need to change your treatment, we will call you to review the results.   Testing/Procedures: None ordered   Follow-Up: At [facility], you and your health needs are our priority.  As part of our continuing mission to provide you with exceptional heart care, we have created designated Provider Care Teams.  These Care Teams include your primary Cardiologist (physician) and Advanced Practice Providers (APPs -  Physician Assistants and Nurse Practitioners) who all work together to provide you with the care you need, when you need it.  We recommend signing up for the patient portal called "MyChart".  Sign up information is provided on this After Visit Summary.  MyChart is used to connect with patients for Virtual Visits (Telemedicine).  Patients are able to view lab/test results, encounter notes, upcoming appointments, etc.  Non-urgent messages can be sent to your provider as well.   To learn more about what you can do with MyChart, go to [email or web address].    Your next appointment:   3 month(s)  The format for your next appointment:   In Person  Provider:   Kardie Tobb, DO   Other Instructions NA 

## 2019-09-11 NOTE — Progress Notes (Signed)
Cardiology Office Note:    Date:  09/11/2019   ID:  Juan Payne, DOB [birth year], MRN [id number]  PCP:  Street, Juan Coup, MD  Cardiologist:  Juan Ripple, DO  Electrophysiologist:  None   Referring MD: Street, Juan Payne, *   Follow up visit   History of Present Illness:    Juan Piechota Stickleris a 64 y.o.malewith a hx of coronary artery disease status post CABG with LIMA to LAD, left radial artery to [facility] May 14, 2019, hyperlipidemia, hypertension, postop A. fib currently on amiodarone presents today for his post operative visit.  I did see the patient on May 07, 2019 at that time he was experiencing significant symptoms therefore I recommended he go to [facility] ED. He was admitted to the hospital and underwent a nuclear stress test which was reported to be positive. The patient was then transferred to [facility] where he underwent left heart catheterization showing multivessel disease. Was recommended that he undergo coronary artery bypass grafting. He successfully completed this surgery on May 14, 2019. Postop stay was complicated by paroxysmal atrial fibrillation he was placed on amiodarone.   I did see the patient on Jun 04, 2019 at that time he was postop doing well, he was doing cardiac rehab he offered no complaints at that time no changes were made in his medications.    Since his visit with me he has seen a cardiothoracic surgeon twice during which time his amiodarone has been stopped due to the maintenance of sinus rhythm.   It is noted that after August 4th he can be able to lift more than 10 lb.  I saw the patient on August 08, 2019 at that time he was dizzy, had several stopped combination pill hydrochlorothiazide- losartan.  And only resume losartan at 50 mg daily.  He is here today for follow-up visit.  He tells me today he has been doing well from a cardiovascular standpoint.  The dizziness has improved since he stopped taking the  hydrochlorothiazide.  No chest pain no shortness of breath.  He is back to work and he is working his way up to 2 his normal hours.  Past Medical History:  Diagnosis Date  . Chest pain 05/07/2019  . Coronary artery disease 05/14/2019  . Coronary artery disease involving native heart without angina pectoris   . Essential hypertension 05/07/2019  . Hyperlipidemia   . Near syncope 05/11/2019  . PAF (paroxysmal atrial fibrillation) (HCC)   . Refusal of blood transfusions as patient is Jehovah's Witness 05/11/2019  . 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 Unstable angina (HCC) 05/11/2019    Past Surgical History:  Procedure Laterality Date  . CORONARY ARTERY BYPASS GRAFT N/A 05/14/2019   Procedure: CORONARY ARTERY BYPASS GRAFTING (CABG) x TWO USING LEFT INTERNAL MAMMARY ARTERY AND LEFT RADIAL ARTERY;  Surgeon: Kerin Perna, MD;  Location: [facility] OR;  Service: Open Heart Surgery;  Laterality: N/A;  . LEFT HEART CATH AND CORONARY ANGIOGRAPHY N/A 05/13/2019   Procedure: LEFT HEART CATH AND CORONARY ANGIOGRAPHY;  Surgeon: Runell Gess, MD;  Location: [facility] INVASIVE CV LAB;  Service: Cardiovascular;  Laterality: N/A;  . RADIAL ARTERY HARVEST Left 05/14/2019   Procedure: RADIAL ARTERY HARVEST;  Surgeon: Kerin Perna, MD;  Location: [facility] OR;  Service: Open Heart Surgery;  Laterality: Left;  . TEE WITHOUT CARDIOVERSION N/A 05/14/2019   Procedure: TRANSESOPHAGEAL ECHOCARDIOGRAM (TEE);  Surgeon: Zenaida Niece  Greg Cutter, MD;  Location: [facility] OR;  Service: Open Heart Surgery;  Laterality: N/A;    Current Medications: Current Meds  Medication Sig  . aspirin EC 325 MG EC tablet Take 1 tablet (325 mg total) by mouth daily.  Marland Kitchen atorvastatin (LIPITOR) 40 MG tablet Take 1 tablet (40 mg total) by mouth daily at 6 PM.  . carvedilol (COREG) 6.25 MG tablet Take 6.25 mg by mouth 2 (two) times daily.  . citalopram (CELEXA) 20 MG tablet Take 20 mg by mouth at bedtime.   Marland Kitchen losartan (COZAAR) 50 MG tablet Take  1 tablet (50 mg total) by mouth daily.     Allergies:   Morphine and related   Social History   Socioeconomic History  . Marital status: Married    Spouse name: Not on file  . Number of children: Not on file  . Years of education: Not on file  . Highest education level: Not on file  Occupational History  . Not on file  Tobacco Use  . Smoking status: Never Smoker  . Smokeless tobacco: Never Used  Substance and Sexual Activity  . Alcohol use: Not Currently  . Drug use: Never  . Sexual activity: Not on file  Other Topics Concern  . Not on file  Social History Narrative  . Not on file   Social Determinants of Health   Financial Resource Strain:   . Difficulty of Paying Living Expenses: Not on file  Food Insecurity:   . Worried About Programme researcher, broadcasting/film/video in the Last Year: Not on file  . Ran Out of Food in the Last Year: Not on file  Transportation Needs:   . Lack of Transportation (Medical): Not on file  . Lack of Transportation (Non-Medical): Not on file  Physical Activity:   . Days of Exercise per Week: Not on file  . Minutes of Exercise per Session: Not on file  Stress:   . Feeling of Stress : Not on file  Social Connections:   . Frequency of Communication with Friends and Family: Not on file  . Frequency of Social Gatherings with Friends and Family: Not on file  . Attends Religious Services: Not on file  . Active Member of Clubs or Organizations: Not on file  . Attends Banker Meetings: Not on file  . Marital Status: Not on file     Family History: The patient's family history includes Heart attack in his brother and father; Hypertension in his mother.  ROS:   Review of Systems  Constitution: Negative for decreased appetite, fever and weight gain.  HENT: Negative for congestion, ear discharge, hoarse voice and sore throat.   Eyes: Negative for discharge, redness, vision loss in right eye and visual halos.  Cardiovascular: Negative for chest pain,  dyspnea on exertion, leg swelling, orthopnea and palpitations.  Respiratory: Negative for cough, hemoptysis, shortness of breath and snoring.   Endocrine: Negative for heat intolerance and polyphagia.  Hematologic/Lymphatic: Negative for bleeding problem. Does not bruise/bleed easily.  Skin: Negative for flushing, nail changes, rash and suspicious lesions.  Musculoskeletal: Negative for arthritis, joint pain, muscle cramps, myalgias, neck pain and stiffness.  Gastrointestinal: Negative for abdominal pain, bowel incontinence, diarrhea and excessive appetite.  Genitourinary: Negative for decreased libido, genital sores and incomplete emptying.  Neurological: Negative for brief paralysis, focal weakness, headaches and loss of balance.  Psychiatric/Behavioral: Negative for altered mental status, depression and suicidal ideas.  Allergic/Immunologic: Negative for HIV exposure and persistent infections.  EKGs/Labs/Other Studies Reviewed:    The following studies were reviewed today:   EKG:  The ekg ordered today demonstrates sinus bradycardia, heart rate 55 bpm with arrhythmia.  Recent Labs: 05/11/2019: ALT 86 05/14/2019: TSH 6.549 05/15/2019: Magnesium 2.4 05/17/2019: BUN 19; Creatinine, Ser 0.75; Hemoglobin 9.7; Platelets 150; Potassium 4.1; Sodium 136  Recent Lipid Panel    Component Value Date/Time   CHOL 189 05/12/2019 0216   TRIG 137 05/12/2019 0216   HDL 51 05/12/2019 0216   CHOLHDL 3.7 05/12/2019 0216   VLDL 27 05/12/2019 0216   LDLCALC 111 (H) 05/12/2019 0216    Physical Exam:    VS:  BP 128/82   Pulse (!) 55   Ht 5\' 9"  (1.753 m)   Wt 169 lb 3.2 oz (76.7 kg)   SpO2 97%   BMI 24.99 kg/m     Wt Readings from Last 3 Encounters:  09/11/19 169 lb 3.2 oz (76.7 kg)  08/08/19 168 lb (76.2 kg)  07/31/19 167 lb (75.8 kg)     GEN: Well nourished, well developed in no acute distress HEENT: Normal NECK: No JVD; No carotid bruits LYMPHATICS: No lymphadenopathy CARDIAC: S1S2  noted,RRR, no murmurs, rubs, gallops RESPIRATORY:  Clear to auscultation without rales, wheezing or rhonchi  ABDOMEN: Soft, non-tender, non-distended, +bowel sounds, no guarding. EXTREMITIES: No edema, No cyanosis, no clubbing MUSCULOSKELETAL:  No deformity  SKIN: Warm and dry NEUROLOGIC:  Alert and oriented x 3, non-focal PSYCHIATRIC:  Normal affect, good insight  ASSESSMENT:    1. Coronary artery disease involving native coronary artery of native heart without angina pectoris   2. Essential hypertension   3. Mixed hyperlipidemia    PLAN:     He appears to be doing well from a cardiovascular standpoint.  He tells me that he is back to work he is doing his activities and is happy with his health right now.  We are going to continue his current medication regimen.  We will reassess his lipid profile for his LDL as his LDL goal is less than 70.  He is on Lipitor 40 mg we will continue that for now.  He plans to come tomorrow fasting to get this done.  Should this be greater than 50 I plan to add Zetia 10 mg to his regimen.  No anginal symptoms at this time.  The patient is in agreement with the above plan. The patient left the office in stable condition.  The patient will follow up in 3 months or sooner if needed.   Medication Adjustments/Labs and Tests Ordered: Current medicines are reviewed at length with the patient today.  Concerns regarding medicines are outlined above.  Orders Placed This Encounter  Procedures  . Lipid panel  . EKG 12-Lead   No orders of the defined types were placed in this encounter.   Patient Instructions  Medication Instructions:  No medication changes. *If you need a refill on your cardiac medications before your next appointment, please call your pharmacy*   Lab Work: None ordered If you have labs (blood work) drawn today and your tests are completely normal, you will receive your results only by: Marland Kitchen. MyChart Message (if you have MyChart) OR . A  paper copy in the mail If you have any lab test that is abnormal or we need to change your treatment, we will call you to review the results.   Testing/Procedures: None ordered   Follow-Up: At [facility], you and your health needs are our priority.  As  part of our continuing mission to provide you with exceptional heart care, we have created designated Provider Care Teams.  These Care Teams include your primary Cardiologist (physician) and Advanced Practice Providers (APPs -  Physician Assistants and Nurse Practitioners) who all work together to provide you with the care you need, when you need it.  We recommend signing up for the patient portal called "MyChart".  Sign up information is provided on this After Visit Summary.  MyChart is used to connect with patients for Virtual Visits (Telemedicine).  Patients are able to view lab/test results, encounter notes, upcoming appointments, etc.  Non-urgent messages can be sent to your provider as well.   To learn more about what you can do with MyChart, go to [email or web address].    Your next appointment:   3 month(s)  The format for your next appointment:   In Person  Provider:   Thomasene Ripple, DO   Other Instructions NA     Adopting a Healthy Lifestyle.  Know what a healthy weight is for you (roughly BMI <25) and aim to maintain this   Aim for 7+ servings of fruits and vegetables daily   65-80+ fluid ounces of water or unsweet tea for healthy kidneys   Limit to max 1 drink of alcohol per day; avoid smoking/tobacco   Limit animal fats in diet for cholesterol and heart health - choose grass fed whenever available   Avoid highly processed foods, and foods high in saturated/trans fats   Aim for low stress - take time to unwind and care for your mental health   Aim for 150 min of moderate intensity exercise weekly for heart health, and weights twice weekly for bone health   Aim for 7-9 hours of sleep daily   When it comes  to diets, agreement about the perfect plan isnt easy to find, even among the experts. Experts at the [facility] of [employer] developed an idea known as the Healthy Eating Plate. Just imagine a plate divided into logical, healthy portions.   The emphasis is on diet quality:   Load up on vegetables and fruits - one-half of your plate: Aim for color and variety, and remember that potatoes dont count.   Go for whole grains - one-quarter of your plate: Whole wheat, barley, wheat berries, quinoa, oats, brown rice, and foods made with them. If you want pasta, go with whole wheat pasta.   Protein power - one-quarter of your plate: Fish, chicken, beans, and nuts are all healthy, versatile protein sources. Limit red meat.   The diet, however, does go beyond the plate, offering a few other suggestions.   Use healthy plant oils, such as olive, canola, soy, corn, sunflower and peanut. Check the labels, and avoid partially hydrogenated oil, which have unhealthy trans fats.   If youre thirsty, drink water. Coffee and tea are good in moderation, but skip sugary drinks and limit milk and dairy products to one or two daily servings.   The type of carbohydrate in the diet is more important than the amount. Some sources of carbohydrates, such as vegetables, fruits, whole grains, and beans-are healthier than others.   Finally, stay active  Signed, Juan Ripple, DO  09/11/2019 9:40 AM    [facility]

## 2019-09-12 LAB — LIPID PANEL
Chol/HDL Ratio: 3.1 ratio (ref 0.0–5.0)
Cholesterol, Total: 147 mg/dL (ref 100–199)
HDL: 48 mg/dL (ref >39)
LDL Chol Calc (NIH): 85 mg/dL (ref 0–99)
Triglycerides: 71 mg/dL (ref 0–149)
VLDL Cholesterol Cal: 14 mg/dL (ref 5–40)

## 2019-09-13 ENCOUNTER — Telehealth: Payer: Self-pay

## 2019-09-13 NOTE — Telephone Encounter (Signed)
Left message on patients voicemail to please return our call.   

## 2019-09-13 NOTE — Telephone Encounter (Signed)
-----   Message from Kardie Tobb, DO sent at 09/12/2019 11:11 PM EDT ----- Your LDL improved but still above 70. I will like to add zetia 10 mg daily to your medicines to help. 

## 2019-09-17 ENCOUNTER — Telehealth: Payer: Self-pay

## 2019-09-17 MED ORDER — EZETIMIBE 10 MG PO TABS
10.0000 mg | ORAL_TABLET | Freq: Every day | ORAL | 3 refills | Status: DC
Start: 2019-09-17 — End: 2019-12-11

## 2019-09-17 NOTE — Telephone Encounter (Signed)
-----   Message from Thomasene Ripple, DO sent at 09/12/2019 11:11 PM EDT ----- Your LDL improved but still above 70. I will like to add zetia 10 mg daily to your medicines to help.

## 2019-09-17 NOTE — Telephone Encounter (Signed)
Spoke with patient regarding results and recommendation.  Patient verbalizes understanding and is agreeable to plan of care. Advised patient to call back with any issues or concerns.  

## 2019-12-09 ENCOUNTER — Other Ambulatory Visit: Payer: Self-pay

## 2019-12-09 DIAGNOSIS — E785 Hyperlipidemia, unspecified: Secondary | ICD-10-CM | POA: Insufficient documentation

## 2019-12-11 ENCOUNTER — Other Ambulatory Visit: Payer: Self-pay

## 2019-12-11 ENCOUNTER — Ambulatory Visit (INDEPENDENT_AMBULATORY_CARE_PROVIDER_SITE_OTHER): Payer: PPO | Admitting: Cardiology

## 2019-12-11 ENCOUNTER — Encounter: Payer: Self-pay | Admitting: Cardiology

## 2019-12-11 VITALS — BP 146/82 | HR 60 | Ht 69.0 in | Wt 171.2 lb

## 2019-12-11 DIAGNOSIS — I2510 Atherosclerotic heart disease of native coronary artery without angina pectoris: Secondary | ICD-10-CM

## 2019-12-11 DIAGNOSIS — E782 Mixed hyperlipidemia: Secondary | ICD-10-CM

## 2019-12-11 DIAGNOSIS — I10 Essential (primary) hypertension: Secondary | ICD-10-CM

## 2019-12-11 LAB — BASIC METABOLIC PANEL
BUN/Creatinine Ratio: 11 (ref 10–24)
BUN: 10 mg/dL (ref 8–27)
CO2: 27 mmol/L (ref 20–29)
Calcium: 9.4 mg/dL (ref 8.6–10.2)
Chloride: 102 mmol/L (ref 96–106)
Creatinine, Ser: 0.9 mg/dL (ref 0.76–1.27)
GFR calc Af Amer: 104 mL/min/{1.73_m2} (ref >59)
GFR calc non Af Amer: 90 mL/min/{1.73_m2} (ref >59)
Glucose: 91 mg/dL (ref 65–99)
Potassium: 4.7 mmol/L (ref 3.5–5.2)
Sodium: 140 mmol/L (ref 134–144)

## 2019-12-11 LAB — MAGNESIUM: Magnesium: 2 mg/dL (ref 1.6–2.3)

## 2019-12-11 MED ORDER — EZETIMIBE 10 MG PO TABS: 10.0000 mg | ORAL_TABLET | Freq: Every day | ORAL | 3 refills | Status: DC

## 2019-12-11 MED ORDER — CARVEDILOL 6.25 MG PO TABS: 6.2500 mg | ORAL_TABLET | Freq: Two times a day (BID) | ORAL | 3 refills | Status: DC

## 2019-12-11 MED ORDER — ATORVASTATIN CALCIUM 40 MG PO TABS: 40.0000 mg | ORAL_TABLET | Freq: Every day | ORAL | 3 refills | Status: DC

## 2019-12-11 MED ORDER — LOSARTAN POTASSIUM 50 MG PO TABS
ORAL_TABLET | ORAL | 3 refills | Status: DC
Start: 2019-12-11 — End: 2020-11-02

## 2019-12-11 NOTE — Patient Instructions (Addendum)
Medication Instructions:  Your physician has recommended you make the following change in your medication:  INCREASE: Losartan 50 mg take one tablet by mouth daily in the morning. Then take 1/2 tablet by mouth daily in the evening.  STOP: Aspirin 325 mg START: Aspirin 81 mg take one tablet by mouth daily.  *If you need a refill on your cardiac medications before your next appointment, please call your pharmacy*   Lab Work: Your physician recommends that you return for lab work in: TODAY BMP, Mag If you have labs (blood work) drawn today and your tests are completely normal, you will receive your results only by: Marland Kitchen MyChart Message (if you have MyChart) OR . A paper copy in the mail If you have any lab test that is abnormal or we need to change your treatment, we will call you to review the results.   Testing/Procedures: None   Follow-Up: At [facility], you and your health needs are our priority.  As part of our continuing mission to provide you with exceptional heart care, we have created designated Provider Care Teams.  These Care Teams include your primary Cardiologist (physician) and Advanced Practice Providers (APPs -  Physician Assistants and Nurse Practitioners) who all work together to provide you with the care you need, when you need it.  We recommend signing up for the patient portal called "MyChart".  Sign up information is provided on this After Visit Summary.  MyChart is used to connect with patients for Virtual Visits (Telemedicine).  Patients are able to view lab/test results, encounter notes, upcoming appointments, etc.  Non-urgent messages can be sent to your provider as well.   To learn more about what you can do with MyChart, go to [email or web address].    Your next appointment:   6 month(s)   The format for your next appointment:   In Person  Provider:   Thomasene Ripple, DO   Other Instructions

## 2019-12-11 NOTE — Progress Notes (Signed)
Cardiology Office Note:    Date:  12/11/2019   ID:  Juan Payne, DOB 09-15-1955, MRN [id number]  PCP:  Street, Stephanie Coup, MD  Cardiologist:  Thomasene Ripple, DO  Electrophysiologist:  None   Referring MD: Street, Stephanie Coup, *   " I am doing well"   History of Present Illness:    Juan Payne is a 64 y.o. male with a hx of hx of coronary artery disease status post CABG with LIMA to LAD, left radial artery to [facility] May 14, 2019, hyperlipidemia, hypertension, postop A. fib currently on amiodarone presents today for his post operative visit.  I did see the patient on May 07, 2019 at that time he was experiencing significant symptoms therefore I recommended he go to [facility] ED. He was admitted to the hospital and underwent a nuclear stress test which was reported to be positive. The patient was then transferred to [facility] where he underwent left heart catheterization showing multivessel disease. Was recommended that he undergo coronary artery bypass grafting. He successfully completed this surgery on May 14, 2019. Postop stay was complicated by paroxysmal atrial fibrillation he was placed on amiodarone.  I did see the patient on Jun 04, 2019 at that time he was postop doing well, he was doing cardiac rehab he offered no complaints at that time no changes were made in his medications.   Since his visit with me he has seen a cardiothoracic surgeon twice during which time his amiodarone has been stopped due to the maintenance of sinus rhythm. It is noted that after August 4th he can be able to lift more than 10 lb.  I saw the patient on August 08, 2019 at that time he was dizzy, had several stopped combination pill hydrochlorothiazide- losartan.  And only resume losartan at 50 mg daily.   At his last visit we repeated his lipid profile and added the patient on Zetia.  He is here today for follow-up visit.  He tells me today he has been doing well from  a cardiovascular standpoint.  Past Medical History:  Diagnosis Date  . Chest pain 05/07/2019  . Coronary artery disease 05/14/2019  . Coronary artery disease involving native heart without angina pectoris   . Essential hypertension 05/07/2019  . Hyperlipidemia   . Near syncope 05/11/2019  . PAF (paroxysmal atrial fibrillation) (HCC)   . Refusal of blood transfusions as patient is Jehovah's Witness 05/11/2019  . 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 Unstable angina (HCC) 05/11/2019    Past Surgical History:  Procedure Laterality Date  . CORONARY ARTERY BYPASS GRAFT N/A 05/14/2019   Procedure: CORONARY ARTERY BYPASS GRAFTING (CABG) x TWO USING LEFT INTERNAL MAMMARY ARTERY AND LEFT RADIAL ARTERY;  Surgeon: Kerin Perna, MD;  Location: [facility] OR;  Service: Open Heart Surgery;  Laterality: N/A;  . LEFT HEART CATH AND CORONARY ANGIOGRAPHY N/A 05/13/2019   Procedure: LEFT HEART CATH AND CORONARY ANGIOGRAPHY;  Surgeon: Runell Gess, MD;  Location: [facility] INVASIVE CV LAB;  Service: Cardiovascular;  Laterality: N/A;  . RADIAL ARTERY HARVEST Left 05/14/2019   Procedure: RADIAL ARTERY HARVEST;  Surgeon: Kerin Perna, MD;  Location: [facility] OR;  Service: Open Heart Surgery;  Laterality: Left;  . TEE WITHOUT CARDIOVERSION N/A 05/14/2019   Procedure: TRANSESOPHAGEAL ECHOCARDIOGRAM (TEE);  Surgeon: Donata Clay, Theron Arista, MD;  Location: [facility] OR;  Service: Open Heart Surgery;  Laterality: N/A;  Current Medications: Current Meds  Medication Sig  . aspirin EC 81 MG tablet Take 81 mg by mouth daily. Swallow whole.  Marland Kitchen. atorvastatin (LIPITOR) 40 MG tablet Take 1 tablet (40 mg total) by mouth daily at 6 PM.  . carvedilol (COREG) 6.25 MG tablet Take 1 tablet (6.25 mg total) by mouth 2 (two) times daily.  . citalopram (CELEXA) 20 MG tablet Take 20 mg by mouth at bedtime.   Marland Kitchen. ezetimibe (ZETIA) 10 MG tablet Take 1 tablet (10 mg total) by mouth daily.  . hydrOXYzine (ATARAX/VISTARIL) 25 MG tablet Take 25  mg by mouth 3 (three) times daily.  . [DISCONTINUED] aspirin EC 325 MG EC tablet Take 1 tablet (325 mg total) by mouth daily.  . [DISCONTINUED] atorvastatin (LIPITOR) 40 MG tablet Take 1 tablet (40 mg total) by mouth daily at 6 PM.  . [DISCONTINUED] carvedilol (COREG) 6.25 MG tablet Take 6.25 mg by mouth 2 (two) times daily.  . [DISCONTINUED] ezetimibe (ZETIA) 10 MG tablet Take 1 tablet (10 mg total) by mouth daily.  . [DISCONTINUED] losartan (COZAAR) 50 MG tablet Take 1 tablet (50 mg total) by mouth daily.     Allergies:   Morphine and related   Social History   Socioeconomic History  . Marital status: Married    Spouse name: Not on file  . Number of children: Not on file  . Years of education: Not on file  . Highest education level: Not on file  Occupational History  . Not on file  Tobacco Use  . Smoking status: Never Smoker  . Smokeless tobacco: Never Used  Substance and Sexual Activity  . Alcohol use: Not Currently  . Drug use: Never  . Sexual activity: Not on file  Other Topics Concern  . Not on file  Social History Narrative  . Not on file   Social Determinants of Health   Financial Resource Strain:   . Difficulty of Paying Living Expenses: Not on file  Food Insecurity:   . Worried About Programme researcher, broadcasting/film/videounning Out of Food in the Last Year: Not on file  . Ran Out of Food in the Last Year: Not on file  Transportation Needs:   . Lack of Transportation (Medical): Not on file  . Lack of Transportation (Non-Medical): Not on file  Physical Activity:   . Days of Exercise per Week: Not on file  . Minutes of Exercise per Session: Not on file  Stress:   . Feeling of Stress : Not on file  Social Connections:   . Frequency of Communication with Friends and Family: Not on file  . Frequency of Social Gatherings with Friends and Family: Not on file  . Attends Religious Services: Not on file  . Active Member of Clubs or Organizations: Not on file  . Attends BankerClub or Organization Meetings:  Not on file  . Marital Status: Not on file     Family History: The patient's family history includes Heart attack in his brother and father; Hypertension in his mother.  ROS:   Review of Systems  Constitution: Negative for decreased appetite, fever and weight gain.  HENT: Negative for congestion, ear discharge, hoarse voice and sore throat.   Eyes: Negative for discharge, redness, vision loss in right eye and visual halos.  Cardiovascular: Negative for chest pain, dyspnea on exertion, leg swelling, orthopnea and palpitations.  Respiratory: Negative for cough, hemoptysis, shortness of breath and snoring.   Endocrine: Negative for heat intolerance and polyphagia.  Hematologic/Lymphatic: Negative for bleeding problem.  Does not bruise/bleed easily.  Skin: Negative for flushing, nail changes, rash and suspicious lesions.  Musculoskeletal: Negative for arthritis, joint pain, muscle cramps, myalgias, neck pain and stiffness.  Gastrointestinal: Negative for abdominal pain, bowel incontinence, diarrhea and excessive appetite.  Genitourinary: Negative for decreased libido, genital sores and incomplete emptying.  Neurological: Negative for brief paralysis, focal weakness, headaches and loss of balance.  Psychiatric/Behavioral: Negative for altered mental status, depression and suicidal ideas.  Allergic/Immunologic: Negative for HIV exposure and persistent infections.    EKGs/Labs/Other Studies Reviewed:    The following studies were reviewed today:   EKG: None today  Recent Labs: 05/11/2019: ALT 86 05/14/2019: TSH 6.549 05/15/2019: Magnesium 2.4 05/17/2019: BUN 19; Creatinine, Ser 0.75; Hemoglobin 9.7; Platelets 150; Potassium 4.1; Sodium 136  Recent Lipid Panel    Component Value Date/Time   CHOL 147 09/12/2019 0911   TRIG 71 09/12/2019 0911   HDL 48 09/12/2019 0911   CHOLHDL 3.1 09/12/2019 0911   CHOLHDL 3.7 05/12/2019 0216   VLDL 27 05/12/2019 0216   LDLCALC 85 09/12/2019 0911     Physical Exam:    VS:  BP (!) 146/82   Pulse 60   Ht 5\' 9"  (1.753 m)   Wt 171 lb 3.2 oz (77.7 kg)   SpO2 96%   BMI 25.28 kg/m     Wt Readings from Last 3 Encounters:  12/11/19 171 lb 3.2 oz (77.7 kg)  09/11/19 169 lb 3.2 oz (76.7 kg)  08/08/19 168 lb (76.2 kg)     GEN: Well nourished, well developed in no acute distress HEENT: Normal NECK: No JVD; No carotid bruits LYMPHATICS: No lymphadenopathy CARDIAC: S1S2 noted,RRR, no murmurs, rubs, gallops RESPIRATORY:  Clear to auscultation without rales, wheezing or rhonchi  ABDOMEN: Soft, non-tender, non-distended, +bowel sounds, no guarding. EXTREMITIES: No edema, No cyanosis, no clubbing MUSCULOSKELETAL:  No deformity  SKIN: Warm and dry NEUROLOGIC:  Alert and oriented x 3, non-focal PSYCHIATRIC:  Normal affect, good insight  ASSESSMENT:    1. Essential hypertension   2. Coronary artery disease involving native coronary artery of native heart without angina pectoris   3. Mixed hyperlipidemia    PLAN:     His blood pressure slightly elevated today in the office I am going to increase his losartan from 50 mg daily to 50 mg in the morning and 25 mg in the evening.  I will continue the rest of his aspirin atorvastatin and Zetia.  He also will stay on carvedilol 6.25 mg twice daily.  We asked the patient take his blood pressure daily and send this information to my office. Blood work today to assess kidney function.  The patient is in agreement with the above plan. The patient left the office in stable condition.  The patient will follow up in 6 months or sooner if needed.   Medication Adjustments/Labs and Tests Ordered: Current medicines are reviewed at length with the patient today.  Concerns regarding medicines are outlined above.  Orders Placed This Encounter  Procedures  . Basic metabolic panel  . Magnesium   Meds ordered this encounter  Medications  . losartan (COZAAR) 50 MG tablet    Sig: Take one tablet by  mouth daily in the morning. Then take 1/2 tablet by mouth daily in the evening.    Dispense:  135 tablet    Refill:  3  . ezetimibe (ZETIA) 10 MG tablet    Sig: Take 1 tablet (10 mg total) by mouth daily.    Dispense:  90 tablet    Refill:  3  . atorvastatin (LIPITOR) 40 MG tablet    Sig: Take 1 tablet (40 mg total) by mouth daily at 6 PM.    Dispense:  90 tablet    Refill:  3  . carvedilol (COREG) 6.25 MG tablet    Sig: Take 1 tablet (6.25 mg total) by mouth 2 (two) times daily.    Dispense:  180 tablet    Refill:  3    Patient Instructions  Medication Instructions:  Your physician has recommended you make the following change in your medication:  INCREASE: Losartan 50 mg take one tablet by mouth daily in the morning. Then take 1/2 tablet by mouth daily in the evening.  STOP: Aspirin 325 mg START: Aspirin 81 mg take one tablet by mouth daily.  *If you need a refill on your cardiac medications before your next appointment, please call your pharmacy*   Lab Work: Your physician recommends that you return for lab work in: TODAY BMP, Mag If you have labs (blood work) drawn today and your tests are completely normal, you will receive your results only by: Marland Kitchen MyChart Message (if you have MyChart) OR . A paper copy in the mail If you have any lab test that is abnormal or we need to change your treatment, we will call you to review the results.   Testing/Procedures: None   Follow-Up: At [facility], you and your health needs are our priority.  As part of our continuing mission to provide you with exceptional heart care, we have created designated Provider Care Teams.  These Care Teams include your primary Cardiologist (physician) and Advanced Practice Providers (APPs -  Physician Assistants and Nurse Practitioners) who all work together to provide you with the care you need, when you need it.  We recommend signing up for the patient portal called "MyChart".  Sign up information is  provided on this After Visit Summary.  MyChart is used to connect with patients for Virtual Visits (Telemedicine).  Patients are able to view lab/test results, encounter notes, upcoming appointments, etc.  Non-urgent messages can be sent to your provider as well.   To learn more about what you can do with MyChart, go to [email or web address].    Your next appointment:   6 month(s)   The format for your next appointment:   In Person  Provider:   Thomasene Ripple, DO   Other Instructions      Adopting a Healthy Lifestyle.  Know what a healthy weight is for you (roughly BMI <25) and aim to maintain this   Aim for 7+ servings of fruits and vegetables daily   65-80+ fluid ounces of water or unsweet tea for healthy kidneys   Limit to max 1 drink of alcohol per day; avoid smoking/tobacco   Limit animal fats in diet for cholesterol and heart health - choose grass fed whenever available   Avoid highly processed foods, and foods high in saturated/trans fats   Aim for low stress - take time to unwind and care for your mental health   Aim for 150 min of moderate intensity exercise weekly for heart health, and weights twice weekly for bone health   Aim for 7-9 hours of sleep daily   When it comes to diets, agreement about the perfect plan isnt easy to find, even among the experts. Experts at the [employer] [facility] [employer] developed an idea known as the Healthy Eating Plate. Just imagine a plate divided  into logical, healthy portions.   The emphasis is on diet quality:   Load up on vegetables and fruits - one-half of your plate: Aim for color and variety, and remember that potatoes dont count.   Go for whole grains - one-quarter of your plate: Whole wheat, barley, wheat berries, quinoa, oats, brown rice, and foods made with them. If you want pasta, go with whole wheat pasta.   Protein power - one-quarter of your plate: Fish, chicken, beans, and nuts are all healthy, versatile  protein sources. Limit red meat.   The diet, however, does go beyond the plate, offering a few other suggestions.   Use healthy plant oils, such as olive, canola, soy, corn, sunflower and peanut. Check the labels, and avoid partially hydrogenated oil, which have unhealthy trans fats.   If youre thirsty, drink water. Coffee and tea are good in moderation, but skip sugary drinks and limit milk and dairy products to one or two daily servings.   The type of carbohydrate in the diet is more important than the amount. Some sources of carbohydrates, such as vegetables, fruits, whole grains, and beans-are healthier than others.   Finally, stay active  Signed, Thomasene Ripple, DO  12/11/2019 10:29 AM    [facility]

## 2019-12-12 ENCOUNTER — Telehealth: Payer: Self-pay

## 2019-12-12 NOTE — Telephone Encounter (Signed)
Spoke with patient regarding results and recommendation.  Patient verbalizes understanding and is agreeable to plan of care. Advised patient to call back with any issues or concerns.  

## 2019-12-12 NOTE — Telephone Encounter (Signed)
-----   Message from Thomasene Ripple, DO sent at 12/12/2019 11:20 AM EST ----- Labs normal

## 2019-12-17 ENCOUNTER — Telehealth: Payer: Self-pay | Admitting: Cardiology

## 2019-12-17 NOTE — Telephone Encounter (Signed)
Please advise:  Spoke with patient, he states since Dr. Servando Salina increased his medications on December 1st his blood pressure has been a little elevated.   He called today stating his BP at 6:30am was 170/101 (same time as he took his medications), when he went home at 10:50am he rechecked his blood pressure and it was 150/90, he checked it not long before I called and he states it was the same as 10:50am.  He states no blurred vision, or headache. He does state he is having some dizziness when he stands up and feels "swimmy headed".   Yesterday he checked his blood pressure at 6:48am and it was 164/91.   I confirmed that he is taking his losartan (1 tablet in am and 1/2 tablet in pm) and carvedilol (twice daily).

## 2019-12-17 NOTE — Telephone Encounter (Signed)
That can be addressed tomorrow by his doctor

## 2019-12-17 NOTE — Telephone Encounter (Signed)
Pt c/o BP issue: STAT if pt c/o blurred vision, one-sided weakness or slurred speech  1. What are your last 5 BP readings? 170/101 this morning 6:30 am, 150/90 when came home 10:50 am  2. Are you having any other symptoms (ex. Dizziness, headache, blurred vision, passed out)? Swimmy headed  3. What is your BP issue? Patient states his BP has been high today and he has been feeling "swimmy headed". He states his BP is usually around 148/94.

## 2019-12-18 NOTE — Telephone Encounter (Signed)
Please check on the patient - ask if dizziness is resolved.   I would like to increase the losartan to 50mg  twice daily.   I would like to see him in 2 weeks.

## 2019-12-18 NOTE — Telephone Encounter (Signed)
Spoke with patient, he states the dizziness is better today then yesterday but his Blood Pressure is still high, he verbally understood to taking Losartan twice daily and he is going to keep track of his pressures till next visit on 12/23 at 1:20p.

## 2019-12-31 ENCOUNTER — Telehealth: Payer: Self-pay | Admitting: Cardiology

## 2019-12-31 NOTE — Telephone Encounter (Signed)
Recommendation of Dr. Servando Salina given to patient. Pt verbalized understanding and no additional questions.

## 2019-12-31 NOTE — Telephone Encounter (Signed)
Pt called in and stated he is suppose to get the covid booster tomorrow and would to know if there is any concerns before he goes tot get this since he had open heart surgery   Best number [phone]

## 2019-12-31 NOTE — Telephone Encounter (Signed)
He should be able to get his vaccine.  There is no contraindication from a cardiovascular standpoint.

## 2020-01-15 ENCOUNTER — Ambulatory Visit: Payer: PPO | Admitting: Cardiology

## 2020-04-02 DIAGNOSIS — M1711 Unilateral primary osteoarthritis, right knee: Secondary | ICD-10-CM | POA: Diagnosis not present

## 2020-05-14 DIAGNOSIS — M19011 Primary osteoarthritis, right shoulder: Secondary | ICD-10-CM | POA: Diagnosis not present

## 2020-07-07 ENCOUNTER — Encounter: Payer: Self-pay | Admitting: Cardiology

## 2020-07-07 ENCOUNTER — Other Ambulatory Visit: Payer: Self-pay

## 2020-07-07 ENCOUNTER — Ambulatory Visit (INDEPENDENT_AMBULATORY_CARE_PROVIDER_SITE_OTHER): Payer: Medicare Other | Admitting: Cardiology

## 2020-07-07 VITALS — BP 160/100 | HR 62 | Ht 69.0 in | Wt 173.0 lb

## 2020-07-07 DIAGNOSIS — I2510 Atherosclerotic heart disease of native coronary artery without angina pectoris: Secondary | ICD-10-CM

## 2020-07-07 DIAGNOSIS — E663 Overweight: Secondary | ICD-10-CM

## 2020-07-07 DIAGNOSIS — I10 Essential (primary) hypertension: Secondary | ICD-10-CM | POA: Diagnosis not present

## 2020-07-07 DIAGNOSIS — E782 Mixed hyperlipidemia: Secondary | ICD-10-CM

## 2020-07-07 MED ORDER — HYDROCHLOROTHIAZIDE 12.5 MG PO CAPS
12.5000 mg | ORAL_CAPSULE | Freq: Every day | ORAL | 3 refills | Status: DC
Start: 2020-07-07 — End: 2020-08-07

## 2020-07-07 NOTE — Progress Notes (Signed)
Cardiology Office Note:    Date:  07/07/2020   ID:  Juan Payne, DOB 1955/07/14, MRN [id number]  PCP:  Street, Stephanie Coup, MD  Cardiologist:  Thomasene Ripple, DO  Electrophysiologist:  None   Referring MD: Street, Stephanie Coup, *   No chief complaint on file. I am doing fine  History of Present Illness:    Juan Payne is a 65 y.o. male with a hx of coronary artery disease status post CABG with LIMA to LAD, left radial artery to OM2 on May 14, 2019, hyperlipidemia, hypertension, postop A. fib currently on amiodarone presents today for his post operative visit.   I did see the patient on May 07, 2019 at that time he was experiencing significant symptoms therefore I recommended he go to [facility] ED.  He was admitted to the hospital and underwent a nuclear stress test which was reported to be positive.  The patient was then transferred to [facility] where he underwent left heart catheterization showing multivessel disease.  Was recommended that he undergo coronary artery bypass grafting.  He successfully completed this surgery on May 14, 2019.  Postop stay was complicated by paroxysmal atrial fibrillation he was placed on amiodarone.    I did see the patient on Jun 04, 2019 at that time he was postop doing well, he was doing cardiac rehab he offered no complaints at that time no changes were made in his medications.    I saw the patient on August 08, 2019 at that time he was dizzy, had several stopped combination pill hydrochlorothiazide- losartan.  And only resume losartan at 50 mg daily.     I saw the patient on December 11, 2019 at that time he appeared to be doing well from a cardiovascular standpoint no changes were made.  He is here today for follow-up visit.  He offers no complaints.  He denies any chest pain shortness of breath.   Past Medical History:  Diagnosis Date   Chest pain 05/07/2019   Coronary artery disease 05/14/2019   Coronary artery disease  involving native heart without angina pectoris    Essential hypertension 05/07/2019   Hyperlipidemia    Near syncope 05/11/2019   PAF (paroxysmal atrial fibrillation) (HCC)    Refusal of blood transfusions as patient is Jehovah's Witness 05/11/2019   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   LIMA to LAD LEFT RADIAL ARTERY to OM2    Unstable angina (HCC) 05/11/2019    Past Surgical History:  Procedure Laterality Date   CORONARY ARTERY BYPASS GRAFT N/A 05/14/2019   Procedure: CORONARY ARTERY BYPASS GRAFTING (CABG) x TWO USING LEFT INTERNAL MAMMARY ARTERY AND LEFT RADIAL ARTERY;  Surgeon: Kerin Perna, MD;  Location[facility] OR;  Service: Open Heart Surgery;  Laterality: N/A;   LEFT HEART CATH AND CORONARY ANGIOGRAPHY N/A 05/13/2019   Procedure: LEFT HEART CATH AND CORONARY ANGIOGRAPHY;  Surgeon: Runell Gess, MD;  Location: [facility] INVASIVE CV LAB;  Service: Cardiovascular;  Laterality: N/A;   RADIAL ARTERY HARVEST Left 05/14/2019   Procedure: RADIAL ARTERY HARVEST;  Surgeon: Kerin Perna, MD;  Location: [facility] OR;  Service: Open Heart Surgery;  Laterality: Left;   TEE WITHOUT CARDIOVERSION N/A 05/14/2019   Procedure: TRANSESOPHAGEAL ECHOCARDIOGRAM (TEE);  Surgeon: Donata Clay, Theron Arista, MD;  Location: [facility] OR;  Service: Open Heart Surgery;  Laterality: N/A;    Current Medications: Current Meds  Medication Sig   aspirin EC 81 MG tablet Take 81 mg by mouth daily.  Swallow whole.   atorvastatin (LIPITOR) 40 MG tablet Take 1 tablet (40 mg total) by mouth daily at 6 PM.   carvedilol (COREG) 6.25 MG tablet Take 1 tablet (6.25 mg total) by mouth 2 (two) times daily.   citalopram (CELEXA) 20 MG tablet Take 20 mg by mouth at bedtime.    hydrochlorothiazide (MICROZIDE) 12.5 MG capsule Take 1 capsule (12.5 mg total) by mouth daily.   hydrOXYzine (ATARAX/VISTARIL) 25 MG tablet Take 25 mg by mouth 3 (three) times daily.   losartan (COZAAR) 50 MG tablet Take one tablet by mouth daily in the morning. Then take 1/2 tablet by mouth daily in  the evening. (Patient taking differently: Take one tablet by mouth daily in the morning. Then take 1 tablet by mouth daily in the evening.)     Allergies:   Morphine and related   Social History   Socioeconomic History   Marital status: Married    Spouse name: Not on file   Number of children: Not on file   Years of education: Not on file   Highest education level: Not on file  Occupational History   Not on file  Tobacco Use   Smoking status: Never   Smokeless tobacco: Never  Substance and Sexual Activity   Alcohol use: Not Currently   Drug use: Never   Sexual activity: Not on file  Other Topics Concern   Not on file  Social History Narrative   Not on file   Social Determinants of Health   Financial Resource Strain: Not on file  Food Insecurity: Not on file  Transportation Needs: Not on file  Physical Activity: Not on file  Stress: Not on file  Social Connections: Not on file     Family History: The patient's family history includes Heart attack in his brother and father; Hypertension in his mother.  ROS:   Review of Systems  Constitution: Negative for decreased appetite, fever and weight gain.  HENT: Negative for congestion, ear discharge, hoarse voice and sore throat.   Eyes: Negative for discharge, redness, vision loss in right eye and visual halos.  Cardiovascular: Negative for chest pain, dyspnea on exertion, leg swelling, orthopnea and palpitations.  Respiratory: Negative for cough, hemoptysis, shortness of breath and snoring.   Endocrine: Negative for heat intolerance and polyphagia.  Hematologic/Lymphatic: Negative for bleeding problem. Does not bruise/bleed easily.  Skin: Negative for flushing, nail changes, rash and suspicious lesions.  Musculoskeletal: Negative for arthritis, joint pain, muscle cramps, myalgias, neck pain and stiffness.  Gastrointestinal: Negative for abdominal pain, bowel incontinence, diarrhea and excessive appetite.  Genitourinary:  Negative for decreased libido, genital sores and incomplete emptying.  Neurological: Negative for brief paralysis, focal weakness, headaches and loss of balance.  Psychiatric/Behavioral: Negative for altered mental status, depression and suicidal ideas.  Allergic/Immunologic: Negative for HIV exposure and persistent infections.    EKGs/Labs/Other Studies Reviewed:    The following studies were reviewed today:   EKG: None today  Transthoracic echocardiogram May 13, 2019 1. Left ventricular ejection fraction, by estimation, is 45 to 50%. The  left ventricle has mildly decreased function. The left ventricle  demonstrates regional wall motion abnormalities (see scoring  diagram/findings for description). Left ventricular  diastolic parameters are consistent with Grade I diastolic dysfunction  (impaired relaxation).   2. Right ventricular systolic function is normal. The right ventricular  size is normal.   3. The mitral valve is normal in structure. Mild mitral valve  regurgitation. No evidence of mitral stenosis.  4. The aortic valve is tricuspid. Aortic valve regurgitation is not  visualized. No aortic stenosis is present.   5. Aortic dilatation noted. There is mild dilatation of the ascending  aorta measuring 38 mm.   6. The inferior vena cava is normal in size with greater than 50%  respiratory variability, suggesting right atrial pressure of 3 mmHg.   FINDINGS   Left Ventricle: Left ventricular ejection fraction, by estimation, is 45  to 50%. The left ventricle has mildly decreased function. The left  ventricle demonstrates regional wall motion abnormalities. The left  ventricular internal cavity size was normal  in size. There is no left ventricular hypertrophy. Left ventricular  diastolic parameters are consistent with Grade I diastolic dysfunction  (impaired relaxation).      LV Wall Scoring:  The posterior wall is akinetic.   Right Ventricle: The right ventricular  size is normal. No increase in  right ventricular wall thickness. Right ventricular systolic function is  normal.   Left Atrium: Left atrial size was normal in size.   Right Atrium: Right atrial size was normal in size.   Pericardium: There is no evidence of pericardial effusion.   Mitral Valve: The mitral valve is normal in structure. Normal mobility of  the mitral valve leaflets. Mild mitral valve regurgitation. No evidence of  mitral valve stenosis.   Tricuspid Valve: The tricuspid valve is normal in structure. Tricuspid  valve regurgitation is not demonstrated. No evidence of tricuspid  stenosis.   Aortic Valve: The aortic valve is tricuspid. . There is mild thickening  and mild calcification of the aortic valve. Aortic valve regurgitation is  not visualized. No aortic stenosis is present. There is mild thickening of  the aortic valve. There is mild  calcification of the aortic valve.   Pulmonic Valve: The pulmonic valve was normal in structure. Pulmonic valve  regurgitation is not visualized. No evidence of pulmonic stenosis.   Aorta: Aortic dilatation noted. There is mild dilatation of the ascending  aorta measuring 38 mm.   Venous: The inferior vena cava is normal in size with greater than 50%  respiratory variability, suggesting right atrial pressure of 3 mmHg.   IAS/Shunts: No atrial level shunt detected by color flow Doppler.       Recent Labs: 12/11/2019: BUN 10; Creatinine, Ser 0.90; Magnesium 2.0; Potassium 4.7; Sodium 140  Recent Lipid Panel    Component Value Date/Time   CHOL 147 09/12/2019 0911   TRIG 71 09/12/2019 0911   HDL 48 09/12/2019 0911   CHOLHDL 3.1 09/12/2019 0911   CHOLHDL 3.7 05/12/2019 0216   VLDL 27 05/12/2019 0216   LDLCALC 85 09/12/2019 0911    Physical Exam:    VS:  BP (!) 160/100   Pulse 62   Ht 5\' 9"  (1.753 m)   Wt 173 lb (78.5 kg)   SpO2 97%   BMI 25.55 kg/m     Wt Readings from Last 3 Encounters:  07/07/20 173 lb (78.5  kg)  12/11/19 171 lb 3.2 oz (77.7 kg)  09/11/19 169 lb 3.2 oz (76.7 kg)     GEN: Well nourished, well developed in no acute distress HEENT: Normal NECK: No JVD; No carotid bruits LYMPHATICS: No lymphadenopathy CARDIAC: S1S2 noted,RRR, no murmurs, rubs, gallops RESPIRATORY:  Clear to auscultation without rales, wheezing or rhonchi  ABDOMEN: Soft, non-tender, non-distended, +bowel sounds, no guarding. EXTREMITIES: No edema, No cyanosis, no clubbing MUSCULOSKELETAL:  No deformity  SKIN: Warm and dry NEUROLOGIC:  Alert and oriented  x 3, non-focal PSYCHIATRIC:  Normal affect, good insight  ASSESSMENT:    1. Coronary artery disease involving native coronary artery of native heart without angina pectoris   2. Hypertension, unspecified type   3. Mixed hyperlipidemia   4. Overweight (BMI 25.0-29.9)    PLAN:     His blood pressure is elevated in the office today.  Manually performed by me.  He would like to restart his hydrochlorothiazide 12.5 mg daily he will continue on the losartan 50 mg twice daily as well as his carvedilol 6.25 mg twice daily.  If this low-dose of diuretic does not help if heart rate can permit we will increase his carvedilol.  No angina symptoms continue patient on his aspirin 81 mg daily as well as his atorvastatin 40 mg daily.  Will get lipid profile today.   The patient is in agreement with the above plan. The patient left the office in stable condition.  The patient will follow up in 8 weeks-virtually   Medication Adjustments/Labs and Tests Ordered: Current medicines are reviewed at length with the patient today.  Concerns regarding medicines are outlined above.  Orders Placed This Encounter  Procedures   Basic metabolic panel   Magnesium   Lipid panel   Meds ordered this encounter  Medications   hydrochlorothiazide (MICROZIDE) 12.5 MG capsule    Sig: Take 1 capsule (12.5 mg total) by mouth daily.    Dispense:  90 capsule    Refill:  3    Patient  Instructions  Medication Instructions:   Your physician has recommended you make the following change in your medication:  START: Hydrochlorothiazide 12.5 mg once daily  *If you need a refill on your cardiac medications before your next appointment, please call your pharmacy*   Lab Work: Your physician recommends that you return for lab work in:  LATER DATE: BMET, Mag, Lipids -   come fasting  If you have labs (blood work) drawn today and your tests are completely normal, you will receive your results only by: MyChart Message (if you have MyChart) OR A paper copy in the mail If you have any lab test that is abnormal or we need to change your treatment, we will call you to review the results.   Testing/Procedures:  None   Follow-Up: At [facility], you and your health needs are our priority.  As part of our continuing mission to provide you with exceptional heart care, we have created designated Provider Care Teams.  These Care Teams include your primary Cardiologist (physician) and Advanced Practice Providers (APPs -  Physician Assistants and Nurse Practitioners) who all work together to provide you with the care you need, when you need it.  We recommend signing up for the patient portal called "MyChart".  Sign up information is provided on this After Visit Summary.  MyChart is used to connect with patients for Virtual Visits (Telemedicine).  Patients are able to view lab/test results, encounter notes, upcoming appointments, etc.  Non-urgent messages can be sent to your provider as well.   To learn more about what you can do with MyChart, go to [email or web address].    Your next appointment:   8 week(s)  The format for your next appointment:   Virtual Visit   Provider:   Thomasene Ripple, DO   Other Instructions    Adopting a Healthy Lifestyle.  Know what a healthy weight is for you (roughly BMI <25) and aim to maintain this   Aim for 7+ servings of  fruits and  vegetables daily   65-80+ fluid ounces of water or unsweet tea for healthy kidneys   Limit to max 1 drink of alcohol per day; avoid smoking/tobacco   Limit animal fats in diet for cholesterol and heart health - choose grass fed whenever available   Avoid highly processed foods, and foods high in saturated/trans fats   Aim for low stress - take time to unwind and care for your mental health   Aim for 150 min of moderate intensity exercise weekly for heart health, and weights twice weekly for bone health   Aim for 7-9 hours of sleep daily   When it comes to diets, agreement about the perfect plan isnt easy to find, even among the experts. Experts at the [employer] developed an idea known as the Healthy Eating Plate. Just imagine a plate divided into logical, healthy portions.   The emphasis is on diet quality:   Load up on vegetables and fruits - one-half of your plate: Aim for color and variety, and remember that potatoes dont count.   Go for whole grains - one-quarter of your plate: Whole wheat, barley, wheat berries, quinoa, oats, brown rice, and foods made with them. If you want pasta, go with whole wheat pasta.   Protein power - one-quarter of your plate: Fish, chicken, beans, and nuts are all healthy, versatile protein sources. Limit red meat.   The diet, however, does go beyond the plate, offering a few other suggestions.   Use healthy plant oils, such as olive, canola, soy, corn, sunflower and peanut. Check the labels, and avoid partially hydrogenated oil, which have unhealthy trans fats.   If youre thirsty, drink water. Coffee and tea are good in moderation, but skip sugary drinks and limit milk and dairy products to one or two daily servings.   The type of carbohydrate in the diet is more important than the amount. Some sources of carbohydrates, such as vegetables, fruits, whole grains, and beans-are healthier than others.   Finally, stay  active  Signed, Thomasene RippleKardie Paddy Neis, DO  07/07/2020 10:44 AM    [facility]

## 2020-07-07 NOTE — Patient Instructions (Addendum)
Medication Instructions:   Your physician has recommended you make the following change in your medication:  START: Hydrochlorothiazide 12.5 mg once daily  *If you need a refill on your cardiac medications before your next appointment, please call your pharmacy*   Lab Work: Your physician recommends that you return for lab work in:  LATER DATE: BMET, Mag, Lipids -   come fasting  If you have labs (blood work) drawn today and your tests are completely normal, you will receive your results only by: MyChart Message (if you have MyChart) OR A paper copy in the mail If you have any lab test that is abnormal or we need to change your treatment, we will call you to review the results.   Testing/Procedures:  None   Follow-Up: At [facility], you and your health needs are our priority.  As part of our continuing mission to provide you with exceptional heart care, we have created designated Provider Care Teams.  These Care Teams include your primary Cardiologist (physician) and Advanced Practice Providers (APPs -  Physician Assistants and Nurse Practitioners) who all work together to provide you with the care you need, when you need it.  We recommend signing up for the patient portal called "MyChart".  Sign up information is provided on this After Visit Summary.  MyChart is used to connect with patients for Virtual Visits (Telemedicine).  Patients are able to view lab/test results, encounter notes, upcoming appointments, etc.  Non-urgent messages can be sent to your provider as well.   To learn more about what you can do with MyChart, go to [email or web address].    Your next appointment:   8 week(s)  The format for your next appointment:   Virtual Visit   Provider:   Thomasene Ripple, DO   Other Instructions

## 2020-07-10 DIAGNOSIS — I10 Essential (primary) hypertension: Secondary | ICD-10-CM | POA: Diagnosis not present

## 2020-07-10 DIAGNOSIS — I2510 Atherosclerotic heart disease of native coronary artery without angina pectoris: Secondary | ICD-10-CM | POA: Diagnosis not present

## 2020-07-10 LAB — LIPID PANEL
Chol/HDL Ratio: 2.2 ratio (ref 0.0–5.0)
Cholesterol, Total: 145 mg/dL (ref 100–199)
HDL: 65 mg/dL (ref >39)
LDL Chol Calc (NIH): 66 mg/dL (ref 0–99)
Triglycerides: 69 mg/dL (ref 0–149)
VLDL Cholesterol Cal: 14 mg/dL (ref 5–40)

## 2020-07-10 LAB — BASIC METABOLIC PANEL
BUN/Creatinine Ratio: 15 (ref 10–24)
BUN: 14 mg/dL (ref 8–27)
CO2: 25 mmol/L (ref 20–29)
Calcium: 9.9 mg/dL (ref 8.6–10.2)
Chloride: 98 mmol/L (ref 96–106)
Creatinine, Ser: 0.95 mg/dL (ref 0.76–1.27)
Glucose: 92 mg/dL (ref 65–99)
Potassium: 4.6 mmol/L (ref 3.5–5.2)
Sodium: 138 mmol/L (ref 134–144)
eGFR: 89 mL/min/{1.73_m2} (ref >59)

## 2020-07-10 LAB — MAGNESIUM: Magnesium: 2 mg/dL (ref 1.6–2.3)

## 2020-08-05 ENCOUNTER — Other Ambulatory Visit: Payer: Self-pay

## 2020-08-05 ENCOUNTER — Emergency Department (HOSPITAL_COMMUNITY): Payer: Medicare Other

## 2020-08-05 ENCOUNTER — Emergency Department (HOSPITAL_COMMUNITY)
Admission: EM | Admit: 2020-08-05 | Discharge: 2020-08-05 | Disposition: A | Discharge status: Home / Self Care | Payer: Medicare Other | Attending: Emergency Medicine | Admitting: Emergency Medicine

## 2020-08-05 ENCOUNTER — Encounter (HOSPITAL_COMMUNITY): Payer: Self-pay | Admitting: Emergency Medicine

## 2020-08-05 DIAGNOSIS — I639 Cerebral infarction, unspecified: Secondary | ICD-10-CM | POA: Insufficient documentation

## 2020-08-05 DIAGNOSIS — Z7982 Long term (current) use of aspirin: Secondary | ICD-10-CM | POA: Insufficient documentation

## 2020-08-05 DIAGNOSIS — I2510 Atherosclerotic heart disease of native coronary artery without angina pectoris: Secondary | ICD-10-CM | POA: Insufficient documentation

## 2020-08-05 DIAGNOSIS — G454 Transient global amnesia: Secondary | ICD-10-CM

## 2020-08-05 DIAGNOSIS — Z79899 Other long term (current) drug therapy: Secondary | ICD-10-CM | POA: Insufficient documentation

## 2020-08-05 DIAGNOSIS — Z20822 Contact with and (suspected) exposure to covid-19: Secondary | ICD-10-CM | POA: Insufficient documentation

## 2020-08-05 DIAGNOSIS — Z951 Presence of aortocoronary bypass graft: Secondary | ICD-10-CM | POA: Insufficient documentation

## 2020-08-05 DIAGNOSIS — I10 Essential (primary) hypertension: Secondary | ICD-10-CM | POA: Insufficient documentation

## 2020-08-05 DIAGNOSIS — Y900 Blood alcohol level of less than 20 mg/100 ml: Secondary | ICD-10-CM | POA: Insufficient documentation

## 2020-08-05 DIAGNOSIS — R410 Disorientation, unspecified: Secondary | ICD-10-CM | POA: Insufficient documentation

## 2020-08-05 DIAGNOSIS — R4182 Altered mental status, unspecified: Secondary | ICD-10-CM | POA: Diagnosis not present

## 2020-08-05 DIAGNOSIS — R404 Transient alteration of awareness: Secondary | ICD-10-CM | POA: Diagnosis not present

## 2020-08-05 DIAGNOSIS — J3489 Other specified disorders of nose and nasal sinuses: Secondary | ICD-10-CM | POA: Diagnosis not present

## 2020-08-05 DIAGNOSIS — R29818 Other symptoms and signs involving the nervous system: Secondary | ICD-10-CM | POA: Diagnosis not present

## 2020-08-05 DIAGNOSIS — I491 Atrial premature depolarization: Secondary | ICD-10-CM | POA: Diagnosis not present

## 2020-08-05 DIAGNOSIS — R42 Dizziness and giddiness: Secondary | ICD-10-CM | POA: Diagnosis not present

## 2020-08-05 LAB — COMPREHENSIVE METABOLIC PANEL
ALT: 31 U/L (ref 0–44)
AST: 28 U/L (ref 15–41)
Albumin: 4 g/dL (ref 3.5–5.0)
Alkaline Phosphatase: 45 U/L (ref 38–126)
Anion gap: 8 (ref 5–15)
BUN: 15 mg/dL (ref 8–23)
CO2: 27 mmol/L (ref 22–32)
Calcium: 9.1 mg/dL (ref 8.9–10.3)
Chloride: 102 mmol/L (ref 98–111)
Creatinine, Ser: 0.92 mg/dL (ref 0.61–1.24)
GFR, Estimated: >60 mL/min (ref >60)
Glucose, Bld: 112 mg/dL — ABNORMAL HIGH (ref 70–99)
Potassium: 3.7 mmol/L (ref 3.5–5.1)
Sodium: 137 mmol/L (ref 135–145)
Total Bilirubin: 0.9 mg/dL (ref 0.3–1.2)
Total Protein: 6.9 g/dL (ref 6.5–8.1)

## 2020-08-05 LAB — PROTIME-INR
INR: 1 (ref 0.8–1.2)
Prothrombin Time: 13.3 seconds (ref 11.4–15.2)

## 2020-08-05 LAB — ETHANOL: Alcohol, Ethyl (B): <10 mg/dL (ref <10)

## 2020-08-05 LAB — I-STAT CHEM 8, ED
BUN: 27 mg/dL — ABNORMAL HIGH (ref 8–23)
Calcium, Ion: 1.09 mmol/L — ABNORMAL LOW (ref 1.15–1.40)
Chloride: 101 mmol/L (ref 98–111)
Creatinine, Ser: 0.9 mg/dL (ref 0.61–1.24)
Glucose, Bld: 109 mg/dL — ABNORMAL HIGH (ref 70–99)
HCT: 44 % (ref 39.0–52.0)
Hemoglobin: 15 g/dL (ref 13.0–17.0)
Potassium: 5.5 mmol/L — ABNORMAL HIGH (ref 3.5–5.1)
Sodium: 137 mmol/L (ref 135–145)
TCO2: 29 mmol/L (ref 22–32)

## 2020-08-05 LAB — CBC
HCT: 43.5 % (ref 39.0–52.0)
Hemoglobin: 15 g/dL (ref 13.0–17.0)
MCH: 32.7 pg (ref 26.0–34.0)
MCHC: 34.5 g/dL (ref 30.0–36.0)
MCV: 94.8 fL (ref 80.0–100.0)
Platelets: 248 10*3/uL (ref 150–400)
RBC: 4.59 MIL/uL (ref 4.22–5.81)
RDW: 11.9 % (ref 11.5–15.5)
WBC: 10.4 10*3/uL (ref 4.0–10.5)
nRBC: 0 % (ref 0.0–0.2)

## 2020-08-05 LAB — RESP PANEL BY RT-PCR (FLU A&B, COVID) ARPGX2
Influenza A by PCR: NEGATIVE
Influenza B by PCR: NEGATIVE
SARS Coronavirus 2 by RT PCR: NEGATIVE

## 2020-08-05 LAB — RAPID URINE DRUG SCREEN, HOSP PERFORMED
Amphetamines: NOT DETECTED
Barbiturates: NOT DETECTED
Benzodiazepines: NOT DETECTED
Cocaine: NOT DETECTED
Opiates: NOT DETECTED
Tetrahydrocannabinol: NOT DETECTED

## 2020-08-05 LAB — DIFFERENTIAL
Abs Immature Granulocytes: 0.03 10*3/uL (ref 0.00–0.07)
Basophils Absolute: 0.1 10*3/uL (ref 0.0–0.1)
Basophils Relative: 1 %
Eosinophils Absolute: 0.3 10*3/uL (ref 0.0–0.5)
Eosinophils Relative: 3 %
Immature Granulocytes: 0 %
Lymphocytes Relative: 18 %
Lymphs Abs: 1.8 10*3/uL (ref 0.7–4.0)
Monocytes Absolute: 0.9 10*3/uL (ref 0.1–1.0)
Monocytes Relative: 8 %
Neutro Abs: 7.4 10*3/uL (ref 1.7–7.7)
Neutrophils Relative %: 70 %

## 2020-08-05 LAB — URINALYSIS, ROUTINE W REFLEX MICROSCOPIC
Bilirubin Urine: NEGATIVE
Glucose, UA: NEGATIVE mg/dL
Hgb urine dipstick: NEGATIVE
Ketones, ur: NEGATIVE mg/dL
Leukocytes,Ua: NEGATIVE
Nitrite: NEGATIVE
Protein, ur: NEGATIVE mg/dL
Specific Gravity, Urine: 1.013 (ref 1.005–1.030)
pH: 5 (ref 5.0–8.0)

## 2020-08-05 LAB — APTT: aPTT: 26 seconds (ref 24–36)

## 2020-08-05 NOTE — Progress Notes (Signed)
EEG complete - results pending 

## 2020-08-05 NOTE — Procedures (Signed)
Patient Name: Juan Payne  MRN: [id number]  Epilepsy Attending: Charlsie Quest  Referring Physician/Provider: Dr Arby Barrette Date: 08/05/2020 Duration: 22.51 mins  Patient history: 65yo M with ams. EEG to evaluate for seizure  Level of alertness: Awake  AEDs during EEG study: None  Technical aspects: This EEG study was done with scalp electrodes positioned according to the 10-20 International system of electrode placement. Electrical activity was acquired at a sampling rate of 500Hz  and reviewed with a high frequency filter of 70Hz  and a low frequency filter of 1Hz . EEG data were recorded continuously and digitally stored.   Description: The posterior dominant rhythm consists of 9-10 Hz activity of moderate voltage (25-35 uV) seen predominantly in posterior head regions, symmetric and reactive to eye opening and eye closing. Hyperventilation and photic stimulation were not performed.     IMPRESSION: This study is within normal limits. No seizures or epileptiform discharges were seen throughout the recording.  Maggie Dworkin 

## 2020-08-05 NOTE — ED Triage Notes (Signed)
Per [facility] pt coming from home where his wife called out for possible stroke. States starting around 9:15 am having repetitive questioning and not remembering. EMS reports unable to palpate pulse to left radial or pedal.

## 2020-08-05 NOTE — Consult Note (Addendum)
NEURO HOSPITALIST CONSULT NOTE   Requesting physician: Dr. Donnald Garre  Reason for Consult:AMS  History obtained from:  Patient and Family   HPI:                                                                                                                                          Juan Payne is a 65 y.o. male with a PMHx of CAD s/p CABG x 2 in 2021, paroxysmal a.fib (not anticoagulated), HTN and HLD who presented to the ED for confusion.  Per report: Patient was at home this morning and at about 9:15 AM he became confused. He kept repeating himself asking the same questions over and over and did not remember what he was doing.  Patient remembers waking up about 0600 this morning at his usual time. He also remembers shaving, but does not remember getting dressed. He says he "woke up" in the hospital. Denies HA, vision changes, SOB. From what he can recall this has never happened before. Brother is concerned that the patient does not remember that his wife has cancer, and is repeating things.  Hospital course: UA: unremarkable UDS: negative BMP: BUN: 27, k: 5.5 ETOH: negative CBC: unremarkable CTH: no hemorrhage   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   LIMA to LAD LEFT RADIAL ARTERY to OM2    Unstable angina (HCC) 05/11/2019    Past Surgical History:  Procedure Laterality Date   CORONARY ARTERY BYPASS GRAFT N/A 05/14/2019   Procedure: CORONARY ARTERY BYPASS GRAFTING (CABG) x TWO USING LEFT INTERNAL MAMMARY ARTERY AND LEFT RADIAL ARTERY;  Surgeon: Kerin Perna, MD;  Location: [facility] OR;  Service: Open Heart  Surgery;  Laterality: N/A;   LEFT HEART CATH AND CORONARY ANGIOGRAPHY N/A 05/13/2019   Procedure: LEFT HEART CATH AND CORONARY ANGIOGRAPHY;  Surgeon: Runell Gess, MD;  Location: [facility];  Service: Cardiovascular;  Laterality: N/A;   RADIAL ARTERY HARVEST Left 05/14/2019   Procedure: RADIAL ARTERY HARVEST;  Surgeon: Kerin Perna, MD;  Location: [facility] OR;  Service: Open Heart Surgery;  Laterality: Left;   TEE WITHOUT CARDIOVERSION N/A 05/14/2019   Procedure: TRANSESOPHAGEAL ECHOCARDIOGRAM (TEE);  Surgeon: Donata Clay, Theron Arista, MD;  Location: [facility] OR;  Service: Open Heart Surgery;  Laterality: N/A;    Family History  Problem Relation Age of Onset   Hypertension Mother    Heart attack Father  Heart attack Brother       Social History:  reports that he has never smoked. He has never used smokeless tobacco. He reports previous alcohol use. He reports that he does not use drugs.  Allergies  Allergen Reactions   Morphine And Related Other (See Comments)    Drops BP     MEDICATIONS:                                                                                                                     No current facility-administered medications on file prior to encounter.   Current Outpatient Medications on File Prior to Encounter  Medication Sig Dispense Refill   aspirin EC 81 MG tablet Take 81 mg by mouth daily. Swallow whole.     atorvastatin (LIPITOR) 40 MG tablet Take 1 tablet (40 mg total) by mouth daily at 6 PM. 90 tablet 3   carvedilol (COREG) 6.25 MG tablet Take 1 tablet (6.25 mg total) by mouth 2 (two) times daily. 180 tablet 3   citalopram (CELEXA) 20 MG tablet Take 20 mg by mouth at bedtime.      ezetimibe (ZETIA) 10 MG tablet Take 1 tablet (10 mg total) by mouth daily. 90 tablet 3   hydrochlorothiazide (MICROZIDE) 12.5 MG capsule Take 1 capsule (12.5 mg total) by mouth daily. 90 capsule 3   hydrOXYzine (ATARAX/VISTARIL) 25 MG tablet Take 25 mg by mouth 3 (three) times  daily.     losartan (COZAAR) 50 MG tablet Take one tablet by mouth daily in the morning. Then take 1/2 tablet by mouth daily in the evening. (Patient taking differently: Take one tablet by mouth daily in the morning. Then take 1 tablet by mouth daily in the evening.) 135 tablet 3      ROS:                                                                                                                                       History obtained from the patient  General ROS: negative for - chills, fatigue, fever, night sweats, weight gain or weight loss Psychological ROS: negative for - behavioral disorder, hallucinations, memory difficulties, mood swings or suicidal ideation Ophthalmic ROS: negative for - blurry vision, double vision, eye pain or loss of vision ENT ROS: negative for - epistaxis, nasal discharge, oral lesions, sore throat, tinnitus or vertigo Allergy and Immunology ROS: negative for - hives  or itchy/watery eyes Hematological and Lymphatic ROS: negative for - bleeding problems, bruising or swollen lymph nodes Endocrine ROS: negative for - galactorrhea, hair pattern changes, polydipsia/polyuria or temperature intolerance Respiratory ROS: negative for - cough, hemoptysis, shortness of breath or wheezing Cardiovascular ROS: negative for - chest pain, dyspnea on exertion, edema or irregular heartbeat Gastrointestinal ROS: negative for - abdominal pain, diarrhea, hematemesis, nausea/vomiting or stool incontinence Genito-Urinary ROS: negative for - dysuria, hematuria, incontinence or urinary frequency/urgency Musculoskeletal ROS: negative for - joint swelling or muscular weakness Neurological ROS: as noted in HPI Dermatological ROS: negative for rash and skin lesion changes   Blood pressure (!) 146/101, pulse 87, temperature 99.1 F (37.3 C), temperature source Oral, resp. rate 14, height 5\' 9"  (1.753 m), weight 77.1 kg, SpO2 96 %.   General Examination:                                                                                                        Physical Exam  Constitutional: Appears well-developed and well-nourished.  Psych: Affect appropriate to situation Eyes: Normal external eye and conjunctiva. HENT: Normocephalic, no lesions, without obvious abnormality.   Musculoskeletal-no joint tenderness, deformity or swelling Cardiovascular: Normal rate and regular rhythm.  Respiratory: Effort normal, non-labored breathing saturations WNL GI: Soft.  No distension. There is no tenderness.  Skin: WDI  Neurological Examination Mental Status: Alert, oriented, thought content appropriate. Short term memory impaired. Patient unable to name three items after a 3 minute delay, even with cues. Speech fluent without evidence of aphasia.  Able to follow commands without difficulty. 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 He repeats the same factual information multiple times to multiple providers after short delays, apparently unable to recall that he said the same thing before. He cannot recall multiple events from earlier in the day. Oriented to the city, state and year, but not the month or day of the week.  Cranial Nerves: II: Visual fields grossly normal, PERRL III,IV, VI: ptosis not present, EOMI V,VII: smile symmetric, facial light touch sensation normal bilaterally VIII: hearing normal bilaterally IX,X: uvula rises symmetrically XI: bilateral shoulder shrug XII: midline tongue extension Motor: Right : Upper extremity   5/5  Left:     Upper extremity   5/5  Lower extremity   5/5   Lower extremity   5/5 Tone and bulk:normal tone throughout; no atrophy noted Sensory: light touch intact throughout, bilaterally Deep Tendon Reflexes: 2+ and symmetric throughout Cerebellar: normal finger-to-nose, normal rapid alternating movements and normal heel-to-shin test Gait: deferred   Lab Results: Basic Metabolic Panel: Recent Labs   Lab 08/05/20 1133  NA 137  K 5.5*  CL 101  GLUCOSE 109*  BUN 27*  CREATININE 0.90    CBC: Recent Labs  Lab 08/05/20 1106 08/05/20 1133  WBC 10.4  --   NEUTROABS 7.4  --   HGB 15.0 15.0  HCT 43.5 44.0  MCV 94.8  --   PLT 248  --     Imaging: CT HEAD WO CONTRAST  Result Date: 08/05/2020 CLINICAL DATA:  Neuro deficit, acute, stroke suspected EXAM: CT HEAD WITHOUT CONTRAST TECHNIQUE: Contiguous axial images were obtained from the base of the skull through the vertex without intravenous contrast. COMPARISON:  Head CT 09/06/2019 report, images not retrievable at the time of this exam FINDINGS: Brain: No evidence of acute intracranial hemorrhage. Left frontal lobe white matter hypodensities, similar in description to prior CT report in August 2021. There are no new areas of focal hypoattenuation or loss of gray-white matter differentiation. There is no extra-axial collection. Vascular: No hyperdense vessel or unexpected calcification. Skull: Normal. Negative for fracture or focal lesion. Sinuses/Orbits: Scattered paranasal sinus mucosal thickening. Other: None. IMPRESSION: No acute intracranial abnormality. Left frontal white matter hypodensities, likely representing chronic small vessel ischemic disease. If there is concern for acute infarct, recommend MRI. Electronically Signed   By: Caprice Renshaw   On: 08/05/2020 14:10    Assessment: 65 year old male with a PMHx of CAD s/p CABG x 2 2021, paroxysmal a.fib (not anticoagulated), HTN and HLD who presented to the ED for confusion. On assessment today: patient long term memory appears mostly intact. Patient short term memory is impaired. He does repeat statements and ask the same questions. No other findings on assessment.  CTH did not show hemorrhage or any other acute abnormality.  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 I have formulated the assessment and recommendations. 65 year old male with PMHx of CAD s/p CABG x 2 2021, paroxysmal a.fib (not anticoagulated), HTN and HLD who presented to the ED for acute anterograde memory loss. Presentation most consistent with TGA. Plan is for MRI and EEG. If negative will need outpatient Neurology follow up. Will also need to follow up with PCP regarding paroxysmal atrial fibrillation and re-assessment of risks/benefits of anticoagulation. The patient is a Jehovah's witness and cannot be administered blood products.  Electronically signed: Dr. Caryl Pina 08/05/2020, 1:43 PM

## 2020-08-05 NOTE — ED Notes (Signed)
Patient transported to MRI 

## 2020-08-05 NOTE — Discharge Instructions (Addendum)
You likely have transient global amnesia from stressful events.  Continue your current medicines  Please call neurology for follow-up  Return to ER if you have worse forgetfulness, confusion, trouble speaking

## 2020-08-05 NOTE — ED Notes (Signed)
Patient verbalizes understanding of discharge instructions. Follow-up care reviewed. Opportunity for questioning and answers were provided. Armband removed by staff, pt discharged from ED ambulatory.  

## 2020-08-05 NOTE — ED Notes (Signed)
ED Provider at bedside. 

## 2020-08-05 NOTE — ED Provider Notes (Signed)
  Physical Exam  BP 139/90 (BP Location: Left Arm)   Pulse 78   Temp 99.1 F (37.3 C) (Oral)   Resp 16   Ht 5\' 9"  (1.753 m)   Wt 77.1 kg   SpO2 95%   BMI 25.10 kg/m   Physical Exam  ED Course/Procedures     Procedures  MDM  Care assumed at 3 PM from Dr. .  Patient has a transient global amnesia event after finding out about cancer diagnosis about his wife.  He could not remember what happened earlier.  Signout pending MRI brain and EEG  8:38 PM EEG and MRI brain unremarkable.  Patient knows that he is in the hospital.  Still has trouble remembering what happened earlier today.  I think he has transient global amnesia.  I discussed case with Dr. Clarice Pole.  He states that patient can follow-up outpatient with neurology      Otelia Limes, MD 08/05/20 2038

## 2020-08-05 NOTE — ED Provider Notes (Signed)
[facility] EMERGENCY DEPARTMENT Provider Note   CSN: [id number] Arrival date & time: 08/05/20  1035     History No chief complaint on file.   Juan Payne is a 65 y.o. male.  HPI Patient has medical history of coronary artery disease and coronary artery bypass graft surgery.  History of paroxysmal atrial fibrillation, not anticoagulated.  Baseline patient is healthy and active.  This morning at 915 patient's wife observed the patient to be confused.  He started asking the same questions over and over and did not remember what they were doing.  Patient denies any pain.  He is not aware of the situation.  He does keep repeating that he does not remember.  He can answer long-term questions correctly.  Patient is repeating to me that he is Jehovah's Witness and does not accept blood products, he is repetitively asking if his wife called, he is repetitively informing me that he has had heart surgery.  He reports he feels fine.    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   LIMA to LAD LEFT RADIAL ARTERY to OM2    Unstable angina (HCC) 05/11/2019    Patient Active Problem List   Diagnosis Date Noted   Hypertension 07/07/2020   Overweight (BMI 25.0-29.9) 07/07/2020   Hyperlipidemia    Encounter for wound re-check 07/31/2019   Postop check 06/19/2019   Depressed left ventricular ejection fraction 06/04/2019   Mixed hyperlipidemia 06/04/2019   PAF (paroxysmal atrial fibrillation) (HCC)    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   Coronary artery disease involving native coronary artery of native heart without angina pectoris 05/14/2019    Coronary artery disease 05/14/2019   Coronary artery disease involving native heart without angina pectoris    Near syncope 05/11/2019   Unstable angina (HCC) 05/11/2019   Refusal of blood transfusions as patient is Jehovah's Witness 05/11/2019   Chest pain 05/07/2019   Essential hypertension 05/07/2019    Past Surgical History:  Procedure Laterality Date   CORONARY ARTERY BYPASS GRAFT N/A 05/14/2019   Procedure: CORONARY ARTERY BYPASS GRAFTING (CABG) x TWO USING LEFT INTERNAL MAMMARY ARTERY AND LEFT RADIAL ARTERY;  Surgeon: Kerin Perna, MD;  Location: [facility] OR;  Service: Open Heart Surgery;  Laterality: N/A;   LEFT HEART CATH AND CORONARY ANGIOGRAPHY N/A 05/13/2019   Procedure: LEFT HEART CATH AND CORONARY ANGIOGRAPHY;  Surgeon: Runell Gess, MD;  Location: [facility] INVASIVE CV LAB;  Service: Cardiovascular;  Laterality: N/A;   RADIAL ARTERY HARVEST Left 05/14/2019   Procedure: RADIAL ARTERY HARVEST;  Surgeon: Kerin Perna, MD;  Location: [facility] OR;  Service: Open Heart Surgery;  Laterality: Left;   TEE WITHOUT CARDIOVERSION N/A 05/14/2019   Procedure: TRANSESOPHAGEAL ECHOCARDIOGRAM (TEE);  Surgeon: Donata Clay, Theron Arista, MD;  Location: [facility] OR;  Service: Open Heart Surgery;  Laterality: N/A;       Family History  Problem Relation Age of Onset   Hypertension Mother    Heart attack Father    Heart attack Brother     Social History   Tobacco Use   Smoking status: Never   Smokeless tobacco:  Never  Substance Use Topics   Alcohol use: Not Currently   Drug use: Never    Home Medications Prior to Admission medications   Medication Sig Start Date End Date Taking? Authorizing Provider  aspirin EC 81 MG tablet Take 81 mg by mouth daily. Swallow whole.    [provider]  atorvastatin (LIPITOR) 40 MG tablet Take 1 tablet (40 mg total) by mouth daily at 6 PM. 12/11/19   Tobb, Kardie, DO  carvedilol (COREG) 6.25 MG tablet Take 1 tablet (6.25 mg total) by mouth 2 (two) times daily.  12/11/19   Tobb, Kardie, DO  citalopram (CELEXA) 20 MG tablet Take 20 mg by mouth at bedtime.  05/03/19   [provider]  ezetimibe (ZETIA) 10 MG tablet Take 1 tablet (10 mg total) by mouth daily. 12/11/19 03/10/20  Tobb, Kardie, DO  hydrochlorothiazide (MICROZIDE) 12.5 MG capsule Take 1 capsule (12.5 mg total) by mouth daily. 07/07/20 10/05/20  Tobb, Lavona Mound, DO  hydrOXYzine (ATARAX/VISTARIL) 25 MG tablet Take 25 mg by mouth 3 (three) times daily. 09/14/19   [provider]  losartan (COZAAR) 50 MG tablet Take one tablet by mouth daily in the morning. Then take 1/2 tablet by mouth daily in the evening. Patient taking differently: Take one tablet by mouth daily in the morning. Then take 1 tablet by mouth daily in the evening. 12/11/19   Tobb, Kardie, DO    Allergies    Morphine and related  Review of Systems   Review of Systems Level 5 caveat cannot obtain review of systems due to patient mental status.  Unreliable for recall at this time. Physical Exam Updated Vital Signs BP (!) 169/107 (BP Location: Left Arm)   Pulse 77   Temp 99.1 F (37.3 C) (Oral)   Resp 10   Ht 5\' 9"  (1.753 m)   Wt 77.1 kg   SpO2 99%   BMI 25.10 kg/m   Physical Exam Constitutional:      Comments: Alert nontoxic clinically well in appearance  HENT:     Head: Normocephalic and atraumatic.     Nose: Nose normal.     Mouth/Throat:     Mouth: Mucous membranes are moist.     Pharynx: Oropharynx is clear.  Eyes:     Extraocular Movements: Extraocular movements intact.     Conjunctiva/sclera: Conjunctivae normal.     Pupils: Pupils are equal, round, and reactive to light.  Cardiovascular:     Rate and Rhythm: Normal rate and regular rhythm.     Heart sounds: Normal heart sounds.     Comments: Some of the distal pulses are not easily palpable but patient has warm and dry extremities with no signs of hypoperfusion or ischemic limbs. Pulmonary:     Effort: Pulmonary effort is normal.     Breath  sounds: Normal breath sounds.  Abdominal:     General: There is no distension.     Palpations: Abdomen is soft.     Tenderness: There is no abdominal tenderness. There is no guarding.  Musculoskeletal:        General: No swelling or tenderness. Normal range of motion.     Cervical back: Neck supple.     Right lower leg: No edema.     Left lower leg: No edema.  Skin:    General: Skin is warm and dry.  Neurological:     Comments: Patient is alert.  He is oriented to person.  He does have recall for his demographic  information.  He has recall for where he works.  He has recall for family members.  Patient correctly names objects.  Speech is clear.  He follows commands appropriately.  Cranial nerves II through XII intact.  No pronator drift.  Motor strength 5\5 upper and lower extremities.  Patient accurately does heel shin exam bilaterally.  Psychiatric:        Mood and Affect: Mood normal.    ED Results / Procedures / Treatments   Labs (all labs ordered are listed, but only abnormal results are displayed) Labs Reviewed - No data to display  EKG EKG Interpretation  Date/Time:  Wednesday August 05 2020 10:45:50 EDT Ventricular Rate:  77 PR Interval:  165 QRS Duration: 121 QT Interval:  402 QTC Calculation: 455 R Axis:   -54 Text Interpretation: Sinus rhythm Left bundle branch block duplicate, no change Confirmed by Arby Barrette [phone]) on 08/05/2020 11:21:21 AM  Radiology No results found.  Procedures Procedures   Medications Ordered in ED Medications - No data to display  ED Course  I have reviewed the triage vital signs and the nursing notes.  Pertinent labs & imaging results that were available during my care of the patient were reviewed by me and considered in my medical decision making (see chart for details).    MDM Rules/Calculators/A&P                           Consult: Reviewed Dr. Otelia Limes.  At this time, with symptoms very consistent with TGA, do not  activate code stroke.  We will proceed with stroke work-up and include MRI and EEG for TGA evaluation.  Neurology will consult.  Patient has been stable.  He continues to exhibit symptoms consistent with TGA.  Work-up in progress.  Disposition per final diagnostic findings and neurology recommendation. Final Clinical Impression(s) / ED Diagnoses Final diagnoses:  None    Rx / DC Orders ED Discharge Orders     None        Arby Barrette, MD 08/06/20 [phone]

## 2020-08-05 NOTE — ED Notes (Signed)
Family at bedside. 

## 2020-08-06 ENCOUNTER — Observation Stay (HOSPITAL_COMMUNITY)
Admission: EM | Admit: 2020-08-06 | Discharge: 2020-08-07 | Disposition: A | Discharge status: Home / Self Care | Payer: Medicare Other | Attending: Cardiovascular Disease | Admitting: Cardiovascular Disease

## 2020-08-06 ENCOUNTER — Telehealth: Payer: Self-pay | Admitting: Cardiology

## 2020-08-06 ENCOUNTER — Emergency Department (HOSPITAL_COMMUNITY): Payer: Medicare Other

## 2020-08-06 ENCOUNTER — Encounter (HOSPITAL_COMMUNITY): Payer: Self-pay

## 2020-08-06 DIAGNOSIS — R778 Other specified abnormalities of plasma proteins: Secondary | ICD-10-CM

## 2020-08-06 DIAGNOSIS — I10 Essential (primary) hypertension: Secondary | ICD-10-CM | POA: Diagnosis present

## 2020-08-06 DIAGNOSIS — R9431 Abnormal electrocardiogram [ECG] [EKG]: Secondary | ICD-10-CM

## 2020-08-06 DIAGNOSIS — R079 Chest pain, unspecified: Secondary | ICD-10-CM

## 2020-08-06 DIAGNOSIS — I2510 Atherosclerotic heart disease of native coronary artery without angina pectoris: Secondary | ICD-10-CM | POA: Diagnosis present

## 2020-08-06 DIAGNOSIS — R0789 Other chest pain: Principal | ICD-10-CM | POA: Insufficient documentation

## 2020-08-06 DIAGNOSIS — Z951 Presence of aortocoronary bypass graft: Secondary | ICD-10-CM | POA: Diagnosis not present

## 2020-08-06 DIAGNOSIS — Z79899 Other long term (current) drug therapy: Secondary | ICD-10-CM | POA: Insufficient documentation

## 2020-08-06 DIAGNOSIS — Z7982 Long term (current) use of aspirin: Secondary | ICD-10-CM | POA: Diagnosis not present

## 2020-08-06 DIAGNOSIS — I480 Paroxysmal atrial fibrillation: Secondary | ICD-10-CM | POA: Diagnosis not present

## 2020-08-06 DIAGNOSIS — I25110 Atherosclerotic heart disease of native coronary artery with unstable angina pectoris: Secondary | ICD-10-CM | POA: Insufficient documentation

## 2020-08-06 DIAGNOSIS — I25700 Atherosclerosis of coronary artery bypass graft(s), unspecified, with unstable angina pectoris: Secondary | ICD-10-CM | POA: Diagnosis not present

## 2020-08-06 DIAGNOSIS — R42 Dizziness and giddiness: Secondary | ICD-10-CM | POA: Diagnosis not present

## 2020-08-06 DIAGNOSIS — I4581 Long QT syndrome: Secondary | ICD-10-CM | POA: Insufficient documentation

## 2020-08-06 DIAGNOSIS — I200 Unstable angina: Secondary | ICD-10-CM | POA: Diagnosis not present

## 2020-08-06 DIAGNOSIS — R55 Syncope and collapse: Secondary | ICD-10-CM | POA: Diagnosis not present

## 2020-08-06 DIAGNOSIS — I499 Cardiac arrhythmia, unspecified: Secondary | ICD-10-CM | POA: Diagnosis not present

## 2020-08-06 LAB — CBC
HCT: 43 % (ref 39.0–52.0)
Hemoglobin: 15 g/dL (ref 13.0–17.0)
MCH: 32.6 pg (ref 26.0–34.0)
MCHC: 34.9 g/dL (ref 30.0–36.0)
MCV: 93.5 fL (ref 80.0–100.0)
Platelets: 245 10*3/uL (ref 150–400)
RBC: 4.6 MIL/uL (ref 4.22–5.81)
RDW: 11.9 % (ref 11.5–15.5)
WBC: 9.7 10*3/uL (ref 4.0–10.5)
nRBC: 0 % (ref 0.0–0.2)

## 2020-08-06 LAB — COMPREHENSIVE METABOLIC PANEL
ALT: 30 U/L (ref 0–44)
AST: 27 U/L (ref 15–41)
Albumin: 4 g/dL (ref 3.5–5.0)
Alkaline Phosphatase: 47 U/L (ref 38–126)
Anion gap: 9 (ref 5–15)
BUN: 15 mg/dL (ref 8–23)
CO2: 27 mmol/L (ref 22–32)
Calcium: 9.6 mg/dL (ref 8.9–10.3)
Chloride: 104 mmol/L (ref 98–111)
Creatinine, Ser: 0.89 mg/dL (ref 0.61–1.24)
GFR, Estimated: >60 mL/min (ref >60)
Glucose, Bld: 120 mg/dL — ABNORMAL HIGH (ref 70–99)
Potassium: 3.7 mmol/L (ref 3.5–5.1)
Sodium: 140 mmol/L (ref 135–145)
Total Bilirubin: 0.8 mg/dL (ref 0.3–1.2)
Total Protein: 7 g/dL (ref 6.5–8.1)

## 2020-08-06 LAB — TROPONIN I (HIGH SENSITIVITY)
Troponin I (High Sensitivity): 29 ng/L — ABNORMAL HIGH (ref <18)
Troponin I (High Sensitivity): 30 ng/L — ABNORMAL HIGH (ref <18)

## 2020-08-06 LAB — BRAIN NATRIURETIC PEPTIDE: B Natriuretic Peptide: 177.7 pg/mL — ABNORMAL HIGH (ref 0.0–100.0)

## 2020-08-06 MED ORDER — SODIUM CHLORIDE 0.9% FLUSH
3.0000 mL | Freq: Two times a day (BID) | INTRAVENOUS | Status: DC
  Administered 2020-08-06 – 2020-08-07 (×2): 3 mL via INTRAVENOUS

## 2020-08-06 MED ORDER — METOPROLOL TARTRATE 25 MG PO TABS
25.0000 mg | ORAL_TABLET | Freq: Once | ORAL | Status: AC
  Administered 2020-08-06: 25 mg via ORAL
  Filled 2020-08-06: qty 1

## 2020-08-06 MED ORDER — EZETIMIBE 10 MG PO TABS
10.0000 mg | ORAL_TABLET | Freq: Every day | ORAL | Status: DC
  Administered 2020-08-06 – 2020-08-07 (×2): 10 mg via ORAL
  Filled 2020-08-06 (×2): qty 1

## 2020-08-06 MED ORDER — LOSARTAN POTASSIUM 25 MG PO TABS
25.0000 mg | ORAL_TABLET | Freq: Every day | ORAL | Status: DC
  Administered 2020-08-06: 25 mg via ORAL
  Filled 2020-08-06: qty 1

## 2020-08-06 MED ORDER — HEPARIN SODIUM (PORCINE) 5000 UNIT/ML IJ SOLN
5000.0000 [IU] | Freq: Three times a day (TID) | INTRAMUSCULAR | Status: DC
  Administered 2020-08-06 – 2020-08-07 (×3): 5000 [IU] via SUBCUTANEOUS
  Filled 2020-08-06 (×4): qty 1

## 2020-08-06 MED ORDER — NITROGLYCERIN 0.4 MG SL SUBL: 0.4000 mg | SUBLINGUAL_TABLET | SUBLINGUAL | Status: DC | PRN

## 2020-08-06 MED ORDER — HYDROCHLOROTHIAZIDE 12.5 MG PO CAPS
12.5000 mg | ORAL_CAPSULE | Freq: Every day | ORAL | Status: DC
  Administered 2020-08-07: 12.5 mg via ORAL
  Filled 2020-08-06 (×2): qty 1

## 2020-08-06 MED ORDER — LOSARTAN POTASSIUM 50 MG PO TABS
50.0000 mg | ORAL_TABLET | Freq: Every day | ORAL | Status: DC
  Administered 2020-08-07: 50 mg via ORAL
  Filled 2020-08-06 (×2): qty 1

## 2020-08-06 MED ORDER — ALPRAZOLAM 0.25 MG PO TABS: 0.1250 mg | ORAL_TABLET | Freq: Two times a day (BID) | ORAL | Status: DC | PRN

## 2020-08-06 MED ORDER — ATORVASTATIN CALCIUM 40 MG PO TABS
40.0000 mg | ORAL_TABLET | Freq: Every day | ORAL | Status: DC
  Administered 2020-08-06 – 2020-08-07 (×2): 40 mg via ORAL
  Filled 2020-08-06 (×2): qty 1

## 2020-08-06 MED ORDER — CARVEDILOL 6.25 MG PO TABS
6.2500 mg | ORAL_TABLET | Freq: Two times a day (BID) | ORAL | Status: DC
  Administered 2020-08-06: 6.25 mg via ORAL
  Filled 2020-08-06: qty 2

## 2020-08-06 MED ORDER — ASPIRIN EC 81 MG PO TBEC
81.0000 mg | DELAYED_RELEASE_TABLET | Freq: Every day | ORAL | Status: DC
  Administered 2020-08-07: 81 mg via ORAL
  Filled 2020-08-06 (×2): qty 1

## 2020-08-06 NOTE — Telephone Encounter (Signed)
Pt c/o of Chest Pain: 1. Are you having CP right now? Left side  2. Are you experiencing any other symptoms (ex. SOB, nausea, vomiting, sweating)? No  3. How long have you been experiencing CP? This morning when he woke up 4. Is your CP continuous or coming and going? Continuous  5. Have you taken Nitroglycerin? No Took a 325 Asprin when he woke up and just now

## 2020-08-06 NOTE — ED Provider Notes (Signed)
  Physical Exam  BP 107/74   Pulse 60   Temp 98.1 F (36.7 C) (Oral)   Resp 14   Ht 5\' 9"  (1.753 m)   Wt 77.1 kg   SpO2 96%   BMI 25.10 kg/m   Physical Exam Vitals and nursing note reviewed.  Constitutional:      Appearance: He is well-developed.  HENT:     Head: Normocephalic and atraumatic.  Eyes:     Conjunctiva/sclera: Conjunctivae normal.  Cardiovascular:     Rate and Rhythm: Normal rate and regular rhythm.     Heart sounds: No murmur heard. Pulmonary:     Effort: Pulmonary effort is normal. No respiratory distress.     Breath sounds: Normal breath sounds.  Abdominal:     Palpations: Abdomen is soft.     Tenderness: There is no abdominal tenderness.  Musculoskeletal:     Cervical back: Neck supple.  Skin:    General: Skin is warm and dry.  Neurological:     Mental Status: He is alert.    ED Course/Procedures   Clinical Course as of 08/07/20 0040  Thu Aug 06, 2020  1337 Patient's initial troponin elevated at 29 [JK]  1540 Second troponin not significantly changed [JK]    Clinical Course User Index [JK] Aug 08, 2020, MD    Procedures  MDM  65 year old male presenting with chest tightness and dizziness for the past 3 hours.  Recently discharged from the emergency department.  Had been seen with concern for transient global amnesia.  Troponins 29 and 30, cardiology to see.  The patient was evaluated by cardiology in the emergency department who recommended admission for further work-up.  The patient was subsequently admitted in stable condition.     76, MD 08/07/20 [phone]

## 2020-08-06 NOTE — Telephone Encounter (Signed)
Spoke to the patient just now and he let me know that he has been having chest pain on his left side around an hour ago. He states that he took aspirin 325 mg two tablets this morning. He is lightheaded right now. He is not nauseous at this time. He is not sweating.   He was seen in the ED yesterday for amnesia but did not have chest pain at that time.   I advised that he proceed to the ED at this time and he states that he will do so.

## 2020-08-06 NOTE — ED Triage Notes (Signed)
Brought in by EMS for chest tightness and dizziness x 3 hours. Seen here yesterday and dx with transient global amnesia. Hx of open heart surgery last year. Hx of irregular HR after heart surgery. 324mg  Aspirin given by EMS.

## 2020-08-06 NOTE — ED Provider Notes (Signed)
[facility] EMERGENCY DEPARTMENT Provider Note   CSN: [id number] Arrival date & time: 08/06/20  1132     History Chief Complaint  Patient presents with   Chest Pain    EATHAN GROMAN is a 65 y.o. male.   Chest Pain  HPI: A 65 year old patient with a history of hypertension and hypercholesterolemia presents for evaluation of chest pain. Initial onset of pain was approximately 3-6 hours ago. The patient's chest pain is described as heaviness/pressure/tightness and is not worse with exertion. The patient complains of nausea. The patient's chest pain is middle- or left-sided, is not well-localized, is not sharp and does not radiate to the arms/jaw/neck. The patient denies diaphoresis. The patient has a family history of coronary artery disease in a first-degree relative with onset less than age 66. The patient has no history of stroke, has no history of peripheral artery disease, has not smoked in the past 90 days, denies any history of treated diabetes and does not have an elevated BMI (>=30).   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   LIMA to LAD LEFT RADIAL ARTERY to OM2    Unstable angina (HCC) 05/11/2019    Patient Active Problem List   Diagnosis Date Noted   Hypertension 07/07/2020   Overweight (BMI 25.0-29.9) 07/07/2020   Hyperlipidemia    Encounter for wound re-check 07/31/2019   Postop check 06/19/2019   Depressed left ventricular ejection fraction 06/04/2019   Mixed hyperlipidemia 06/04/2019   PAF (paroxysmal atrial fibrillation) (HCC)    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   Coronary artery disease involving  native coronary artery of native heart without angina pectoris 05/14/2019   Coronary artery disease 05/14/2019   Coronary artery disease involving native heart without angina pectoris    Near syncope 05/11/2019   Unstable angina (HCC) 05/11/2019   Refusal of blood transfusions as patient is Jehovah's Witness 05/11/2019   Chest pain 05/07/2019   Essential hypertension 05/07/2019    Past Surgical History:  Procedure Laterality Date   CORONARY ARTERY BYPASS GRAFT N/A 05/14/2019   Procedure: CORONARY ARTERY BYPASS GRAFTING (CABG) x TWO USING LEFT INTERNAL MAMMARY ARTERY AND LEFT RADIAL ARTERY;  Surgeon: Kerin Perna, MD;  Location: [facility] OR;  Service: Open Heart Surgery;  Laterality: N/A;   LEFT HEART CATH AND CORONARY ANGIOGRAPHY N/A 05/13/2019   Procedure: LEFT HEART CATH AND CORONARY ANGIOGRAPHY;  Surgeon: Runell Gess, MD;  Location: [facility] INVASIVE CV LAB;  Service: Cardiovascular;  Laterality: N/A;   RADIAL ARTERY HARVEST Left 05/14/2019   Procedure: RADIAL ARTERY HARVEST;  Surgeon: Kerin Perna, MD;  Location: [facility] OR;  Service: Open Heart Surgery;  Laterality: Left;   TEE WITHOUT CARDIOVERSION N/A 05/14/2019   Procedure: TRANSESOPHAGEAL ECHOCARDIOGRAM (TEE);  Surgeon: Donata Clay, Theron Arista, MD;  Location: [facility] OR;  Service: Open Heart Surgery;  Laterality: N/A;       Family History  Problem Relation Age of Onset   Hypertension Mother    Heart attack Father    Heart attack Brother     Social History   Tobacco Use   Smoking status: Never  Smokeless tobacco: Never  Substance Use Topics   Alcohol use: Not Currently   Drug use: Never    Home Medications Prior to Admission medications   Medication Sig Start Date End Date Taking? Authorizing Provider  aspirin EC 81 MG tablet Take 81 mg by mouth daily. Swallow whole.    [provider]  atorvastatin (LIPITOR) 40 MG tablet Take 1 tablet (40 mg total) by mouth daily at 6 PM. 12/11/19   Tobb, Kardie, DO  carvedilol (COREG) 6.25  MG tablet Take 1 tablet (6.25 mg total) by mouth 2 (two) times daily. 12/11/19   Tobb, Kardie, DO  citalopram (CELEXA) 20 MG tablet Take 20 mg by mouth at bedtime.  05/03/19   [provider]  ezetimibe (ZETIA) 10 MG tablet Take 1 tablet (10 mg total) by mouth daily. 12/11/19 08/06/20  Tobb, Kardie, DO  hydrochlorothiazide (MICROZIDE) 12.5 MG capsule Take 1 capsule (12.5 mg total) by mouth daily. 07/07/20 10/05/20  Tobb, Lavona MoundKardie, DO  hydrOXYzine (ATARAX/VISTARIL) 25 MG tablet Take 25 mg by mouth 3 (three) times daily. 09/14/19   [provider]  losartan (COZAAR) 50 MG tablet Take one tablet by mouth daily in the morning. Then take 1/2 tablet by mouth daily in the evening. Patient taking differently: Take one tablet by mouth daily in the morning. Then take 1 tablet by mouth daily in the evening. 12/11/19   Tobb, Kardie, DO    Allergies    Morphine and related  Review of Systems   Review of Systems  Cardiovascular:  Positive for chest pain.  All other systems reviewed and are negative.  Physical Exam Updated Vital Signs BP 120/82   Pulse 85   Temp 98.1 F (36.7 C) (Oral)   Resp 17   Ht 1.753 m (5\' 9" )   Wt 77.1 kg   SpO2 97%   BMI 25.10 kg/m   Physical Exam Vitals and nursing note reviewed.  Constitutional:      General: He is not in acute distress.    Appearance: He is well-developed.  HENT:     Head: Normocephalic and atraumatic.     Right Ear: External ear normal.     Left Ear: External ear normal.  Eyes:     General: No scleral icterus.       Right eye: No discharge.        Left eye: No discharge.     Conjunctiva/sclera: Conjunctivae normal.  Neck:     Trachea: No tracheal deviation.  Cardiovascular:     Rate and Rhythm: Normal rate and regular rhythm.  Pulmonary:     Effort: Pulmonary effort is normal. No respiratory distress.     Breath sounds: Normal breath sounds. No stridor. No wheezing or rales.  Abdominal:     General: Bowel sounds are normal.  There is no distension.     Palpations: Abdomen is soft.     Tenderness: There is no abdominal tenderness. There is no guarding or rebound.  Musculoskeletal:        General: No tenderness or deformity.     Cervical back: Neck supple.  Skin:    General: Skin is warm and dry.     Findings: No rash.  Neurological:     General: No focal deficit present.     Mental Status: He is alert.     Cranial Nerves: No cranial nerve deficit (no facial droop, extraocular movements intact, no slurred speech).     Sensory: No sensory deficit.  Motor: No abnormal muscle tone or seizure activity.     Coordination: Coordination normal.  Psychiatric:        Mood and Affect: Mood normal.    ED Results / Procedures / Treatments   Labs (all labs ordered are listed, but only abnormal results are displayed) Labs Reviewed  COMPREHENSIVE METABOLIC PANEL - Abnormal; Notable for the following components:      Result Value   Glucose, Bld 120 (*)    All other components within normal limits  TROPONIN I (HIGH SENSITIVITY) - Abnormal; Notable for the following components:   Troponin I (High Sensitivity) 29 (*)    All other components within normal limits  CBC  CBG MONITORING, ED  TROPONIN I (HIGH SENSITIVITY)    EKG EKG Interpretation  Date/Time:  Thursday August 06 2020 11:45:36 EDT Ventricular Rate:  72 PR Interval:  173 QRS Duration: 122 QT Interval:  494 QTC Calculation: 541 R Axis:   -60 Text Interpretation: Sinus rhythm Left bundle branch block t wave changes new since last tracing Confirmed by Linwood Dibbles [phone]) on 08/06/2020 12:04:36 PM  Radiology CT HEAD WO CONTRAST  Result Date: 08/05/2020 CLINICAL DATA:  Neuro deficit, acute, stroke suspected EXAM: CT HEAD WITHOUT CONTRAST TECHNIQUE: Contiguous axial images were obtained from the base of the skull through the vertex without intravenous contrast. COMPARISON:  Head CT 09/06/2019 report, images not retrievable at the time of this exam  FINDINGS: Brain: No evidence of acute intracranial hemorrhage. Left frontal lobe white matter hypodensities, similar in description to prior CT report in August 2021. There are no new areas of focal hypoattenuation or loss of gray-white matter differentiation. There is no extra-axial collection. Vascular: No hyperdense vessel or unexpected calcification. Skull: Normal. Negative for fracture or focal lesion. Sinuses/Orbits: Scattered paranasal sinus mucosal thickening. Other: None. IMPRESSION: No acute intracranial abnormality. Left frontal white matter hypodensities, likely representing chronic small vessel ischemic disease. If there is concern for acute infarct, recommend MRI. Electronically Signed   By: Caprice Renshaw   On: 08/05/2020 14:10   MR BRAIN WO CONTRAST  Result Date: 08/05/2020 CLINICAL DATA:  Neuro deficit, acute stroke suspected. EXAM: MRI HEAD WITHOUT CONTRAST TECHNIQUE: Multiplanar, multiecho pulse sequences of the brain and surrounding structures were obtained without intravenous contrast. COMPARISON:  Same day CT head.  MRI May 07, 2019. FINDINGS: Brain: No acute infarction, hemorrhage, hydrocephalus, extra-axial collection or mass lesion. No substantial change in overall mild scattered T2/FLAIR hyperintensities within the white matter, most conspicuous in the left frontal lobe. Mildly prominent retro cerebellar CSF, similar to prior. Vascular: Major arterial flow voids are maintained skull base. Skull and upper cervical spine: Normal marrow signal. Sinuses/Orbits: Mild paranasal sinus mucosal thickening. Unremarkable orbits. Other: No mastoid effusions. IMPRESSION: 1. No evidence of acute intracranial abnormality. Specifically, no acute infarct. 2. No substantial change in overall mild scattered T2/FLAIR hyperintensities within the white matter, which are nonspecific but most likely related to chronic microvascular ischemic disease. Electronically Signed   By: Feliberto Harts MD   On:  08/05/2020 19:35   DG Chest Portable 1 View  Result Date: 08/06/2020 CLINICAL DATA:  Chest tightness and dizziness EXAM: PORTABLE CHEST 1 VIEW COMPARISON:  None. FINDINGS: Cardiac and mediastinal contours are unchanged status post median sternotomy. Unchanged mild tortuosity of the thoracic aorta. Lungs are clear.  No large pleural effusion or pneumothorax. IMPRESSION: Clear lungs. Electronically Signed   By: Allegra Lai MD   On: 08/06/2020 13:10   EEG adult  Result  Date: 08/05/2020 Charlsie Quest, MD     08/05/2020  8:09 PM Patient Name: CAMBRIDGE DELEO MRN: [id number] Epilepsy Attending: Charlsie Quest Referring Physician/Provider: Dr Arby Barrette Date: 08/05/2020 Duration: 22.51 mins Patient history: 65yo M with ams. EEG to evaluate for seizure Level of alertness: Awake AEDs during EEG study: None Technical aspects: This EEG study was done with scalp electrodes positioned according to the 10-20 International system of electrode placement. Electrical activity was acquired at a sampling rate of 500Hz  and reviewed with a high frequency filter of 70Hz  and a low frequency filter of 1Hz . EEG data were recorded continuously and digitally stored. Description: The posterior dominant rhythm consists of 9-10 Hz activity of moderate voltage (25-35 uV) seen predominantly in posterior head regions, symmetric and reactive to eye opening and eye closing. Hyperventilation and photic stimulation were not performed.   IMPRESSION: This study is within normal limits. No seizures or epileptiform discharges were seen throughout the recording.    Procedures Procedures   Medications Ordered in ED Medications  metoprolol tartrate (LOPRESSOR) tablet 25 mg (25 mg Oral Given 08/06/20 1318)    ED Course  I have reviewed the triage vital signs and the nursing notes.  Pertinent labs & imaging results that were available during my care of the patient were reviewed by me and considered in my medical  decision making (see chart for details).  Clinical Course as of 08/07/20 0706  Thu Aug 06, 2020  1337 Patient's initial troponin elevated at 29 [JK]  1540 Second troponin not significantly changed [JK]    Clinical Course User Index [JK] 08/08/20, MD   MDM Rules/Calculators/A&P HEAR Score: 6                         Patient presented to the ED for evaluation of chest tightness.  Patient was recently in the emergency room for evaluation of transient global amnesia.  Patient states however today chest discomfort that is similar to his prior anginal symptoms.  Patient does have known history of coronary artery disease status post bypass.  Patient was given aspirin and symptoms have improved.  Initial troponin was slightly elevated.  Presentation concerning for the possibility acute coronary syndrome.  We will check delta troponin.  I consulted with cardiology who will evaluate the patient in the ED. Final Clinical Impression(s) / ED Diagnoses Final diagnoses:  Chest pain, unspecified type  Elevated troponin    Rx / DC Orders ED Discharge Orders     None        08/09/20, MD 08/07/20 [phone]

## 2020-08-06 NOTE — Consult Note (Deleted)
Cardiology Consultation:   Patient ID: Juan Payne MRN: [id number]; DOB: 29-May-1955  Admit date: 08/06/2020 Date of Consult: 08/06/2020  PCP:  Street, Stephanie Coup, MD   [facility] Providers Cardiologist:  Thomasene Ripple, DO   {   Patient Profile:   Juan Payne is a 65 y.o. male with a hx of CAD with hx of CABG ( LIMA to LAD, left radial artery to OM2 on 05/14/19), post -op A fib (CABG) on amiodarone, HTN, HLD,  who is being seen 08/06/2020 for the evaluation of chest pressure and elevated Trop at the request of  Dr. Lynelle Doctor.   FYI: Jehovah's witness not accepting blood products   History of Present Illness:   Juan Payne follows Dr Servando Salina for CAD, had hx of chest pain in 04/2019, where he visited ER and had a positive NM stress test. He was transferred to [facility] and underwent cardiac cath on 05/13/19 which showed proximal third of LAD with diffuse stenosis and high grade small to medium size second marginal branch stenosis and  a high-grade large ostial proximal and mid third obtuse marginal branch stenosis with normal LV function. He was recommended CABG, which was done by Dr Zenaida Niece on 05/13/19 with LIMA to LAD and left radial artery free graft to circumflex marginal. He did developed post-op A fib, which he was placed on amiodarone, anticoagulation was not used.   He was seen in the office follow up, had c/o od dizziness, stopped on HCTZ-losartan and placed on losartan 50mg  BID only on 06/2019 visit. He was taken off amiodarone sometime in 2021. He was last by Dr 2022 in the office on 07/07/20, was doing well, BP was mildly elevated, he was resumed on low dose HCTZ 12.5mg  daily while continued on Coreg and losartan. He is maintain on ASA and Lipitor for medical therapy.  He was in the ER 08/05/20 for confusion, was seen by neurology, had impaired short term memory on exam, CTH and MRI of brain negative for acute findings. EEG was normal and showed no seizure or epileptiform discharges. Presentation  was felt due to transient global amnesia. He was released home.    Patient presented to ER today via EMS, c/o chest tightness, dizziness at home.  Patient states he has been experiencing intermittent chest tightness briefly for a while, never mentioned to his cardiologist as its not bothering him much.  He woke up today around 7 AM, around 9 AM he noted left-sided chest tightness with pain.  He was talking on the phone with his friend, did not engage in any exertional activity.  He reports associated lightheadedness with a flush sensation, felt he was about to faint.  Chest discomfort is similar to symptoms prior to his CABG, though not severe enough, and did not radiate down his left arm.  He took total 3 aspirin 325mg  tablets, and felt the pain starting to ease when he was in route to ER. He denied any shortness of breath, syncope, fever, chills, coughing, leg edema, heart palpitation or irregular heartbeat.  He felt he has been gaining weight.  He reports that his wife is fighting with cancer and had a complication from recent chemotherapy.  He admits that stress level has been high lately.  He is off his Celexa for 3 days and needs refill.   Diagnostic showed unremarkable CBC, CMP. CXR showed no acute findings.  HS trop 29 x1. Labs from yesterday ER visit showed negative flu and COVID. UA unremarkable. Negative urine tox  and blood ETOH level. EKG today showed sinus rhythm, IVCD, TWI of inferolateral leads that appears changd from EKG on 08/05/20, QT prolongation. Patient is hypertensive 153/96 at ER, otherwise stable. He was given ASA and metoprolol 25mg  x1 at ED. Subsequently cardiology is asked to see the patient.     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   LIMA to LAD LEFT RADIAL ARTERY to OM2    Unstable angina (HCC) 05/11/2019    Past Surgical History:  Procedure Laterality Date   CORONARY ARTERY BYPASS GRAFT N/A 05/14/2019   Procedure: CORONARY ARTERY BYPASS GRAFTING (CABG) x TWO USING LEFT INTERNAL MAMMARY ARTERY AND LEFT RADIAL ARTERY;  Surgeon: 07/14/2019, MD;  Location: [facility] OR;  Service: Open Heart Surgery;  Laterality: N/A;   LEFT HEART CATH AND CORONARY ANGIOGRAPHY N/A 05/13/2019   Procedure: LEFT HEART CATH AND CORONARY ANGIOGRAPHY;  Surgeon: 07/13/2019, MD;  Location: [facility] INVASIVE CV LAB;  Service: Cardiovascular;  Laterality: N/A;   RADIAL ARTERY HARVEST Left 05/14/2019   Procedure: RADIAL ARTERY HARVEST;  Surgeon: 07/14/2019, MD;  Location: [facility] OR;  Service: Open Heart Surgery;  Laterality: Left;   TEE WITHOUT CARDIOVERSION N/A 05/14/2019   Procedure: TRANSESOPHAGEAL ECHOCARDIOGRAM (TEE);  Surgeon: 07/14/2019, Donata Clay, MD;  Location: [facility] OR;  Service: Open Heart Surgery;  Laterality: N/A;     Home Medications:  Prior to Admission medications   Medication Sig Start Date End Date Taking? Authorizing Provider  aspirin EC 81 MG tablet Take 81 mg by mouth daily. Swallow whole.    [provider]  atorvastatin (LIPITOR) 40 MG tablet Take 1 tablet (40 mg total) by mouth daily at 6 PM. 12/11/19   Tobb, Kardie, DO  carvedilol (COREG) 6.25 MG tablet Take 1 tablet (6.25 mg total) by mouth 2 (two) times daily. 12/11/19   Tobb, Kardie, DO  citalopram (CELEXA) 20 MG tablet Take 20 mg by mouth at bedtime.  05/03/19   [provider]  ezetimibe (ZETIA) 10 MG tablet Take 1 tablet (10 mg total) by mouth daily. 12/11/19 08/06/20  Tobb, Kardie, DO  hydrochlorothiazide (MICROZIDE) 12.5 MG capsule Take 1 capsule (12.5 mg total) by mouth daily. 07/07/20 10/05/20  Tobb, 10/07/20, DO  hydrOXYzine (ATARAX/VISTARIL) 25 MG  tablet Take 25 mg by mouth 3 (three) times daily. 09/14/19   [provider]  losartan (COZAAR) 50 MG tablet Take one tablet by mouth daily in the morning. Then take 1/2 tablet by mouth daily in the evening. Patient taking differently: Take one tablet by mouth daily in the morning. Then take 1 tablet by mouth daily in the evening. 12/11/19   14/1/21, DO    Inpatient Medications: Scheduled Meds:  Continuous Infusions:  PRN Meds:   Allergies:    Allergies  Allergen Reactions   Morphine And Related Other (See Comments)    Drops BP     Social History:   Social History   Socioeconomic History   Marital status: Married    Spouse name: Not on file   Number of children: Not on file   Years of education: Not on  file   Highest education level: Not on file  Occupational History   Not on file  Tobacco Use   Smoking status: Never   Smokeless tobacco: Never  Substance and Sexual Activity   Alcohol use: Not Currently   Drug use: Never   Sexual activity: Not on file  Other Topics Concern   Not on file  Social History Narrative   Not on file   Social Determinants of Health   Financial Resource Strain: Not on file  Food Insecurity: Not on file  Transportation Needs: Not on file  Physical Activity: Not on file  Stress: Not on file  Social Connections: Not on file  Intimate Partner Violence: Not on file    Family History:    Family History  Problem Relation Age of Onset   Hypertension Mother    Heart attack Father    Heart attack Brother      ROS:  Constitutional: see HPI  Eyes: Denied vision change or loss Ears/Nose/Mouth/Throat: Denied ear ache, sore throat, coughing, sinus pain Cardiovascular: see HPI  Respiratory: denied shortness of breath Gastrointestinal: Denied nausea, vomiting, abdominal pain, diarrhea Genital/Urinary: Denied dysuria, hematuria, urinary frequency/urgency Musculoskeletal: Denied muscle ache, joint pain, weakness Skin: Denied rash,  wound Neuro: see HPI  Psych: Denid history of depression/anxiety  Endocrine: Denied history of diabetes   Physical Exam/Data:   Vitals:   08/06/20 1400 08/06/20 1430 08/06/20 1500 08/06/20 1530  BP: 116/87 120/82 107/74 115/77  Pulse: 87 85 60 (!) 57  Resp: 18 17 14 18   Temp:      TempSrc:      SpO2: 92% 97% 96% 95%  Weight:      Height:       No intake or output data in the 24 hours ending 08/06/20 1628 Last 3 Weights 08/06/2020 08/05/2020 07/07/2020  Weight (lbs) 169 lb 15.6 oz 170 lb 173 lb  Weight (kg) 77.1 kg 77.111 kg 78.472 kg     Body mass index is 25.1 kg/m.    Vitals:  Vitals:   08/06/20 1500 08/06/20 1530  BP: 107/74 115/77  Pulse: 60 (!) 57  Resp: 14 18  Temp:    SpO2: 96% 95%   General Appearance: In no apparent distress, laying in bed HEENT: Normocephalic, atraumatic. EOMs intact.  Neck: Supple, trachea midline, no JVD Cardiovascular: Regular rate and rhythm, normal S1-S2,  no murmur/rub/gallop Respiratory: Resting breathing unlabored, lungs sounds clear to auscultation bilaterally, no use of accessory muscles. On room air.  No wheezes, rales or rhonchi.   Gastrointestinal: Bowel sounds positive, abdomen soft, non-tender, non-distended.  Extremities: Able to move all extremities in bed without difficulty, no edema/cyanosis/clubbing Genitourinary: genital exam not performed Musculoskeletal: Normal muscle bulk and tone, muscle strength 5/5 throughout Skin: Intact, warm, dry. No rashes or petechiae noted in exposed areas.  Neurologic: Alert, oriented to person, place and time. Fluent speech, no cognitive deficit,  no gross focal neuro deficit Psychiatric: Mild anxiety    EKG:  The EKG was personally reviewed and demonstrates:    EKG today showed sinus rhythm, IVCD, TWI of inferolateral leads that appears changed from EKG on 08/05/20, QT prolongation.   Telemetry:  Telemetry was personally reviewed and demonstrates:  Sinus rhythm with rate of 80s, noted  fair frequent PACs and PVCs  Relevant CV Studies:  Left heart cath from 05/13/19:   IMPRESSION: Juan Payne has left dominant system with severe calcified vessels.  His entire proximal third of his LAD is calcified with diffuse  50 to 60% stenosis and 90% stenosis within this.  He had a high-grade small to medium size second marginal branch stenosis and a high-grade large ostial proximal and mid third obtuse marginal branch stenosis with normal LV function.  I believe the best revascularization strategy would be complete revascularization.  He is not diabetic would be a good candidate for all all arterial conduit procedure if possible.  The sheath was removed and a TR band was placed on the right wrist to achieve patent hemostasis.  The patient left lab in stable condition.  He will be reheparinized 4 hours after sheath removal.  T CTS has been notified.     Laboratory Data:  High Sensitivity Troponin:   Recent Labs  Lab 08/06/20 1211 08/06/20 1450  TROPONINIHS 29* 30*     Chemistry Recent Labs  Lab 08/05/20 1133 08/05/20 1255 08/06/20 1211  NA 137 137 140  K 5.5* 3.7 3.7  CL 101 102 104  CO2  --  27 27  GLUCOSE 109* 112* 120*  BUN 27* 15 15  CREATININE 0.90 0.92 0.89  CALCIUM  --  9.1 9.6  GFRNONAA  --  >60 >60  ANIONGAP  --  8 9    Recent Labs  Lab 08/05/20 1255 08/06/20 1211  PROT 6.9 7.0  ALBUMIN 4.0 4.0  AST 28 27  ALT 31 30  ALKPHOS 45 47  BILITOT 0.9 0.8   Hematology Recent Labs  Lab 08/05/20 1106 08/05/20 1133 08/06/20 1211  WBC 10.4  --  9.7  RBC 4.59  --  4.60  HGB 15.0 15.0 15.0  HCT 43.5 44.0 43.0  MCV 94.8  --  93.5  MCH 32.7  --  32.6  MCHC 34.5  --  34.9  RDW 11.9  --  11.9  PLT 248  --  245   BNPNo results for input(s): BNP, PROBNP in the last 168 hours.  DDimer No results for input(s): DDIMER in the last 168 hours.   Radiology/Studies:  CT HEAD WO CONTRAST  Result Date: 08/05/2020 CLINICAL DATA:  Neuro deficit, acute, stroke  suspected EXAM: CT HEAD WITHOUT CONTRAST TECHNIQUE: Contiguous axial images were obtained from the base of the skull through the vertex without intravenous contrast. COMPARISON:  Head CT 09/06/2019 report, images not retrievable at the time of this exam FINDINGS: Brain: No evidence of acute intracranial hemorrhage. Left frontal lobe white matter hypodensities, similar in description to prior CT report in August 2021. There are no new areas of focal hypoattenuation or loss of gray-white matter differentiation. There is no extra-axial collection. Vascular: No hyperdense vessel or unexpected calcification. Skull: Normal. Negative for fracture or focal lesion. Sinuses/Orbits: Scattered paranasal sinus mucosal thickening. Other: None. IMPRESSION: No acute intracranial abnormality. Left frontal white matter hypodensities, likely representing chronic small vessel ischemic disease. If there is concern for acute infarct, recommend MRI. Electronically Signed   By: Caprice Renshaw   On: 08/05/2020 14:10   MR BRAIN WO CONTRAST  Result Date: 08/05/2020 CLINICAL DATA:  Neuro deficit, acute stroke suspected. EXAM: MRI HEAD WITHOUT CONTRAST TECHNIQUE: Multiplanar, multiecho pulse sequences of the brain and surrounding structures were obtained without intravenous contrast. COMPARISON:  Same day CT head.  MRI May 07, 2019. FINDINGS: Brain: No acute infarction, hemorrhage, hydrocephalus, extra-axial collection or mass lesion. No substantial change in overall mild scattered T2/FLAIR hyperintensities within the white matter, most conspicuous in the left frontal lobe. Mildly prominent retro cerebellar CSF, similar to prior. Vascular: Major arterial flow voids are  maintained skull base. Skull and upper cervical spine: Normal marrow signal. Sinuses/Orbits: Mild paranasal sinus mucosal thickening. Unremarkable orbits. Other: No mastoid effusions. IMPRESSION: 1. No evidence of acute intracranial abnormality. Specifically, no acute infarct.  2. No substantial change in overall mild scattered T2/FLAIR hyperintensities within the white matter, which are nonspecific but most likely related to chronic microvascular ischemic disease. Electronically Signed   By: Feliberto Harts MD   On: 08/05/2020 19:35   DG Chest Portable 1 View  Result Date: 08/06/2020 CLINICAL DATA:  Chest tightness and dizziness EXAM: PORTABLE CHEST 1 VIEW COMPARISON:  None. FINDINGS: Cardiac and mediastinal contours are unchanged status post median sternotomy. Unchanged mild tortuosity of the thoracic aorta. Lungs are clear.  No large pleural effusion or pneumothorax. IMPRESSION: Clear lungs. Electronically Signed   By: Allegra Lai MD   On: 08/06/2020 13:10   EEG adult  Result Date: 08/05/2020 Charlsie Quest, MD     08/05/2020  8:09 PM Patient Name: Juan Payne MRN: [id number] Epilepsy Attending: Charlsie Quest Referring Physician/Provider: Dr Arby Barrette Date: 08/05/2020 Duration: 22.51 mins Patient history: 65yo M with ams. EEG to evaluate for seizure Level of alertness: Awake AEDs during EEG study: None Technical aspects: This EEG study was done with scalp electrodes positioned according to the 10-20 International system of electrode placement. Electrical activity was acquired at a sampling rate of 500Hz  and reviewed with a high frequency filter of 70Hz  and a low frequency filter of 1Hz . EEG data were recorded continuously and digitally stored. Description: The posterior dominant rhythm consists of 9-10 Hz activity of moderate voltage (25-35 uV) seen predominantly in posterior head regions, symmetric and reactive to eye opening and eye closing. Hyperventilation and photic stimulation were not performed.   IMPRESSION: This study is within normal limits. No seizures or epileptiform discharges were seen throughout the recording. Priyanka     Assessment and Plan:   Chest pain Elevated trop  QT prolongation  CAD with hx of CABGX2 in 05/13/2019  -  presented with chest tighness and pressure with associated lightheadedness for 2 hours - EKG with new TWI of inferolateral leads and QT prolongation  - Hs trop 29 >30 - ACS is ruled out, currently chest pain-free - Etiology include anxiety versus Takotsubo CM versus SSRI withdrawal versus coronary spasm  - Continue medical therapy, carvedilol, losartan, Lipitor, zetia , will prescribe PRN nitro - will obtain Echo to evaluate LV  - will arrange stress Myoveiw tomorrow , NPO at midnight   HTN - BP improved after receiving metoprolol at ED - resume home regimen with HCTZ, Coreg, and losartan; may switch HCTZ to amlodipine if needed   HLD  - LDL 66 from 07/10/20, at goal, continue zetia and lipitor   Anxiety  SSRI abrupt discontinuation  - on Celexa historically, medication ran out for 3 days, needs refill - given current QT prolongation, will hold off resume   Hx of post-op A fib - occurred after CABG in 2021, no longer on amiodarone and had no recurrence so far     Risk Assessment/Risk Scores:   HEAR Score (for undifferentiated chest pain):  HEAR Score: 6{       For questions or updates, please contact [facility] Please consult [email or web address] for contact info under    Signed, 07/13/2019, NP  08/06/2020 4:28 PM

## 2020-08-06 NOTE — H&P (Addendum)
Cardiology Admission History and Physical:   Patient ID: JADARIUS COMMONS MRN: [id number]; DOB: 1955-10-04   Admission date: 08/06/2020  PCP:  Street, Stephanie Coup, MD   [facility] Providers Cardiologist:  Thomasene Ripple, DO   {   Chief Complaint:  chest pain  Patient Profile:   NICHAEL EHLY is a 65 y.o. male with a hx of CAD with hx of CABG ( LIMA to LAD, left radial artery to OM2 on 05/14/19), post -op A fib (CABG) on amiodarone, HTN, HLD,  who is being seen 08/06/2020 for the evaluation of chest pressure and elevated Trop at the request of  Dr. Lynelle Doctor.   FYI: Jehovah's witness not accepting blood products  History of Present Illness:    Mr. Schreck follows Dr Servando Salina for CAD, had hx of chest pain in 04/2019, where he visited ER and had a positive NM stress test. He was transferred to [facility] and underwent cardiac cath on 05/13/19 which showed proximal third of LAD with diffuse stenosis and high grade small to medium size second marginal branch stenosis and  a high-grade large ostial proximal and mid third obtuse marginal branch stenosis with normal LV function. He was recommended CABG, which was done by Dr Zenaida Niece on 05/13/19 with LIMA to LAD and left radial artery free graft to circumflex marginal. He did developed post-op A fib, which he was placed on amiodarone, anticoagulation was not used.   He was seen in the office follow up, had c/o od dizziness, stopped on HCTZ-losartan and placed on losartan 50mg  BID only on 06/2019 visit. He was taken off amiodarone sometime in 2021. He was last by Dr 2022 in the office on 07/07/20, was doing well, BP was mildly elevated, he was resumed on low dose HCTZ 12.5mg  daily while continued on Coreg and losartan. He is maintain on ASA and Lipitor for medical therapy.   He was in the ER 08/05/20 for confusion, was seen by neurology, had impaired short term memory on exam, CTH and MRI of brain negative for acute findings. EEG was normal and showed no seizure or  epileptiform discharges. Presentation was felt due to transient global amnesia. He was released home.   Patient presented to ER today via EMS, c/o chest tightness, dizziness at home.  Patient states he has been experiencing intermittent chest tightness briefly for a while, never mentioned to his cardiologist as its not bothering him much.  He woke up today around 7 AM, around 9 AM he noted left-sided chest tightness with pain.  He was talking on the phone with his friend, did not engage in any exertional activity.  He reports associated lightheadedness with a flush sensation, felt he was about to faint.  Chest discomfort is similar to symptoms prior to his CABG, though not severe enough, and did not radiate down his left arm.  He took total 3 aspirin 325mg  tablets, and felt the pain starting to ease when he was in route to ER. He denied any shortness of breath, syncope, fever, chills, coughing, leg edema, heart palpitation or irregular heartbeat.  He felt he has been gaining weight.  He reports that his wife is fighting with cancer and had a complication from recent chemotherapy.  He admits that stress level has been high lately.  He is off his Celexa for 3 days and needs refill.   Diagnostic showed unremarkable CBC, CMP. CXR showed no acute findings.  HS trop 29 x1. Labs from yesterday ER visit showed negative flu and  COVID. UA unremarkable. Negative urine tox and blood ETOH level. EKG today showed sinus rhythm, IVCD, TWI of inferolateral leads that appears changd from EKG on 08/05/20, QT prolongation. Patient is hypertensive 153/96 at ER, otherwise stable. He was given ASA and metoprolol 25mg  x1 at ED. Subsequently cardiology is asked to see the patient.    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   LIMA to LAD LEFT RADIAL ARTERY to OM2    Unstable angina (HCC) 05/11/2019    Past Surgical History:  Procedure Laterality Date   CORONARY ARTERY BYPASS GRAFT N/A 05/14/2019   Procedure: CORONARY ARTERY BYPASS GRAFTING (CABG) x TWO USING LEFT INTERNAL MAMMARY ARTERY AND LEFT RADIAL ARTERY;  Surgeon: Kerin PernaVan Trigt, Peter, MD;  [facility];  Service: Open Heart Surgery;  Laterality: N/A;   LEFT HEART CATH AND CORONARY ANGIOGRAPHY N/A 05/13/2019   Procedure: LEFT HEART CATH AND CORONARY ANGIOGRAPHY;  Surgeon: Runell GessBerry, Jonathan J, MD;  Location: [facility] INVASIVE CV LAB;  Service: Cardiovascular;  Laterality: N/A;   RADIAL ARTERY HARVEST Left 05/14/2019   Procedure: RADIAL ARTERY HARVEST;  Surgeon: Kerin PernaVan Trigt, Peter, MD;  [facility];  Service: Open Heart Surgery;  Laterality: Left;   TEE WITHOUT CARDIOVERSION N/A 05/14/2019   Procedure: TRANSESOPHAGEAL ECHOCARDIOGRAM (TEE);  Surgeon: Donata ClayVan Trigt, Theron AristaPeter, MD;  [facility];  Service: Open Heart Surgery;  Laterality: N/A;     Medications Prior to Admission: Prior to Admission medications   Medication Sig Start Date End Date Taking? Authorizing Provider  aspirin EC 81 MG tablet Take 81 mg by mouth daily. Swallow whole.    [provider]  atorvastatin (LIPITOR) 40 MG tablet Take 1 tablet (40 mg total) by mouth daily at 6 PM. 12/11/19   Tobb, Kardie, DO  carvedilol (COREG) 6.25 MG tablet Take 1 tablet (6.25 mg total) by mouth 2 (two) times daily. 12/11/19   Tobb, Kardie, DO  citalopram (CELEXA) 20 MG tablet Take 20 mg by mouth at bedtime.  05/03/19   [provider]  ezetimibe (ZETIA) 10 MG tablet Take 1 tablet (10 mg total) by mouth daily. 12/11/19 08/06/20  Tobb, Kardie, DO  hydrochlorothiazide (MICROZIDE) 12.5 MG capsule Take 1 capsule (12.5 mg total) by mouth daily. 07/07/20 10/05/20  Tobb, Lavona MoundKardie, DO   hydrOXYzine (ATARAX/VISTARIL) 25 MG tablet Take 25 mg by mouth 3 (three) times daily. 09/14/19   [provider]  losartan (COZAAR) 50 MG tablet Take one tablet by mouth daily in the morning. Then take 1/2 tablet by mouth daily in the evening. Patient taking differently: Take one tablet by mouth daily in the morning. Then take 1 tablet by mouth daily in the evening. 12/11/19   Tobb, Lavona MoundKardie, DO     Allergies:    Allergies  Allergen Reactions   Morphine And Related Other (See Comments)    Drops BP     Social History:   Social History   Socioeconomic History   Marital status: Married    Spouse name: Not on file   Number of children: Not on file   Years of education: Not on file   Highest education  level: Not on file  Occupational History   Not on file  Tobacco Use   Smoking status: Never   Smokeless tobacco: Never  Substance and Sexual Activity   Alcohol use: Not Currently   Drug use: Never   Sexual activity: Not on file  Other Topics Concern   Not on file  Social History Narrative   Not on file   Social Determinants of Health   Financial Resource Strain: Not on file  Food Insecurity: Not on file  Transportation Needs: Not on file  Physical Activity: Not on file  Stress: Not on file  Social Connections: Not on file  Intimate Partner Violence: Not on file    Family History:   The patient's family history includes Heart attack in his brother and father; Hypertension in his mother.    ROS:  ROS:  Constitutional: see HPI  Eyes: Denied vision change or loss Ears/Nose/Mouth/Throat: Denied ear ache, sore throat, coughing, sinus pain Cardiovascular: see HPI  Respiratory: denied shortness of breath Gastrointestinal: Denied nausea, vomiting, abdominal pain, diarrhea Genital/Urinary: Denied dysuria, hematuria, urinary frequency/urgency Musculoskeletal: Denied muscle ache, joint pain, weakness Skin: Denied rash, wound Neuro: see HPI  Psych: Denid history of  depression/anxiety  Endocrine: Denied history of diabetes     Physical Exam/Data:   Vitals:   08/06/20 1400 08/06/20 1430 08/06/20 1500 08/06/20 1530  BP: 116/87 120/82 107/74 115/77  Pulse: 87 85 60 (!) 57  Resp: Temp:      TempSrc:      SpO2: 92% 97% 96% 95%  Weight:      Height:       No intake or output data in the 24 hours ending 08/06/20 1639 Last 3 Weights 08/06/2020 08/05/2020 07/07/2020  Weight (lbs) 169 lb 15.6 oz 170 lb 173 lb  Weight (kg) 77.1 kg 77.111 kg 78.472 kg     Body mass index is 25.1 kg/m.   General Appearance: In no apparent distress, laying in bed HEENT: Normocephalic, atraumatic. EOMs intact. Neck: Supple, trachea midline, no JVD Cardiovascular: Regular rate and rhythm, normal S1-S2,  no murmur/rub/gallop Respiratory: Resting breathing unlabored, lungs sounds clear to auscultation bilaterally, no use of accessory muscles. On room air.  No wheezes, rales or rhonchi.   Gastrointestinal: Bowel sounds positive, abdomen soft, non-tender, non-distended. Extremities: Able to move all extremities in bed without difficulty, no edema/cyanosis/clubbing Genitourinary: genital exam not performed Musculoskeletal: Normal muscle bulk and tone, muscle strength 5/5 throughout Skin: Intact, warm, dry. No rashes or petechiae noted in exposed areas.  Neurologic: Alert, oriented to person, place and time. Fluent speech, no cognitive deficit,  no gross focal neuro deficit Psychiatric: Mild anxiety     EKG:  The EKG was personally reviewed and demonstrates:     EKG today showed sinus rhythm, IVCD, TWI of inferolateral leads that appears changed from EKG on 08/05/20, QT prolongation.    Telemetry:  Telemetry was personally reviewed and demonstrates:  Sinus rhythm with rate of 80s, noted fair frequent PACs and PVCs  Relevant CV Studies:  Left heart cath from 05/13/19:     IMPRESSION: Mr. Lenker has left dominant system with severe calcified vessels.  His  entire proximal third of his LAD is calcified with diffuse 50 to 60% stenosis and 90% stenosis within this.  He had a high-grade small to medium size second marginal branch stenosis and a high-grade large ostial proximal and mid third obtuse marginal branch stenosis with normal LV function.  I believe  the best revascularization strategy would be complete revascularization.  He is not diabetic would be a good candidate for all all arterial conduit procedure if possible.  The sheath was removed and a TR band was placed on the right wrist to achieve patent hemostasis.  The patient left lab in stable condition.  He will be reheparinized 4 hours after sheath removal.  T CTS has been notified.       Laboratory Data:  High Sensitivity Troponin:   Recent Labs  Lab 08/06/20 1211 08/06/20 1450  TROPONINIHS 29* 30*      Chemistry Recent Labs  Lab 08/05/20 1255 08/06/20 1211  NA 137 140  K 3.7 3.7  CL 102 104  CO2 27 27  GLUCOSE 112* 120*  BUN 15 15  CREATININE 0.92 0.89  CALCIUM 9.1 9.6  GFRNONAA >60 >60  ANIONGAP 8 9    Recent Labs  Lab 08/05/20 1255 08/06/20 1211  PROT 6.9 7.0  ALBUMIN 4.0 4.0  AST 28 27  ALT 31 30  ALKPHOS 45 47  BILITOT 0.9 0.8   Hematology Recent Labs  Lab 08/05/20 1106 08/05/20 1133 08/06/20 1211  WBC 10.4  --  9.7  RBC 4.59  --  4.60  HGB 15.0 15.0 15.0  HCT 43.5 44.0 43.0  MCV 94.8  --  93.5  MCH 32.7  --  32.6  MCHC 34.5  --  34.9  RDW 11.9  --  11.9  PLT 248  --  245   BNPNo results for input(s): BNP, PROBNP in the last 168 hours.  DDimer No results for input(s): DDIMER in the last 168 hours.   Radiology/Studies:  MR BRAIN WO CONTRAST  Result Date: 08/05/2020 CLINICAL DATA:  Neuro deficit, acute stroke suspected. EXAM: MRI HEAD WITHOUT CONTRAST TECHNIQUE: Multiplanar, multiecho pulse sequences of the brain and surrounding structures were obtained without intravenous contrast. COMPARISON:  Same day CT head.  MRI May 07, 2019. FINDINGS:  Brain: No acute infarction, hemorrhage, hydrocephalus, extra-axial collection or mass lesion. No substantial change in overall mild scattered T2/FLAIR hyperintensities within the white matter, most conspicuous in the left frontal lobe. Mildly prominent retro cerebellar CSF, similar to prior. Vascular: Major arterial flow voids are maintained skull base. Skull and upper cervical spine: Normal marrow signal. Sinuses/Orbits: Mild paranasal sinus mucosal thickening. Unremarkable orbits. Other: No mastoid effusions. IMPRESSION: 1. No evidence of acute intracranial abnormality. Specifically, no acute infarct. 2. No substantial change in overall mild scattered T2/FLAIR hyperintensities within the white matter, which are nonspecific but most likely related to chronic microvascular ischemic disease. Electronically Signed   By: Feliberto Harts MD   On: 08/05/2020 19:35   DG Chest Portable 1 View  Result Date: 08/06/2020 CLINICAL DATA:  Chest tightness and dizziness EXAM: PORTABLE CHEST 1 VIEW COMPARISON:  None. FINDINGS: Cardiac and mediastinal contours are unchanged status post median sternotomy. Unchanged mild tortuosity of the thoracic aorta. Lungs are clear.  No large pleural effusion or pneumothorax. IMPRESSION: Clear lungs. Electronically Signed   By: Allegra Lai MD   On: 08/06/2020 13:10     Assessment and Plan:     Chest pain Elevated trop  QT prolongation  CAD with hx of CABGX2 in 05/13/2019 - presented with chest tighness and pressure with associated lightheadedness for 2 hours - EKG with new TWI of inferolateral leads and QT prolongation - Hs trop 29 >30 - ACS is ruled out, currently chest pain-free - Etiology include anxiety versus Takotsubo CM versus SSRI withdrawal versus  coronary spasm  - will admit to telemetry unit  - Continue medical therapy, carvedilol, losartan, Lipitor, zetia , will prescribe PRN nitro - will obtain Echo to evaluate LV  - will arrange stress Myoveiw tomorrow ,  NPO at midnight  - repeat EKG tomorrow    HTN - BP improved after receiving metoprolol at ED - resume home regimen with HCTZ, Coreg, and losartan; may switch HCTZ to amlodipine if needed    HLD - LDL 66 from 07/10/20, at goal, continue zetia and lipitor    Anxiety  SSRI abrupt discontinuation  - on Celexa historically, medication ran out for 3 days, needs refill - given current QT prolongation, unable resume,  will provide PRN Xanax for now    Hx of post-op A fib - occurred after CABG in 2021, no longer on amiodarone and had no recurrence so far      Risk Assessment/Risk Scores:    HEAR Score (for undifferentiated chest pain):  HEAR Score: 6{     Severity of Illness: The appropriate patient status for this patient is OBSERVATION. Observation status is judged to be reasonable and necessary in order to provide the required intensity of service to ensure the patient's safety. The patient's presenting symptoms, physical exam findings, and initial radiographic and laboratory data in the context of their medical condition is felt to place them at decreased risk for further clinical deterioration. Furthermore, it is anticipated that the patient will be medically stable for discharge from the hospital within 2 midnights of admission. The following factors support the patient status of observation.   " The patient's presenting symptoms include chest pain " The physical exam findings include N/A. " The initial radiographic and laboratory data are elevated trop.   For questions or updates, please contact [facility] Please consult [email or web address] for contact info under     Signed, Cyndi Bender, NP  08/06/2020 4:39 PM    I have seen and examined the patient along with Cyndi Bender, NP .  I have reviewed the chart, notes and new data.  I agree with PA/NP's note.  Key new complaints: had strange neurological complaints , then symptoms c/w previous angina, within 48-72 hours of emotional distress  related to wife's illness and abrupt cessation of citalopram. No asymptomatic. Key examination changes: normal CV exam, harvested L radial Key new findings / data: ECG with striking symmetrical T wave inversions in precordial and inferior leads and remarkable prolongation of QTc (451 ms yesterday, 541 ms today). Minimal abnormality in hsTnI. He does have a minor IVCD (QRS 120 ms, but narrow intrinsicoid deflection, not true LBBB).  PLAN: Doubt conventional atherothrombotic acute coronary sd. Differential diagnosis is stress cardiomyopathy (takotsubo sd.) versus LIMA or radial artery graft spasm. Continue to hold citalopram and other QT prolonging drugs until QTc  improves. Carvedilol good choice for beta blocker if spasm prone. Consider replacing HCTZ with amlodipine.  Thurmon Fair, MD, [facility] [phone] 08/06/2020, 5:11 PM

## 2020-08-07 ENCOUNTER — Observation Stay (HOSPITAL_BASED_OUTPATIENT_CLINIC_OR_DEPARTMENT_OTHER): Payer: Medicare Other

## 2020-08-07 DIAGNOSIS — R9431 Abnormal electrocardiogram [ECG] [EKG]: Secondary | ICD-10-CM

## 2020-08-07 DIAGNOSIS — R079 Chest pain, unspecified: Secondary | ICD-10-CM

## 2020-08-07 DIAGNOSIS — I200 Unstable angina: Secondary | ICD-10-CM | POA: Diagnosis not present

## 2020-08-07 DIAGNOSIS — I4581 Long QT syndrome: Secondary | ICD-10-CM | POA: Diagnosis not present

## 2020-08-07 DIAGNOSIS — I480 Paroxysmal atrial fibrillation: Secondary | ICD-10-CM | POA: Diagnosis not present

## 2020-08-07 DIAGNOSIS — R0789 Other chest pain: Secondary | ICD-10-CM | POA: Diagnosis not present

## 2020-08-07 DIAGNOSIS — I10 Essential (primary) hypertension: Secondary | ICD-10-CM | POA: Diagnosis not present

## 2020-08-07 DIAGNOSIS — I25700 Atherosclerosis of coronary artery bypass graft(s), unspecified, with unstable angina pectoris: Secondary | ICD-10-CM | POA: Diagnosis not present

## 2020-08-07 LAB — BASIC METABOLIC PANEL
Anion gap: 8 (ref 5–15)
BUN: 12 mg/dL (ref 8–23)
CO2: 30 mmol/L (ref 22–32)
Calcium: 9.3 mg/dL (ref 8.9–10.3)
Chloride: 100 mmol/L (ref 98–111)
Creatinine, Ser: 0.93 mg/dL (ref 0.61–1.24)
GFR, Estimated: >60 mL/min (ref >60)
Glucose, Bld: 130 mg/dL — ABNORMAL HIGH (ref 70–99)
Potassium: 4.3 mmol/L (ref 3.5–5.1)
Sodium: 138 mmol/L (ref 135–145)

## 2020-08-07 LAB — NM MYOCAR MULTI W/SPECT W/WALL MOTION / EF
Estimated workload: 1 METS
Exercise duration (min): 0 min
Exercise duration (sec): 0 s
LV dias vol: 72 mL (ref 62–150)
LV sys vol: 32 mL
MPHR: 155 {beats}/min
Peak HR: 97 {beats}/min
Percent HR: 62 %
Rest HR: 60 {beats}/min
TID: 0.9

## 2020-08-07 LAB — ECHOCARDIOGRAM COMPLETE
Area-P 1/2: 3.23 cm2
Calc EF: 51.3 %
Height: 69 in
S' Lateral: 4.1 cm
Single Plane A2C EF: 60.1 %
Single Plane A4C EF: 41.4 %
Weight: 5876.58 oz

## 2020-08-07 LAB — HIV ANTIBODY (ROUTINE TESTING W REFLEX): HIV Screen 4th Generation wRfx: NONREACTIVE

## 2020-08-07 MED ORDER — AMLODIPINE BESYLATE 5 MG PO TABS
5.0000 mg | ORAL_TABLET | Freq: Every day | ORAL | Status: DC
  Administered 2020-08-07: 5 mg via ORAL
  Filled 2020-08-07: qty 1

## 2020-08-07 MED ORDER — TECHNETIUM TC 99M TETROFOSMIN IV KIT
30.3000 | PACK | Freq: Once | INTRAVENOUS | Status: AC | PRN
  Administered 2020-08-07: 30.3 via INTRAVENOUS

## 2020-08-07 MED ORDER — REGADENOSON 0.4 MG/5ML IV SOLN
INTRAVENOUS | Status: AC
  Filled 2020-08-07: qty 5

## 2020-08-07 MED ORDER — REGADENOSON 0.4 MG/5ML IV SOLN
0.4000 mg | Freq: Once | INTRAVENOUS | Status: AC
  Administered 2020-08-07: 0.4 mg via INTRAVENOUS
  Filled 2020-08-07: qty 5

## 2020-08-07 MED ORDER — TECHNETIUM TC 99M TETROFOSMIN IV KIT
10.1000 | PACK | Freq: Once | INTRAVENOUS | Status: AC | PRN
  Administered 2020-08-07: 10.1 via INTRAVENOUS

## 2020-08-07 MED ORDER — PERFLUTREN LIPID MICROSPHERE
1.0000 mL | INTRAVENOUS | Status: AC | PRN
  Administered 2020-08-07: 2 mL via INTRAVENOUS
  Filled 2020-08-07: qty 10

## 2020-08-07 MED ORDER — AMLODIPINE BESYLATE 5 MG PO TABS: 5.0000 mg | ORAL_TABLET | Freq: Every day | ORAL | 2 refills | Status: DC

## 2020-08-07 MED ORDER — NITROGLYCERIN 0.4 MG SL SUBL: 0.4000 mg | SUBLINGUAL_TABLET | SUBLINGUAL | 1 refills | Status: AC | PRN

## 2020-08-07 NOTE — Progress Notes (Signed)
   [place] presented for a nuclear stress test today.  No immediate complications.  Stress imaging is pending at this time.  Preliminary EKG findings may be listed in the chart, but the stress test result will not be finalized until perfusion imaging is complete.  One day study.  Beatriz Stallion, PA-C 08/07/2020, 11:55 AM

## 2020-08-07 NOTE — Discharge Summary (Signed)
Discharge Summary    Patient ID: Juan Payne MRN: [id number]; DOB: Jul 21, 1955  Admit date: 08/06/2020 Discharge date: 08/07/2020  PCP:  Street, Stephanie Coup, MD   [facility] Providers Cardiologist:  Juan Ripple, DO   {    Discharge Diagnoses    Active Problems:   Chest pain   Essential hypertension   Coronary artery disease involving native heart without angina pectoris   QT prolongation    Diagnostic Studies/Procedures   NM stress Myoview from 08/07/20:  Pending report   Echo from 08/07/20:   1. Hypokinesis of the mid to apical posterolateral myocardium. Left  ventricular ejection fraction, by estimation, is 45 to 50%. The left  ventricle has mildly decreased function. The left ventricle demonstrates  regional wall motion abnormalities (see scoring diagram/findings for description). There is mild asymmetric left ventricular hypertrophy of the basal-septal segment. Left ventricular  diastolic parameters are consistent with Grade I diastolic dysfunction  (impaired relaxation).   2. Right ventricular systolic function is normal. The right ventricular  size is normal. There is normal pulmonary artery systolic pressure.   3. The mitral valve is normal in structure. Trivial mitral valve  regurgitation. No evidence of mitral stenosis.   4. The aortic valve is tricuspid. Aortic valve regurgitation is trivial.  No aortic stenosis is present.   5. Aortic dilatation noted. There is borderline dilatation of the  ascending aorta, measuring 36 mm.   6. The inferior vena cava is normal in size with greater than 50%  respiratory variability, suggesting right atrial pressure of 3 mmHg.  _____________   History of Present Illness     Juan Payne follows Juan Payne for CAD, had hx of chest pain in 04/2019, where he visited ER and had a positive NM stress test. He was transferred to [facility] and underwent cardiac cath on 05/13/19 which showed proximal third of LAD with diffuse stenosis  and high grade small to medium size second marginal branch stenosis and  a high-grade large ostial proximal and mid third obtuse marginal branch stenosis with normal LV function. He was recommended CABG, which was done by Juan Zenaida Niece on 05/13/19 with LIMA to LAD and left radial artery free graft to circumflex marginal. He did developed post-op A fib, which he was placed on amiodarone, anticoagulation was not used.   He was seen in the office follow up, had c/o od dizziness, stopped on HCTZ-losartan and placed on losartan  BID only on 06/2019 visit. He was taken off amiodarone sometime in 2021. He was last by Juan Payne in the office on 07/07/20, was doing well, BP was mildly elevated, he was resumed on low dose HCTZ 12.5mg  daily while continued on Coreg and losartan. He is maintain on ASA and Lipitor for medical therapy.   He was in the ER 08/05/20 for confusion, was seen by neurology, had impaired short term memory on exam, CTH and MRI of brain negative for acute findings. EEG was normal and showed no seizure or epileptiform discharges. Presentation was felt due to transient global amnesia. He was released home.   Patient presented to ER 08/06/20 via EMS, c/o chest tightness, dizziness at home.  Patient states he has been experiencing intermittent chest tightness briefly for a while, never mentioned to his cardiologist as its not bothering him much.  He woke up around 7 AM, around 9 AM he noted left-sided chest tightness with pain.  He was talking on the phone with his friend, did not engage in any exertional  activity.  He reported associated lightheadedness with a flush sensation, felt he was about to faint.  Chest discomfort was similar to symptoms prior to his CABG, though not severe enough, and did not radiate down his left arm.  He took total 3 aspirin 325mg  tablets, and felt the pain starting to ease when he was in route to ER. He denied any shortness of breath, syncope, fever, chills, coughing, leg edema, heart  palpitation or irregular heartbeat.  He felt he has been gaining weight.  He reports that his wife is fighting with cancer and had a complication from recent chemotherapy.  He admited that stress level has been high lately.  He was off his Celexa for 3 days and needs refill.   Diagnostic showed unremarkable CBC, CMP. CXR showed no acute findings.  HS trop 29 x1. Labs from yesterday ER visit showed negative flu and COVID. UA unremarkable. Negative urine tox and blood ETOH level. EKG intially showed sinus rhythm, IVCD, TWI of inferolateral leads that appears changd from EKG on 08/05/20, QT prolongation. Patient is hypertensive 153/96 at ER, otherwise stable. He was given ASA and metoprolol 25mg  x1 at ED. Subsequently cardiology is asked to see the patient.     Hospital Course     Consultants: N/A   Chest pain Elevated trop  QT prolongation  CAD with hx of CABGX2 in 05/13/2019 - presented with chest tighness and pressure with associated lightheadedness for 2 hours - EKG with new TWI of inferolateral leads and QT prolongation - Hs trop 29 >30 - ACS ruled out  - Etiology include anxiety versus Takotsubo CM versus SSRI withdrawal versus coronary spasm  - Repeat EKG today with persistent TWI of inferolateral leads, improved QT prolongation - Echo today showed EF 45-50%, LV mildly decreased function, hypokinesis of mid to apical posterolateral myocardium, mild asymmetric LVH of the basal-septal segment, grade I DD, trivial MR and AR, borderline dilatation of the ascending aorta, measuring 36 mm.  - Stress Myoveiw negative (reviewed with MD, report is pending at this time) - Continue medical therapy with ASA, carvedilol, losartan, Lipitor, zetia, PRN nitro; changed HCTZ to amlodipine this admission for potential spasm  - Follow up with cardiology arranged on 08/25/20 at 10AM with Juan Servando Salinaobb   HTN - BP controlled  - resume home regimen with Coreg, and losartan; switched HCTZ to amlodipine    HLD - LDL 66  from 07/10/20, at goal, continue zetia and lipitor    Anxiety  SSRI abrupt discontinuation  - on Celexa historically, medication ran out for 3 days at admission, needs refill - held at admission due to QT prolongation, repeat EKG today with improved QT prolongation, discussed with MD, OK to resume Celexa, if recurrence, will need alternative agent for anxiety  - will stop hydroxyzine   Hx of post-op A fib - occurred after CABG in 2021, no longer on amiodarone and had no recurrence so far     Did the patient have an acute coronary syndrome (MI, NSTEMI, STEMI, etc) this admission?:  No                               Did the patient have a percutaneous coronary intervention (stent / angioplasty)?:  No.       _____________  Discharge Vitals Blood pressure (!) 145/94, pulse 64, temperature 98.5 F (36.9 C), temperature source Oral, resp. rate 15, height 5\' 9"  (1.753 m), weight (!) 166.6  kg, SpO2 99 %.  Filed Weights   08/06/20 1152 08/06/20 1958  Weight: 77.1 kg (!) 166.6 kg    Labs & Radiologic Studies    CBC Recent Labs    08/05/20 1106 08/05/20 1133 08/06/20 1211  WBC 10.4  --  9.7  NEUTROABS 7.4  --   --   HGB 15.0 15.0 15.0  HCT 43.5 44.0 43.0  MCV 94.8  --  93.5  PLT 248  --  245   Basic Metabolic Panel Recent Labs    34/19/62 1211 08/07/20 0102  NA 140 138  K 3.7 4.3  CL 104 100  CO2 27 30  GLUCOSE 120* 130*  BUN 15 12  CREATININE 0.89 0.93  CALCIUM 9.6 9.3   Liver Function Tests Recent Labs    08/05/20 1255 08/06/20 1211  AST 28 27  ALT 31 30  ALKPHOS 45 47  BILITOT 0.9 0.8  PROT 6.9 7.0  ALBUMIN 4.0 4.0   No results for input(s): LIPASE, AMYLASE in the last 72 hours. High Sensitivity Troponin:   Recent Labs  Lab 08/06/20 1211 08/06/20 1450  TROPONINIHS 29* 30*    BNP Invalid input(s): POCBNP D-Dimer No results for input(s): DDIMER in the last 72 hours. Hemoglobin A1C No results for input(s): HGBA1C in the last 72 hours. Fasting Lipid  Panel No results for input(s): CHOL, HDL, LDLCALC, TRIG, CHOLHDL, LDLDIRECT in the last 72 hours. Thyroid Function Tests No results for input(s): TSH, T4TOTAL, T3FREE, THYROIDAB in the last 72 hours.  Invalid input(s): FREET3 _____________  CT HEAD WO CONTRAST  Result Date: 08/05/2020 CLINICAL DATA:  Neuro deficit, acute, stroke suspected EXAM: CT HEAD WITHOUT CONTRAST TECHNIQUE: Contiguous axial images were obtained from the base of the skull through the vertex without intravenous contrast. COMPARISON:  Head CT 09/06/2019 report, images not retrievable at the time of this exam FINDINGS: Brain: No evidence of acute intracranial hemorrhage. Left frontal lobe white matter hypodensities, similar in description to prior CT report in August 2021. There are no new areas of focal hypoattenuation or loss of gray-white matter differentiation. There is no extra-axial collection. Vascular: No hyperdense vessel or unexpected calcification. Skull: Normal. Negative for fracture or focal lesion. Sinuses/Orbits: Scattered paranasal sinus mucosal thickening. Other: None. IMPRESSION: No acute intracranial abnormality. Left frontal white matter hypodensities, likely representing chronic small vessel ischemic disease. If there is concern for acute infarct, recommend MRI. Electronically Signed   By: Caprice Renshaw   On: 08/05/2020 14:10   MR BRAIN WO CONTRAST  Result Date: 08/05/2020 CLINICAL DATA:  Neuro deficit, acute stroke suspected. EXAM: MRI HEAD WITHOUT CONTRAST TECHNIQUE: Multiplanar, multiecho pulse sequences of the brain and surrounding structures were obtained without intravenous contrast. COMPARISON:  Same day CT head.  MRI May 07, 2019. FINDINGS: Brain: No acute infarction, hemorrhage, hydrocephalus, extra-axial collection or mass lesion. No substantial change in overall mild scattered T2/FLAIR hyperintensities within the white matter, most conspicuous in the left frontal lobe. Mildly prominent retro cerebellar  CSF, similar to prior. Vascular: Major arterial flow voids are maintained skull base. Skull and upper cervical spine: Normal marrow signal. Sinuses/Orbits: Mild paranasal sinus mucosal thickening. Unremarkable orbits. Other: No mastoid effusions. IMPRESSION: 1. No evidence of acute intracranial abnormality. Specifically, no acute infarct. 2. No substantial change in overall mild scattered T2/FLAIR hyperintensities within the white matter, which are nonspecific but most likely related to chronic microvascular ischemic disease. Electronically Signed   By: Feliberto Harts MD   On: 08/05/2020 19:35  DG Chest Portable 1 View  Result Date: 08/06/2020 CLINICAL DATA:  Chest tightness and dizziness EXAM: PORTABLE CHEST 1 VIEW COMPARISON:  None. FINDINGS: Cardiac and mediastinal contours are unchanged status post median sternotomy. Unchanged mild tortuosity of the thoracic aorta. Lungs are clear.  No large pleural effusion or pneumothorax. IMPRESSION: Clear lungs. Electronically Signed   By: Allegra Lai MD   On: 08/06/2020 13:10   EEG adult  Result Date: 08/05/2020 Charlsie Quest, MD     08/05/2020  8:09 PM Patient Name: Juan Payne MRN: [id number] Epilepsy Attending: Charlsie Quest Referring Physician/Provider: Dr Arby Barrette Date: 08/05/2020 Duration: 22.51 mins Patient history: 65yo M with ams. EEG to evaluate for seizure Level of alertness: Awake AEDs during EEG study: None Technical aspects: This EEG study was done with scalp electrodes positioned according to the 10-20 International system of electrode placement. Electrical activity was acquired at a sampling rate of  and reviewed with a high frequency filter of  and a low frequency filter of . EEG data were recorded continuously and digitally stored. Description: The posterior dominant rhythm consists of 9-10 Hz activity of moderate voltage (25-35 uV) seen predominantly in posterior head regions, symmetric and reactive to eye  opening and eye closing. Hyperventilation and photic stimulation were not performed.   IMPRESSION: This study is within normal limits. No seizures or epileptiform discharges were seen throughout the recording. Charlsie Quest   ECHOCARDIOGRAM COMPLETE  Result Date: 08/07/2020    ECHOCARDIOGRAM REPORT   Patient Name:   Juan Payne Date of Exam: 08/07/2020 Medical Rec #:  [id number]       Height:       69.0 in Accession #:    [id number]      Weight:       367.3 lb Date of Birth:  07-30-1955      BSA:          2.675 m Patient Age:    64 years        BP:           130/85 mmHg Patient Gender: M               HR:           59 bpm. Exam Location:  Inpatient Procedure: 2D Echo, Cardiac Doppler, Color Doppler and Intracardiac            Opacification Agent Indications:    Chest Pain R07.9  History:        Patient has prior history of Echocardiogram examinations, most                 recent 05/13/2019. CAD, Prior CABG; Risk Factors:Hypertension and                 Dyslipidemia.  Sonographer:    Leta Jungling RDCS Referring Phys: [id number] Cyndi Bender IMPRESSIONS  1. Hypokinesis of the mid to apical posterolateral myocardium. Left ventricular ejection fraction, by estimation, is 45 to 50%. The left ventricle has mildly decreased function. The left ventricle demonstrates regional wall motion abnormalities (see scoring diagram/findings for description). There is mild asymmetric left ventricular hypertrophy of the basal-septal segment. Left ventricular diastolic parameters are consistent with Grade I diastolic dysfunction (impaired relaxation).  2. Right ventricular systolic function is normal. The right ventricular size is normal. There is normal pulmonary artery systolic pressure.  3. The mitral valve is normal in structure. Trivial mitral valve regurgitation. No evidence of mitral stenosis.  4.  The aortic valve is tricuspid. Aortic valve regurgitation is trivial. No aortic stenosis is present.  5. Aortic dilatation noted.  There is borderline dilatation of the ascending aorta, measuring 36 mm.  6. The inferior vena cava is normal in size with greater than 50% respiratory variability, suggesting right atrial pressure of 3 mmHg. FINDINGS  Left Ventricle: Hypokinesis of the mid to apical posterolateral myocardium. Left ventricular ejection fraction, by estimation, is 45 to 50%. The left ventricle has mildly decreased function. The left ventricle demonstrates regional wall motion abnormalities. Definity contrast agent was given IV to delineate the left ventricular endocardial borders. The left ventricular internal cavity size was normal in size. There is mild asymmetric left ventricular hypertrophy of the basal-septal segment. Left ventricular diastolic parameters are consistent with Grade I diastolic dysfunction (impaired relaxation). Right Ventricle: The right ventricular size is normal. No increase in right ventricular wall thickness. Right ventricular systolic function is normal. There is normal pulmonary artery systolic pressure. The tricuspid regurgitant velocity is 2.43 m/s, and  with an assumed right atrial pressure of 3 mmHg, the estimated right ventricular systolic pressure is 26.6 mmHg. Left Atrium: Left atrial size was normal in size. Right Atrium: Right atrial size was normal in size. Pericardium: There is no evidence of pericardial effusion. Mitral Valve: The mitral valve is normal in structure. Trivial mitral valve regurgitation. No evidence of mitral valve stenosis. Tricuspid Valve: The tricuspid valve is normal in structure. Tricuspid valve regurgitation is trivial. No evidence of tricuspid stenosis. Aortic Valve: The aortic valve is tricuspid. Aortic valve regurgitation is trivial. No aortic stenosis is present. Pulmonic Valve: The pulmonic valve was normal in structure. Pulmonic valve regurgitation is not visualized. No evidence of pulmonic stenosis. Aorta: Aortic dilatation noted. There is borderline dilatation of the  ascending aorta, measuring 36 mm. Venous: The inferior vena cava is normal in size with greater than 50% respiratory variability, suggesting right atrial pressure of 3 mmHg. IAS/Shunts: No atrial level shunt detected by color flow Doppler.  LEFT VENTRICLE PLAX 2D LVIDd:         4.90 cm      Diastology LVIDs:         4.10 cm      LV e' medial:    8.16 cm/s LV PW:         0.90 cm      LV E/e' medial:  6.4 LV IVS:        1.10 cm      LV e' lateral:   9.65 cm/s LVOT diam:     2.20 cm      LV E/e' lateral: 5.4 LV SV:         60 LV SV Index:   22 LVOT Area:     3.80 cm  LV Volumes (MOD) LV vol d, MOD A2C: 114.0 ml LV vol d, MOD A4C: 104.0 ml LV vol s, MOD A2C: 45.5 ml LV vol s, MOD A4C: 60.9 ml LV SV MOD A2C:     68.5 ml LV SV MOD A4C:     104.0 ml LV SV MOD BP:      56.8 ml RIGHT VENTRICLE RV S prime:     10.40 cm/s LEFT ATRIUM             Index LA diam:        4.10 cm 1.53 cm/m LA Vol (A2C):   29.3 ml 10.95 ml/m LA Vol (A4C):   32.8 ml 12.26 ml/m LA Biplane Vol: 31.0  ml 11.59 ml/m  AORTIC VALVE LVOT Vmax:   71.45 cm/s LVOT Vmean:  53.000 cm/s LVOT VTI:    0.158 m  AORTA Ao Root diam: 3.50 cm Ao Asc diam:  3.60 cm MITRAL VALVE               TRICUSPID VALVE MV Area (PHT): 3.23 cm    TR Peak grad:   23.6 mmHg MV Decel Time: 235 msec    TR Vmax:        243.00 cm/s MV E velocity: 52.15 cm/s MV A velocity: 78.40 cm/s  SHUNTS MV E/A ratio:  0.67        Systemic VTI:  0.16 m                            Systemic Diam: 2.20 cm Chilton Si MD Electronically signed by Chilton Si MD Signature Date/Time: 08/07/2020/1:17:11 PM    Final    Disposition   Patient was seen again by MD, result of Echo and Myoveiw provided. Pt is being discharged home today in good condition.  Follow-up Plans & Appointments     Follow-up Information     Tobb, Lavona Mound, DO Follow up on 08/25/2020.   Specialty: Cardiology Why: please arrive 15 min before 10AM for your post hospital follow up appointment with cardiology [facility]  location) Contact information: [address] [place] [address] [phone]                Discharge Instructions     Diet - low sodium heart healthy   Complete by: As directed    Discharge instructions   Complete by: As directed    Follow up with your cardiologist Juan Payne on 08/25/20 at [facility] office at 10AM   Increase activity slowly   Complete by: As directed        Discharge Medications   Allergies as of 08/07/2020       Reactions   Morphine And Related Other (See Comments)   Drops BP         Medication List     STOP taking these medications    hydrochlorothiazide 12.5 MG capsule Commonly known as: MICROZIDE   hydrOXYzine 25 MG tablet Commonly known as: ATARAX/VISTARIL       TAKE these medications    amLODipine 5 MG tablet Commonly known as: NORVASC Take 1 tablet (5 mg total) by mouth daily. Start taking on: August 08, 2020   aspirin EC 81 MG tablet Take 81 mg by mouth daily. Swallow whole.   atorvastatin 40 MG tablet Commonly known as: LIPITOR Take 1 tablet (40 mg total) by mouth daily at 6 PM.   carvedilol 6.25 MG tablet Commonly known as: COREG Take 1 tablet (6.25 mg total) by mouth 2 (two) times daily.   citalopram 20 MG tablet Commonly known as: CELEXA Take 20 mg by mouth at bedtime.   ezetimibe 10 MG tablet Commonly known as: ZETIA Take 1 tablet (10 mg total) by mouth daily.   losartan 50 MG tablet Commonly known as: COZAAR Take one tablet by mouth daily in the morning. Then take 1/2 tablet by mouth daily in the evening. What changed: additional instructions   nitroGLYCERIN 0.4 MG SL tablet Commonly known as: NITROSTAT Place 1 tablet (0.4 mg total) under the tongue every 5 (five) minutes as needed for chest pain.  Outstanding Labs/Studies     Duration of Discharge Encounter   Greater than 30 minutes including physician time.  Signed, Cyndi Bender, NP 08/07/2020, 2:52 PM

## 2020-08-07 NOTE — Progress Notes (Addendum)
Progress Note  Patient Name: Juan Payne Date of Encounter: 08/07/2020  [facility] Cardiologist: Thomasene Ripple, DO   Subjective   Patient states he is feeling overall improved, he had a mild sensation of " flushed" this morning, but not nearly as bad as yesterday.  He denies any chest pain.  He is agreeable with echocardiogram and stress test today.   Inpatient Medications    Scheduled Meds:  aspirin EC  81 mg Oral Daily   atorvastatin  40 mg Oral Daily   carvedilol  6.25 mg Oral BID WC   ezetimibe  10 mg Oral Daily   heparin  5,000 Units Subcutaneous Q8H   hydrochlorothiazide  12.5 mg Oral Daily   losartan  25 mg Oral QHS   losartan  50 mg Oral Daily   sodium chloride flush  3 mL Intravenous Q12H   Continuous Infusions:  PRN Meds: ALPRAZolam, nitroGLYCERIN   Vital Signs    Vitals:   08/06/20 1800 08/06/20 1830 08/06/20 1958 08/07/20 0432  BP: 123/80 131/89 116/89 122/79  Pulse: (!) 57 63 63 64  Resp: 15 13 18 15   Temp:  98.1 F (36.7 C) 97.7 F (36.5 C) 98 F (36.7 C)  TempSrc:  Oral Axillary Oral  SpO2: 96% 94%    Weight:   (!) 166.6 kg   Height:   5\' 9"  (1.753 m)     Intake/Output Summary (Last 24 hours) at 08/07/2020 0732 Last data filed at 08/06/2020 2200 Gross per 24 hour  Intake 120 ml  Output --  Net 120 ml   Last 3 Weights 08/06/2020 08/06/2020 08/05/2020  Weight (lbs) 367 lb 4.6 oz 169 lb 15.6 oz 170 lb  Weight (kg) 166.6 kg 77.1 kg 77.111 kg      Telemetry    Sinus bradycardia/rhythm 49-60s, PACs- Personally Reviewed  ECG    Sinus rhythm with occasional PVCs, ventricular rate of 63, continued T wave inversion of inferiolateral leads, improved QT prolongation with calculated QT 451 -personally reviewed  Physical Exam   GEN: No acute distress.   Neck: No JVD Cardiac: RRR, no murmurs, rubs, or gallops.  Respiratory: Clear to auscultation bilaterally.  On room air, no wheezing GI: Soft, nontender, non-distended  MS: No bilateral  lower extremity edema; No deformity. Neuro:  Nonfocal  Psych: Normal affect   Labs    High Sensitivity Troponin:   Recent Labs  Lab 08/06/20 1211 08/06/20 1450  TROPONINIHS 29* 30*      Chemistry Recent Labs  Lab 08/05/20 1255 08/06/20 1211 08/07/20 0102  NA 137 140 138  K 3.7 3.7 4.3  CL 102 104 100  CO2 27 27 30   GLUCOSE 112* 120* 130*  BUN 15 15 12   CREATININE 0.92 0.89 0.93  CALCIUM 9.1 9.6 9.3  PROT 6.9 7.0  --   ALBUMIN 4.0 4.0  --   AST 28 27  --   ALT 31 30  --   ALKPHOS 45 47  --   BILITOT 0.9 0.8  --   GFRNONAA >60 >60 >60  ANIONGAP 8 9 8      Hematology Recent Labs  Lab 08/05/20 1106 08/05/20 1133 08/06/20 1211  WBC 10.4  --  9.7  RBC 4.59  --  4.60  HGB 15.0 15.0 15.0  HCT 43.5 44.0 43.0  MCV 94.8  --  93.5  MCH 32.7  --  32.6  MCHC 34.5  --  34.9  RDW 11.9  --  11.9  PLT  248  --  245    BNP Recent Labs  Lab 08/06/20 1211  BNP 177.7*     DDimer No results for input(s): DDIMER in the last 168 hours.   Radiology    CT HEAD WO CONTRAST  Result Date: 08/05/2020 CLINICAL DATA:  Neuro deficit, acute, stroke suspected EXAM: CT HEAD WITHOUT CONTRAST TECHNIQUE: Contiguous axial images were obtained from the base of the skull through the vertex without intravenous contrast. COMPARISON:  Head CT 09/06/2019 report, images not retrievable at the time of this exam FINDINGS: Brain: No evidence of acute intracranial hemorrhage. Left frontal lobe white matter hypodensities, similar in description to prior CT report in August 2021. There are no new areas of focal hypoattenuation or loss of gray-white matter differentiation. There is no extra-axial collection. Vascular: No hyperdense vessel or unexpected calcification. Skull: Normal. Negative for fracture or focal lesion. Sinuses/Orbits: Scattered paranasal sinus mucosal thickening. Other: None. IMPRESSION: No acute intracranial abnormality. Left frontal white matter hypodensities, likely representing  chronic small vessel ischemic disease. If there is concern for acute infarct, recommend MRI. Electronically Signed   By: Caprice Renshaw   On: 08/05/2020 14:10   MR BRAIN WO CONTRAST  Result Date: 08/05/2020 CLINICAL DATA:  Neuro deficit, acute stroke suspected. EXAM: MRI HEAD WITHOUT CONTRAST TECHNIQUE: Multiplanar, multiecho pulse sequences of the brain and surrounding structures were obtained without intravenous contrast. COMPARISON:  Same day CT head.  MRI May 07, 2019. FINDINGS: Brain: No acute infarction, hemorrhage, hydrocephalus, extra-axial collection or mass lesion. No substantial change in overall mild scattered T2/FLAIR hyperintensities within the white matter, most conspicuous in the left frontal lobe. Mildly prominent retro cerebellar CSF, similar to prior. Vascular: Major arterial flow voids are maintained skull base. Skull and upper cervical spine: Normal marrow signal. Sinuses/Orbits: Mild paranasal sinus mucosal thickening. Unremarkable orbits. Other: No mastoid effusions. IMPRESSION: 1. No evidence of acute intracranial abnormality. Specifically, no acute infarct. 2. No substantial change in overall mild scattered T2/FLAIR hyperintensities within the white matter, which are nonspecific but most likely related to chronic microvascular ischemic disease. Electronically Signed   By: Feliberto Harts MD   On: 08/05/2020 19:35   DG Chest Portable 1 View  Result Date: 08/06/2020 CLINICAL DATA:  Chest tightness and dizziness EXAM: PORTABLE CHEST 1 VIEW COMPARISON:  None. FINDINGS: Cardiac and mediastinal contours are unchanged status post median sternotomy. Unchanged mild tortuosity of the thoracic aorta. Lungs are clear.  No large pleural effusion or pneumothorax. IMPRESSION: Clear lungs. Electronically Signed   By: Allegra Lai MD   On: 08/06/2020 13:10   EEG adult  Result Date: 08/05/2020 Charlsie Quest, MD     08/05/2020  8:09 PM Patient Name: Juan Payne MRN: [id number] Epilepsy  Attending: Charlsie Quest Referring Physician/Provider: Dr Arby Barrette Date: 08/05/2020 Duration: 22.51 mins Patient history: 65yo M with ams. EEG to evaluate for seizure Level of alertness: Awake AEDs during EEG study: None Technical aspects: This EEG study was done with scalp electrodes positioned according to the 10-20 International system of electrode placement. Electrical activity was acquired at a sampling rate of 500Hz  and reviewed with a high frequency filter of 70Hz  and a low frequency filter of 1Hz . EEG data were recorded continuously and digitally stored. Description: The posterior dominant rhythm consists of 9-10 Hz activity of moderate voltage (25-35 uV) seen predominantly in posterior head regions, symmetric and reactive to eye opening and eye closing. Hyperventilation and photic stimulation were not performed.   IMPRESSION: This study  is within normal limits. No seizures or epileptiform discharges were seen throughout the recording. Charlsie Quest    Cardiac Studies   Left heart cath from 05/13/19:     IMPRESSION: Mr. Ferrick has left dominant system with severe calcified vessels.  His entire proximal third of his LAD is calcified with diffuse 50 to 60% stenosis and 90% stenosis within this.  He had a high-grade small to medium size second marginal branch stenosis and a high-grade large ostial proximal and mid third obtuse marginal branch stenosis with normal LV function.  I believe the best revascularization strategy would be complete revascularization.  He is not diabetic would be a good candidate for all all arterial conduit procedure if possible.  The sheath was removed and a TR band was placed on the right wrist to achieve patent hemostasis.  The patient left lab in stable condition.  He will be reheparinized 4 hours after sheath removal.  T CTS has been notified.       Patient Profile     Juan Payne is a 65 y.o. male with a hx of CAD with hx of CABG ( LIMA to LAD, left  radial artery to OM2 on 05/14/19), post -op A fib (CABG) on amiodarone, HTN, HLD,  who is being seen 08/06/2020 for the evaluation of chest pressure and elevated Trop at the request of  Dr. Lynelle Doctor.   FYI: Jehovah's witness not accepting blood products  Assessment & Plan    Chest pain Elevated trop  QT prolongation  CAD with hx of CABGX2 in 05/13/2019 - presented with chest tighness and pressure with associated lightheadedness for 2 hours - EKG with new TWI of inferolateral leads and QT prolongation - Hs trop 29 >30 - ACS is ruled out, currently chest pain-free - Etiology include anxiety versus Takotsubo CM versus SSRI withdrawal versus coronary spasm  - Continue medical therapy, carvedilol, losartan, Lipitor, zetia , will prescribe PRN nitro - Repeat EKG today with persistent TWI of inferolateral leads, improved QT prolongation - pending Echo to evaluate LV  - pending stress Myoveiw tomorrow , NPO since  midnight    HTN - BP controlled  - resume home regimen with HCTZ, Coreg, and losartan; may switch HCTZ to amlodipine if needed    HLD - LDL 66 from 07/10/20, at goal, continue zetia and lipitor    Anxiety  SSRI abrupt discontinuation  - on Celexa historically, medication ran out for 3 days at admission, needs refill - given current QT prolongation, unable resume,  will provide low dose PRN Xanax while inhouse   Hx of post-op A fib - occurred after CABG in 2021, no longer on amiodarone and had no recurrence so far     For questions or updates, please contact [facility] Please consult [email or web address] for contact info under        Signed, Cyndi Bender, NP  08/07/2020, 7:32 AM     I have seen and examined the patient along with Cyndi Bender, NP .  I have reviewed the chart, notes and new data.  I agree with PA/NP's note.  Key new complaints: feeling better. No chest pain Key examination changes: normal CV exam. No signs of HF. No arrhythmia on telemetry Key new findings / data:  reviewed echo quickly at bedside. There are no regional wall motion abnormalities (either c/w coronary insufficiency or with takotsubo CMP). ECG is rapidly improving. Tawves almost normal in anterior leads and QTc shorter (almost normal when accounting for broader  QRS).  PLAN: If there is no meaningful reversible ischemia on nuclear study will DC home today, replacing the diuretic with a calcium channel blocker as empirical therapy for coronary vasospasm. If there is high risk ischemia will proceed to cardiac cath.  Thurmon FairMihai Tonique Mendonca, MD, [facility] [phone] 08/07/2020, 10:16 AM

## 2020-08-07 NOTE — Plan of Care (Signed)

## 2020-08-07 NOTE — Plan of Care (Signed)
  Problem: Education: Goal: Ability to demonstrate management of disease process will improve 08/07/2020 1453 by Durward Fortes, RN Outcome: Adequate for Discharge 08/07/2020 [phone] by Durward Fortes, RN Outcome: Progressing Goal: Ability to verbalize understanding of medication therapies will improve 08/07/2020 1453 by Durward Fortes, RN Outcome: Adequate for Discharge 08/07/2020 0714 by Durward Fortes, RN Outcome: Progressing Goal: Individualized Educational Video(s) 08/07/2020 1453 by Durward Fortes, RN Outcome: Adequate for Discharge 08/07/2020 0714 by Durward Fortes, RN Outcome: Progressing   Problem: Activity: Goal: Capacity to carry out activities will improve 08/07/2020 1453 by Durward Fortes, RN Outcome: Adequate for Discharge 08/07/2020 [phone] by Durward Fortes, RN Outcome: Progressing   Problem: Cardiac: Goal: Ability to achieve and maintain adequate cardiopulmonary perfusion will improve 08/07/2020 1453 by Durward Fortes, RN Outcome: Adequate for Discharge 08/07/2020 0714 by Durward Fortes, RN Outcome: Progressing

## 2020-08-10 DIAGNOSIS — I110 Hypertensive heart disease with heart failure: Secondary | ICD-10-CM | POA: Diagnosis not present

## 2020-08-10 DIAGNOSIS — I77810 Thoracic aortic ectasia: Secondary | ICD-10-CM | POA: Diagnosis not present

## 2020-08-10 DIAGNOSIS — I25119 Atherosclerotic heart disease of native coronary artery with unspecified angina pectoris: Secondary | ICD-10-CM | POA: Diagnosis not present

## 2020-08-10 DIAGNOSIS — I5042 Chronic combined systolic (congestive) and diastolic (congestive) heart failure: Secondary | ICD-10-CM | POA: Diagnosis not present

## 2020-08-10 DIAGNOSIS — F411 Generalized anxiety disorder: Secondary | ICD-10-CM | POA: Diagnosis not present

## 2020-08-25 ENCOUNTER — Encounter: Payer: Self-pay | Admitting: Cardiology

## 2020-08-25 ENCOUNTER — Ambulatory Visit: Payer: Medicare Other | Admitting: Cardiology

## 2020-08-25 ENCOUNTER — Other Ambulatory Visit: Payer: Self-pay

## 2020-08-25 VITALS — BP 128/80 | HR 62 | Ht 69.0 in | Wt 171.0 lb

## 2020-08-25 DIAGNOSIS — E782 Mixed hyperlipidemia: Secondary | ICD-10-CM

## 2020-08-25 DIAGNOSIS — I10 Essential (primary) hypertension: Secondary | ICD-10-CM | POA: Diagnosis not present

## 2020-08-25 DIAGNOSIS — Z951 Presence of aortocoronary bypass graft: Secondary | ICD-10-CM | POA: Diagnosis not present

## 2020-08-25 DIAGNOSIS — E663 Overweight: Secondary | ICD-10-CM

## 2020-08-25 DIAGNOSIS — I2510 Atherosclerotic heart disease of native coronary artery without angina pectoris: Secondary | ICD-10-CM

## 2020-08-25 NOTE — Progress Notes (Signed)
Cardiology Office Note:    Date:  08/25/2020   ID:  Juan Payne, DOB July 24, 1955, MRN [id number]  PCP:  Street, Stephanie Coup, MD  Cardiologist:  Thomasene Ripple, DO  Electrophysiologist:  None   Referring MD: Street, Stephanie Coup, *   Chief Complaint  Patient presents with   Hospitalization Follow-up    History of Present Illness:    Juan Payne is a 65 y.o. male with a hx of coronary artery disease status post CABG with LIMA to LAD, left radial artery to OM2 on May 14, 2019, hyperlipidemia, hypertension, postop A. fib  presents today for his post operative visit.   I did see the patient on May 07, 2019 at that time he was experiencing significant symptoms therefore I recommended he go to [facility] ED.  He was admitted to the hospital and underwent a nuclear stress test which was reported to be positive.  The patient was then transferred to [facility] where he underwent left heart catheterization showing multivessel disease.  Was recommended that he undergo coronary artery bypass grafting.  He successfully completed this surgery on May 14, 2019.  Postop stay was complicated by paroxysmal atrial fibrillation he was placed on amiodarone.   I did see the patient on Jun 04, 2019 at that time he was postop doing well, he was doing cardiac rehab he offered no complaints at that time no changes were made in his medications.     I saw the patient on August 08, 2019 at that time he was dizzy, had several stopped combination pill hydrochlorothiazide- losartan.  And only resume losartan at 50 mg daily.   I saw the patient on December 11, 2019 at that time he appeared to be doing well from a cardiovascular standpoint no changes were made.  I saw the patient on July 07, 2020 at that time we restarted his hydrochlorothiazide, continue the patient on losartan 50 mg twice a day with carvedilol 6.25 mg twice daily.  Since I saw the patient he presented to the emergency  department on August 05, 2020 where he was noted to have transient global amnesia.  Then today after he started experience chest discomfort.  He was subsequently hospitalized at [facility] [place] where he has slight high-sensitivity troponin elevation.  His echo did not show any change in his ejection fraction and his nuclear stress test did not show any evidence of ischemia.  His antihypertensive medication were optimized and he was asked to follow-up with me.  Of note the patient wife has been under treatment for breast cancer and the day of his event she had significant reaction to her chemotherapy.  He is here today for follow-up visit.  He is here with his wife.  He tells me since the hospitalization he has had an incident where he had few seconds of left-sided chest pain did not need any nitroglycerin.  But he has not had any repeated episodes since.  No lightheadedness, no dizziness.   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   LIMA to LAD LEFT RADIAL ARTERY to OM2    Unstable angina (HCC)  05/11/2019    Past Surgical History:  Procedure Laterality Date   CORONARY ARTERY BYPASS GRAFT N/A 05/14/2019   Procedure: CORONARY ARTERY BYPASS GRAFTING (CABG) x TWO USING LEFT INTERNAL MAMMARY ARTERY AND LEFT RADIAL ARTERY;  Surgeon: Kerin PernaVan Trigt, Peter, MD;  [facility];  Service: Open Heart Surgery;  Laterality: N/A;   LEFT HEART CATH AND CORONARY ANGIOGRAPHY N/A 05/13/2019   Procedure: LEFT HEART CATH AND CORONARY ANGIOGRAPHY;  Surgeon: Runell GessBerry, Jonathan J, MD;  Location: [facility] INVASIVE CV LAB;  Service: Cardiovascular;  Laterality: N/A;   RADIAL ARTERY HARVEST Left  05/14/2019   Procedure: RADIAL ARTERY HARVEST;  Surgeon: Kerin PernaVan Trigt, Peter, MD;  [facility];  Service: Open Heart Surgery;  Laterality: Left;   TEE WITHOUT CARDIOVERSION N/A 05/14/2019   Procedure: TRANSESOPHAGEAL ECHOCARDIOGRAM (TEE);  Surgeon: Donata ClayVan Trigt, Theron AristaPeter, MD;  [facility];  Service: Open Heart Surgery;  Laterality: N/A;    Current Medications: Current Meds  Medication Sig   amLODipine (NORVASC) 5 MG tablet Take 1 tablet (5 mg total) by mouth daily.   aspirin EC 81 MG tablet Take 81 mg by mouth daily. Swallow whole.   atorvastatin (LIPITOR) 40 MG tablet Take 1 tablet (40 mg total) by mouth daily at 6 PM.   B Complex-C (SUPER B COMPLEX PO) Take 1 tablet by mouth daily.   carvedilol (COREG) 6.25 MG tablet Take 1 tablet (6.25 mg total) by mouth 2 (two) times daily.   Cholecalciferol (VITAMIN D3 PO) Take 1 tablet by mouth daily.   citalopram (CELEXA) 20 MG tablet Take 20 mg by mouth at bedtime.    ezetimibe (ZETIA) 10 MG tablet Take 1 tablet (10 mg total) by mouth daily.   KRILL OIL PO Take 1 tablet by mouth daily.   losartan (COZAAR) 50 MG tablet Take one tablet by mouth daily in the morning. Then take 1/2 tablet by mouth daily in the evening.   nitroGLYCERIN (NITROSTAT) 0.4 MG SL tablet Place 1 tablet (0.4 mg total) under the tongue every 5 (five) minutes as needed for chest pain.   TURMERIC CURCUMIN PO Take 1 tablet by mouth daily.     Allergies:   Morphine and related   Social History   Socioeconomic History   Marital status: Married    Spouse name: Not on file   Number of children: Not on file   Years of education: Not on file   Highest education level: Not on file  Occupational History   Not on file  Tobacco Use   Smoking status: Never   Smokeless tobacco: Never  Substance and Sexual Activity   Alcohol use: Not Currently   Drug use: Never   Sexual activity: Not on file  Other Topics Concern   Not on file  Social History Narrative   ** Merged History Encounter  **       Social Determinants of Health   Financial Resource Strain: Not on file  Food Insecurity: Not on file  Transportation Needs: Not on file  Physical Activity: Not on file  Stress: Not on file  Social Connections: Not on file     Family History: The patient's family history includes Heart attack in his brother and father; Hypertension in his mother.  ROS:   Review of Systems  Constitution: Negative for decreased appetite, fever and weight gain.  HENT: Negative for congestion, ear discharge, hoarse voice and sore throat.   Eyes: Negative for discharge, redness, vision loss in right eye and visual halos.  Cardiovascular: Negative for chest pain, dyspnea on exertion, leg swelling, orthopnea and palpitations.  Respiratory: Negative for cough, hemoptysis, shortness of breath and snoring.   Endocrine: Negative for heat intolerance and polyphagia.  Hematologic/Lymphatic: Negative for bleeding problem. Does not bruise/bleed easily.  Skin: Negative for flushing, nail changes, rash and suspicious lesions.  Musculoskeletal: Negative for arthritis, joint pain, muscle cramps, myalgias, neck pain and stiffness.  Gastrointestinal: Negative for abdominal pain, bowel incontinence, diarrhea and excessive appetite.  Genitourinary: Negative for decreased libido, genital sores and incomplete emptying.  Neurological: Negative for brief paralysis, focal weakness, headaches and loss of balance.  Psychiatric/Behavioral: Negative for altered mental status, depression and suicidal ideas.  Allergic/Immunologic: Negative for HIV exposure and persistent infections.    EKGs/Labs/Other Studies Reviewed:    The following studies were reviewed today:   EKG: None today  Lexiscan done on August 07, 2020  Narrative & Impression    There was no ST segment deviation noted during stress. No T wave inversion was noted during stress. No change from start; V5/V6 improved Findings consistent with prior  myocardial infarction. This is an intermediate risk study. The left ventricular ejection fraction is normal (55-65%). Nuclear stress EF: 56%.   1. There is a small (~6-8% of LV), mild fixed perfusion defect present in the mid inferolateral and apical lateral segments.  This appears to be consistent with prior posterior wall MI.   2. There are no reversible perfusion defects.   3. Hypokinesis of the inferolateral wall noted. 4. Normal LVEF, 56%. 5. Overall, this is an intermediate risk study based on evidence of prior infarction.    TTE 08/07/2020 IMPRESSIONS   1. Hypokinesis of the mid to apical posterolateral myocardium. Left  ventricular ejection fraction, by estimation, is 45 to 50%. The left  ventricle has mildly decreased function. The left ventricle demonstrates  regional wall motion abnormalities (see  scoring diagram/findings for description). There is mild asymmetric left  ventricular hypertrophy of the basal-septal segment. Left ventricular  diastolic parameters are consistent with Grade I diastolic dysfunction  (impaired relaxation).   2. Right ventricular systolic function is normal. The right ventricular  size is normal. There is normal pulmonary artery systolic pressure.   3. The mitral valve is normal in structure. Trivial mitral valve  regurgitation. No evidence of mitral stenosis.   4. The aortic valve is tricuspid. Aortic valve regurgitation is trivial.  No aortic stenosis is present.   5. Aortic dilatation noted. There is borderline dilatation of the  ascending aorta, measuring 36 mm.   6. The inferior vena cava is normal in size with greater than 50%  respiratory variability, suggesting right atrial pressure of 3 mmHg.   FINDINGS   Left Ventricle: Hypokinesis of the mid to apical posterolateral  myocardium. Left ventricular ejection fraction, by estimation, is 45 to  50%. The left ventricle has mildly decreased function. The left ventricle  demonstrates  regional wall motion  abnormalities. Definity contrast agent was given IV to delineate the left  ventricular endocardial borders. The left ventricular internal cavity size  was normal in size. There is mild asymmetric left ventricular hypertrophy  of the basal-septal segment.  Left ventricular diastolic parameters are consistent with Grade I  diastolic dysfunction (impaired relaxation).   Right Ventricle: The right ventricular size is normal. No increase in  right ventricular wall thickness. Right ventricular systolic function is  normal. There is normal pulmonary artery systolic pressure. The tricuspid  regurgitant velocity is 2.43 m/s, and   with  an assumed right atrial pressure of 3 mmHg, the estimated right  ventricular systolic pressure is 26.6 mmHg.   Left Atrium: Left atrial size was normal in size.   Right Atrium: Right atrial size was normal in size.   Pericardium: There is no evidence of pericardial effusion.   Mitral Valve: The mitral valve is normal in structure. Trivial mitral  valve regurgitation. No evidence of mitral valve stenosis.   Tricuspid Valve: The tricuspid valve is normal in structure. Tricuspid  valve regurgitation is trivial. No evidence of tricuspid stenosis.   Aortic Valve: The aortic valve is tricuspid. Aortic valve regurgitation is  trivial. No aortic stenosis is present.   Pulmonic Valve: The pulmonic valve was normal in structure. Pulmonic valve  regurgitation is not visualized. No evidence of pulmonic stenosis.   Aorta: Aortic dilatation noted. There is borderline dilatation of the  ascending aorta, measuring 36 mm.   Venous: The inferior vena cava is normal in size with greater than 50%  respiratory variability, suggesting right atrial pressure of 3 mmHg.   IAS/Shunts: No atrial level shunt detected by color flow Doppler  Recent Labs: 07/10/2020: Magnesium 2.0 08/06/2020: ALT 30; B Natriuretic Peptide 177.7; Hemoglobin 15.0; Platelets  245 08/07/2020: BUN 12; Creatinine, Ser 0.93; Potassium 4.3; Sodium 138  Recent Lipid Panel    Component Value Date/Time   CHOL 145 07/10/2020 0838   TRIG 69 07/10/2020 0838   HDL 65 07/10/2020 0838   CHOLHDL 2.2 07/10/2020 0838   CHOLHDL 3.7 05/12/2019 0216   VLDL 27 05/12/2019 0216   LDLCALC 66 07/10/2020 0838    Physical Exam:    VS:  BP 128/80 (BP Location: Right Arm, Patient Position: Sitting, Cuff Size: Normal)   Pulse 62   Ht  (1.753 m)   Wt 171 lb (77.6 kg)   SpO2 99%   BMI 25.25 kg/m     Wt Readings from Last 3 Encounters:  08/25/20 171 lb (77.6 kg)  08/06/20 (!) 367 lb 4.6 oz (166.6 kg)  08/05/20 170 lb (77.1 kg)     GEN: Well nourished, well developed in no acute distress HEENT: Normal NECK: No JVD; No carotid bruits LYMPHATICS: No lymphadenopathy CARDIAC: S1S2 noted,RRR, no murmurs, rubs, gallops RESPIRATORY:  Clear to auscultation without rales, wheezing or rhonchi  ABDOMEN: Soft, non-tender, non-distended, +bowel sounds, no guarding. EXTREMITIES: No edema, No cyanosis, no clubbing MUSCULOSKELETAL:  No deformity  SKIN: Warm and dry NEUROLOGIC:  Alert and oriented x 3, non-focal PSYCHIATRIC:  Normal affect, good insight  ASSESSMENT:    1. Coronary artery disease involving native coronary artery of native heart without angina pectoris   2. Essential hypertension   3. S/P CABG x 2   4. Mixed hyperlipidemia   5. Overweight (BMI 25.0-29.9)    PLAN:    The patient had questions about his lab work that was done in the hospital and we were able to review with both together and questions were answered.  He has not had any anginal related pain.  I have educated patient on what to look for.  And also educated him on how to take his nitroglycerin.  I have asked the patient if he finds himself taking nitroglycerin on a daily basis to notify my office as we will consider long acting nitrates if that is the case.  Thankfully his blood pressure is at goal.  We  will continue his current dose of amlodipine 5 mg daily, carvedilol 6.25 mg twice daily and losartan 50 mg  twice daily. Continue his aspirin 81 mg daily along with his atorvastatin and Zetia.  We talked about his upcoming stress with his wife giving the diagnosis and treatment.  He is on Celexa as well as as needed Xanax from his primary care provider.    We will get blood work today for [employer].  The patient is in agreement with the above plan. The patient left the office in stable condition.  The patient will follow up in 3 months or sooner if needed.   Medication Adjustments/Labs and Tests Ordered: Current medicines are reviewed at length with the patient today.  Concerns regarding medicines are outlined above.  Orders Placed This Encounter  Procedures   Basic metabolic panel    No orders of the defined types were placed in this encounter.   Patient Instructions  Medication Instructions:  Your physician recommends that you continue on your current medications as directed. Please refer to the Current Medication list given to you today.  *If you need a refill on your cardiac medications before your next appointment, please call your pharmacy*   Lab Work: Your physician recommends that you return for lab work in: Today for BMP  If you have labs (blood work) drawn today and your tests are completely normal, you will receive your results only by: MyChart Message (if you have MyChart) OR A paper copy in the mail If you have any lab test that is abnormal or we need to change your treatment, we will call you to review the results.   Testing/Procedures: NONE   Follow-Up: At [facility], you and your health needs are our priority.  As part of our continuing mission to provide you with exceptional heart care, we have created designated Provider Care Teams.  These Care Teams include your primary Cardiologist (physician) and Advanced Practice Providers (APPs -  Physician Assistants and  Nurse Practitioners) who all work together to provide you with the care you need, when you need it.  We recommend signing up for the patient portal called "MyChart".  Sign up information is provided on this After Visit Summary.  MyChart is used to connect with patients for Virtual Visits (Telemedicine).  Patients are able to view lab/test results, encounter notes, upcoming appointments, etc.  Non-urgent messages can be sent to your provider as well.   To learn more about what you can do with MyChart, go to [email or web address].    Your next appointment:   3 month(s)  The format for your next appointment:   In Person  Provider:   Thomasene Ripple @ [facility]   Other Instructions     Adopting a Healthy Lifestyle.  Know what a healthy weight is for you (roughly BMI <25) and aim to maintain this   Aim for 7+ servings of fruits and vegetables daily   65-80+ fluid ounces of water or unsweet tea for healthy kidneys   Limit to max 1 drink of alcohol per day; avoid smoking/tobacco   Limit animal fats in diet for cholesterol and heart health - choose grass fed whenever available   Avoid highly processed foods, and foods high in saturated/trans fats   Aim for low stress - take time to unwind and care for your mental health   Aim for 150 min of moderate intensity exercise weekly for heart health, and weights twice weekly for bone health   Aim for 7-9 hours of sleep daily   When it comes to diets, agreement about the perfect plan isnt easy to find,  even among the experts. Experts at the [facility] of [employer] developed an idea known as the Healthy Eating Plate. Just imagine a plate divided into logical, healthy portions.   The emphasis is on diet quality:   Load up on vegetables and fruits - one-half of your plate: Aim for color and variety, and remember that potatoes dont count.   Go for whole grains - one-quarter of your plate: Whole wheat, barley, wheat berries, quinoa,  oats, brown rice, and foods made with them. If you want pasta, go with whole wheat pasta.   Protein power - one-quarter of your plate: Fish, chicken, beans, and nuts are all healthy, versatile protein sources. Limit red meat.   The diet, however, does go beyond the plate, offering a few other suggestions.   Use healthy plant oils, such as olive, canola, soy, corn, sunflower and peanut. Check the labels, and avoid partially hydrogenated oil, which have unhealthy trans fats.   If youre thirsty, drink water. Coffee and tea are good in moderation, but skip sugary drinks and limit milk and dairy products to one or two daily servings.   The type of carbohydrate in the diet is more important than the amount. Some sources of carbohydrates, such as vegetables, fruits, whole grains, and beans-are healthier than others.   Finally, stay active  Signed, Thomasene Ripple, DO  08/25/2020 11:08 AM    [facility]

## 2020-08-25 NOTE — Patient Instructions (Signed)
Medication Instructions:  Your physician recommends that you continue on your current medications as directed. Please refer to the Current Medication list given to you today.  *If you need a refill on your cardiac medications before your next appointment, please call your pharmacy*   Lab Work: Your physician recommends that you return for lab work in: Today for BMP  If you have labs (blood work) drawn today and your tests are completely normal, you will receive your results only by: MyChart Message (if you have MyChart) OR A paper copy in the mail If you have any lab test that is abnormal or we need to change your treatment, we will call you to review the results.   Testing/Procedures: NONE   Follow-Up: At [facility], you and your health needs are our priority.  As part of our continuing mission to provide you with exceptional heart care, we have created designated Provider Care Teams.  These Care Teams include your primary Cardiologist (physician) and Advanced Practice Providers (APPs -  Physician Assistants and Nurse Practitioners) who all work together to provide you with the care you need, when you need it.  We recommend signing up for the patient portal called "MyChart".  Sign up information is provided on this After Visit Summary.  MyChart is used to connect with patients for Virtual Visits (Telemedicine).  Patients are able to view lab/test results, encounter notes, upcoming appointments, etc.  Non-urgent messages can be sent to your provider as well.   To learn more about what you can do with MyChart, go to [email or web address].    Your next appointment:   3 month(s)  The format for your next appointment:   In Person  Provider:   Lavona Mound Tobb @ [facility]   Other Instructions

## 2020-08-26 LAB — BASIC METABOLIC PANEL
BUN/Creatinine Ratio: 17 (ref 10–24)
BUN: 13 mg/dL (ref 8–27)
CO2: 24 mmol/L (ref 20–29)
Calcium: 9.6 mg/dL (ref 8.6–10.2)
Chloride: 103 mmol/L (ref 96–106)
Creatinine, Ser: 0.76 mg/dL (ref 0.76–1.27)
Glucose: 92 mg/dL (ref 65–99)
Potassium: 4.6 mmol/L (ref 3.5–5.2)
Sodium: 142 mmol/L (ref 134–144)
eGFR: 100 mL/min/{1.73_m2} (ref >59)

## 2020-08-31 ENCOUNTER — Other Ambulatory Visit: Payer: Self-pay

## 2020-08-31 MED ORDER — AMLODIPINE BESYLATE 5 MG PO TABS: 5.0000 mg | ORAL_TABLET | Freq: Every day | ORAL | 3 refills | Status: DC

## 2020-08-31 NOTE — Telephone Encounter (Signed)
Refill of Amlodipine 5 mg sent to [facility].

## 2020-09-07 ENCOUNTER — Telehealth: Payer: Medicare Other | Admitting: Cardiology

## 2020-09-17 DIAGNOSIS — N401 Enlarged prostate with lower urinary tract symptoms: Secondary | ICD-10-CM | POA: Diagnosis not present

## 2020-09-17 DIAGNOSIS — Z125 Encounter for screening for malignant neoplasm of prostate: Secondary | ICD-10-CM | POA: Diagnosis not present

## 2020-09-17 DIAGNOSIS — R351 Nocturia: Secondary | ICD-10-CM | POA: Diagnosis not present

## 2020-09-17 DIAGNOSIS — Z0000 Encounter for general adult medical examination without abnormal findings: Secondary | ICD-10-CM | POA: Diagnosis not present

## 2020-10-26 DIAGNOSIS — G4733 Obstructive sleep apnea (adult) (pediatric): Secondary | ICD-10-CM | POA: Diagnosis not present

## 2020-11-02 ENCOUNTER — Other Ambulatory Visit: Payer: Self-pay | Admitting: Cardiology

## 2020-11-26 DIAGNOSIS — G4733 Obstructive sleep apnea (adult) (pediatric): Secondary | ICD-10-CM | POA: Diagnosis not present

## 2020-12-01 ENCOUNTER — Encounter: Payer: Self-pay | Admitting: Cardiology

## 2020-12-01 ENCOUNTER — Other Ambulatory Visit: Payer: Self-pay

## 2020-12-01 ENCOUNTER — Ambulatory Visit: Payer: Medicare Other | Admitting: Cardiology

## 2020-12-01 VITALS — BP 136/86 | HR 57 | Ht 69.0 in | Wt 169.4 lb

## 2020-12-01 DIAGNOSIS — I10 Essential (primary) hypertension: Secondary | ICD-10-CM | POA: Diagnosis not present

## 2020-12-01 DIAGNOSIS — I2510 Atherosclerotic heart disease of native coronary artery without angina pectoris: Secondary | ICD-10-CM | POA: Diagnosis not present

## 2020-12-01 DIAGNOSIS — I480 Paroxysmal atrial fibrillation: Secondary | ICD-10-CM

## 2020-12-01 DIAGNOSIS — Z951 Presence of aortocoronary bypass graft: Secondary | ICD-10-CM

## 2020-12-01 DIAGNOSIS — E782 Mixed hyperlipidemia: Secondary | ICD-10-CM

## 2020-12-01 DIAGNOSIS — R42 Dizziness and giddiness: Secondary | ICD-10-CM

## 2020-12-01 DIAGNOSIS — E7801 Familial hypercholesterolemia: Secondary | ICD-10-CM

## 2020-12-01 NOTE — Progress Notes (Signed)
Cardiology Office Note:    Date:  12/01/2020   ID:  Ashok Sawaya, DOB 1955-11-25, MRN [id number]  PCP:  Street, Stephanie Coup, MD  Cardiologist:  Thomasene Ripple, DO  Electrophysiologist:  None   Referring MD: Street, Stephanie Coup, *    " I am doing ok- I lost my wife"   History of Present Illness:    Juan Payne is a 65 y.o. male with a hx of  of coronary artery disease status post CABG with LIMA to LAD, left radial artery to OM2 on May 14, 2019, hyperlipidemia, hypertension, postop A. fib  presents today for his post operative visit.   I did see the patient on May 07, 2019 at that time he was experiencing significant symptoms therefore I recommended he go to [facility] ED.  He was admitted to the hospital and underwent a nuclear stress test which was reported to be positive.  The patient was then transferred to [facility] where he underwent left heart catheterization showing multivessel disease.  Was recommended that he undergo coronary artery bypass grafting.  He successfully completed this surgery on May 14, 2019.  Postop stay was complicated by paroxysmal atrial fibrillation he was placed on amiodarone.   I did see the patient on Jun 04, 2019 at that time he was postop doing well, he was doing cardiac rehab he offered no complaints at that time no changes were made in his medications.     I saw the patient on August 08, 2019 at that time he was dizzy, had several stopped combination pill hydrochlorothiazide- losartan.  And only resume losartan at 50 mg daily.    I saw the patient on December 11, 2019 at that time he appeared to be doing well from a cardiovascular standpoint no changes were made.   I saw the patient on July 07, 2020 at that time we restarted his hydrochlorothiazide, continue the patient on losartan 50 mg twice a day with carvedilol 6.25 mg twice daily.  At his last visit which was on August 25, 2020 he was status post hospitalization at  [facility] where he had a nuclear stress test did not show any evidence of any ischemia.  His medication was adjusted.  During that visit he was with his wife who was undergoing some side effects from her chemotherapy.  Since I saw the patient he tells me that he has lost his wife.  From a cardiovascular standpoint he has no complaints.  But he notes that he is having some dizziness for change in position.  He is pending a visit with his hearing specialist.  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   LIMA to LAD LEFT RADIAL ARTERY to OM2    Unstable angina (HCC) 05/11/2019    Past Surgical History:  Procedure Laterality Date   CORONARY ARTERY BYPASS GRAFT N/A 05/14/2019   Procedure: CORONARY ARTERY BYPASS GRAFTING (CABG) x TWO USING LEFT INTERNAL MAMMARY ARTERY AND LEFT RADIAL ARTERY;  Surgeon: Kerin Perna, MD;  Location: [facility] OR;  Service: Open Heart Surgery;  Laterality: N/A;   LEFT HEART CATH  AND CORONARY ANGIOGRAPHY N/A 05/13/2019   Procedure: LEFT HEART CATH AND CORONARY ANGIOGRAPHY;  Surgeon: Runell Gess, MD;  Location: [facility] INVASIVE CV LAB;  Service: Cardiovascular;  Laterality: N/A;   RADIAL ARTERY HARVEST Left 05/14/2019   Procedure: RADIAL ARTERY HARVEST;  Surgeon: Kerin Perna, MD;  Location: [facility] OR;  Service: Open Heart Surgery;  Laterality: Left;   TEE WITHOUT CARDIOVERSION N/A 05/14/2019   Procedure: TRANSESOPHAGEAL ECHOCARDIOGRAM (TEE);  Surgeon: Donata Clay, Theron Arista, MD;  Location: [facility] OR;  Service: Open Heart Surgery;  Laterality: N/A;    Current Medications: Current Meds  Medication Sig   amLODipine (NORVASC) 5 MG  tablet Take 1 tablet (5 mg total) by mouth daily.   aspirin EC 81 MG tablet Take 81 mg by mouth daily. Swallow whole.   atorvastatin (LIPITOR) 40 MG tablet Take 1 tablet (40 mg total) by mouth daily at 6 PM.   carvedilol (COREG) 6.25 MG tablet Take 1 tablet (6.25 mg total) by mouth 2 (two) times daily.   citalopram (CELEXA) 20 MG tablet Take 20 mg by mouth at bedtime.    ezetimibe (ZETIA) 10 MG tablet Take 1 tablet (10 mg total) by mouth daily.   losartan (COZAAR) 50 MG tablet TAKE 1 TABLET BY MOUTH IN THE MORNING THEN TAKE 1/2 TABLET IN THE EVENING.   nitroGLYCERIN (NITROSTAT) 0.4 MG SL tablet Place 1 tablet (0.4 mg total) under the tongue every 5 (five) minutes as needed for chest pain.     Allergies:   Morphine and related   Social History   Socioeconomic History   Marital status: Married    Spouse name: Not on file   Number of children: Not on file   Years of education: Not on file   Highest education level: Not on file  Occupational History   Not on file  Tobacco Use   Smoking status: Never   Smokeless tobacco: Never  Substance and Sexual Activity   Alcohol use: Not Currently   Drug use: Never   Sexual activity: Not on file  Other Topics Concern   Not on file  Social History Narrative   ** Merged History Encounter **       Social Determinants of Health   Financial Resource Strain: Not on file  Food Insecurity: Not on file  Transportation Needs: Not on file  Physical Activity: Not on file  Stress: Not on file  Social Connections: Not on file     Family History: The patient's family history includes Heart attack in his brother and father; Hypertension in his mother.  ROS:   Review of Systems  Constitution: Negative for decreased appetite, fever and weight gain.  HENT: Negative for congestion, ear discharge, hoarse voice and sore throat.   Eyes: Negative for discharge, redness, vision loss in right eye and visual halos.  Cardiovascular: Negative for chest pain,  dyspnea on exertion, leg swelling, orthopnea and palpitations.  Respiratory: Negative for cough, hemoptysis, shortness of breath and snoring.   Endocrine: Negative for heat intolerance and polyphagia.  Hematologic/Lymphatic: Negative for bleeding problem. Does not bruise/bleed easily.  Skin: Negative for flushing, nail changes, rash and suspicious lesions.  Musculoskeletal: Negative for arthritis, joint pain, muscle cramps, myalgias, neck pain and stiffness.  Gastrointestinal: Negative for abdominal pain, bowel incontinence, diarrhea and excessive appetite.  Genitourinary: Negative for decreased libido, genital sores and incomplete emptying.  Neurological: Negative for brief paralysis, focal weakness, headaches and loss of balance.  Psychiatric/Behavioral: Negative for altered mental status, depression and  suicidal ideas.  Allergic/Immunologic: Negative for HIV exposure and persistent infections.    EKGs/Labs/Other Studies Reviewed:    The following studies were reviewed today:   EKG:  The ekg ordered today demonstrates    Lexiscan done on August 07, 2020   Narrative & Impression    There was no ST segment deviation noted during stress. No T wave inversion was noted during stress. No change from start; V5/V6 improved Findings consistent with prior myocardial infarction. This is an intermediate risk study. The left ventricular ejection fraction is normal (55-65%). Nuclear stress EF: 56%.   1. There is a small (~6-8% of LV), mild fixed perfusion defect present in the mid inferolateral and apical lateral segments.  This appears to be consistent with prior posterior wall MI.   2. There are no reversible perfusion defects.   3. Hypokinesis of the inferolateral wall noted. 4. Normal LVEF, 56%. 5. Overall, this is an intermediate risk study based on evidence of prior infarction.      TTE 08/07/2020 IMPRESSIONS   1. Hypokinesis of the mid to apical posterolateral myocardium. Left   ventricular ejection fraction, by estimation, is 45 to 50%. The left  ventricle has mildly decreased function. The left ventricle demonstrates  regional wall motion abnormalities (see  scoring diagram/findings for description). There is mild asymmetric left  ventricular hypertrophy of the basal-septal segment. Left ventricular  diastolic parameters are consistent with Grade I diastolic dysfunction  (impaired relaxation).   2. Right ventricular systolic function is normal. The right ventricular  size is normal. There is normal pulmonary artery systolic pressure.   3. The mitral valve is normal in structure. Trivial mitral valve  regurgitation. No evidence of mitral stenosis.   4. The aortic valve is tricuspid. Aortic valve regurgitation is trivial.  No aortic stenosis is present.   5. Aortic dilatation noted. There is borderline dilatation of the  ascending aorta, measuring 36 mm.   6. The inferior vena cava is normal in size with greater than 50%  respiratory variability, suggesting right atrial pressure of 3 mmHg.   FINDINGS   Left Ventricle: Hypokinesis of the mid to apical posterolateral  myocardium. Left ventricular ejection fraction, by estimation, is 45 to  50%. The left ventricle has mildly decreased function. The left ventricle  demonstrates regional wall motion  abnormalities. Definity contrast agent was given IV to delineate the left  ventricular endocardial borders. The left ventricular internal cavity size  was normal in size. There is mild asymmetric left ventricular hypertrophy  of the basal-septal segment.  Left ventricular diastolic parameters are consistent with Grade I  diastolic dysfunction (impaired relaxation).   Right Ventricle: The right ventricular size is normal. No increase in  right ventricular wall thickness. Right ventricular systolic function is  normal. There is normal pulmonary artery systolic pressure. The tricuspid  regurgitant velocity is 2.43  m/s, and   with an assumed right atrial pressure of 3 mmHg, the estimated right  ventricular systolic pressure is 26.6 mmHg.   Left Atrium: Left atrial size was normal in size.   Right Atrium: Right atrial size was normal in size.   Pericardium: There is no evidence of pericardial effusion.   Mitral Valve: The mitral valve is normal in structure. Trivial mitral  valve regurgitation. No evidence of mitral valve stenosis.   Tricuspid Valve: The tricuspid valve is normal in structure. Tricuspid  valve regurgitation is trivial. No evidence of tricuspid stenosis.   Aortic Valve: The aortic valve is tricuspid. Aortic  valve regurgitation is  trivial. No aortic stenosis is present.   Pulmonic Valve: The pulmonic valve was normal in structure. Pulmonic valve  regurgitation is not visualized. No evidence of pulmonic stenosis.   Aorta: Aortic dilatation noted. There is borderline dilatation of the  ascending aorta, measuring 36 mm.   Venous: The inferior vena cava is normal in size with greater than 50%  respiratory variability, suggesting right atrial pressure of 3 mmHg.   IAS/Shunts: No atrial level shunt detected by color flow Doppler   Recent Labs: 07/10/2020: Magnesium 2.0 08/06/2020: ALT 30; B Natriuretic Peptide 177.7; Hemoglobin 15.0; Platelets 245 08/25/2020: BUN 13; Creatinine, Ser 0.76; Potassium 4.6; Sodium 142  Recent Lipid Panel    Component Value Date/Time   CHOL 145 07/10/2020 0838   TRIG 69 07/10/2020 0838   HDL 65 07/10/2020 0838   CHOLHDL 2.2 07/10/2020 0838   CHOLHDL 3.7 05/12/2019 0216   VLDL 27 05/12/2019 0216   LDLCALC 66 07/10/2020 0838    Physical Exam:    VS:  BP 136/86   Pulse (!) 57   Ht 5\' 9"  (1.753 m)   Wt 169 lb 6.4 oz (76.8 kg)   SpO2 98%   BMI 25.02 kg/m     Wt Readings from Last 3 Encounters:  12/01/20 169 lb 6.4 oz (76.8 kg)  08/25/20 171 lb (77.6 kg)  08/06/20 (!) 367 lb 4.6 oz (166.6 kg)     GEN: Well nourished, well developed in no  acute distress HEENT: Normal NECK: No JVD; No carotid bruits LYMPHATICS: No lymphadenopathy CARDIAC: S1S2 noted,RRR, no murmurs, rubs, gallops RESPIRATORY:  Clear to auscultation without rales, wheezing or rhonchi  ABDOMEN: Soft, non-tender, non-distended, +bowel sounds, no guarding. EXTREMITIES: No edema, No cyanosis, no clubbing MUSCULOSKELETAL:  No deformity  SKIN: Warm and dry NEUROLOGIC:  Alert and oriented x 3, non-focal PSYCHIATRIC:  Normal affect, good insight  ASSESSMENT:    1. Coronary artery disease involving native coronary artery of native heart without angina pectoris   2. Essential hypertension   3. Postoperative atrial fibrillation   4. S/P CABG x 2   5. Mixed hyperlipidemia   6. Familial hypercholesterolemia   7. Dizziness    PLAN:     Is stable from a cardiovascular standpoint.  No anginal symptoms.  No changes to his medication today. He is very devastated from the loss of his wife he is going through the grief process.  He also tells me he have a lot of support from friends family and church members. In terms of his this is that I do suspect this is likely inner ear condition he is seeing his hearing specialist which I asked him to discuss with him.  His A. fib is postoperative at this time no need for anticoagulation.  Blood pressure is acceptable, continue with current antihypertensive regimen.  Hyperlipidemia - continue with current statin medication.  The patient is in agreement with the above plan. The patient left the office in stable condition.  The patient will follow up in 6 months or sooner if needed.   Medication Adjustments/Labs and Tests Ordered: Current medicines are reviewed at length with the patient today.  Concerns regarding medicines are outlined above.  No orders of the defined types were placed in this encounter.  No orders of the defined types were placed in this encounter.   Patient Instructions  Medication Instructions:  Your  physician recommends that you continue on your current medications as directed. Please refer to the Current  Medication list given to you today.  *If you need a refill on your cardiac medications before your next appointment, please call your pharmacy*   Lab Work: None If you have labs (blood work) drawn today and your tests are completely normal, you will receive your results only by: MyChart Message (if you have MyChart) OR A paper copy in the mail If you have any lab test that is abnormal or we need to change your treatment, we will call you to review the results.   Testing/Procedures: None   Follow-Up: At [facility], you and your health needs are our priority.  As part of our continuing mission to provide you with exceptional heart care, we have created designated Provider Care Teams.  These Care Teams include your primary Cardiologist (physician) and Advanced Practice Providers (APPs -  Physician Assistants and Nurse Practitioners) who all work together to provide you with the care you need, when you need it.  We recommend signing up for the patient portal called "MyChart".  Sign up information is provided on this After Visit Summary.  MyChart is used to connect with patients for Virtual Visits (Telemedicine).  Patients are able to view lab/test results, encounter notes, upcoming appointments, etc.  Non-urgent messages can be sent to your provider as well.   To learn more about what you can do with MyChart, go to [email or web address].    Your next appointment:   6 month(s)  The format for your next appointment:   In Person  Provider:   Thomasene Ripple, DO     Other Instructions     Adopting a Healthy Lifestyle.  Know what a healthy weight is for you (roughly BMI <25) and aim to maintain this   Aim for 7+ servings of fruits and vegetables daily   65-80+ fluid ounces of water or unsweet tea for healthy kidneys   Limit to max 1 drink of alcohol per day; avoid  smoking/tobacco   Limit animal fats in diet for cholesterol and heart health - choose grass fed whenever available   Avoid highly processed foods, and foods high in saturated/trans fats   Aim for low stress - take time to unwind and care for your mental health   Aim for 150 min of moderate intensity exercise weekly for heart health, and weights twice weekly for bone health   Aim for 7-9 hours of sleep daily   When it comes to diets, agreement about the perfect plan isnt easy to find, even among the experts. Experts at the [facility] [employer] developed an idea known as the Healthy Eating Plate. Just imagine a plate divided into logical, healthy portions.   The emphasis is on diet quality:   Load up on vegetables and fruits - one-half of your plate: Aim for color and variety, and remember that potatoes dont count.   Go for whole grains - one-quarter of your plate: Whole wheat, barley, wheat berries, quinoa, oats, brown rice, and foods made with them. If you want pasta, go with whole wheat pasta.   Protein power - one-quarter of your plate: Fish, chicken, beans, and nuts are all healthy, versatile protein sources. Limit red meat.   The diet, however, does go beyond the plate, offering a few other suggestions.   Use healthy plant oils, such as olive, canola, soy, corn, sunflower and peanut. Check the labels, and avoid partially hydrogenated oil, which have unhealthy trans fats.   If youre thirsty, drink water. Coffee and tea are good  in moderation, but skip sugary drinks and limit milk and dairy products to one or two daily servings.   The type of carbohydrate in the diet is more important than the amount. Some sources of carbohydrates, such as vegetables, fruits, whole grains, and beans-are healthier than others.   Finally, stay active  Signed, Thomasene Ripple, DO  12/01/2020 1:15 PM    [facility]

## 2020-12-01 NOTE — Patient Instructions (Signed)

## 2020-12-09 DIAGNOSIS — E785 Hyperlipidemia, unspecified: Secondary | ICD-10-CM | POA: Diagnosis not present

## 2020-12-09 DIAGNOSIS — I10 Essential (primary) hypertension: Secondary | ICD-10-CM | POA: Diagnosis not present

## 2020-12-26 DIAGNOSIS — G4733 Obstructive sleep apnea (adult) (pediatric): Secondary | ICD-10-CM | POA: Diagnosis not present

## 2020-12-31 ENCOUNTER — Other Ambulatory Visit: Payer: Self-pay | Admitting: Cardiology

## 2021-01-04 IMAGING — DX DG CHEST 2V
2 series · 2 of 2 positions shown · non-contrast
Comparison: 05/18/2019

CLINICAL DATA: Asymptomatic.  CABG x2.

EXAM:
CHEST - 2 VIEW

[dg chest 2 view (1 of 2)]
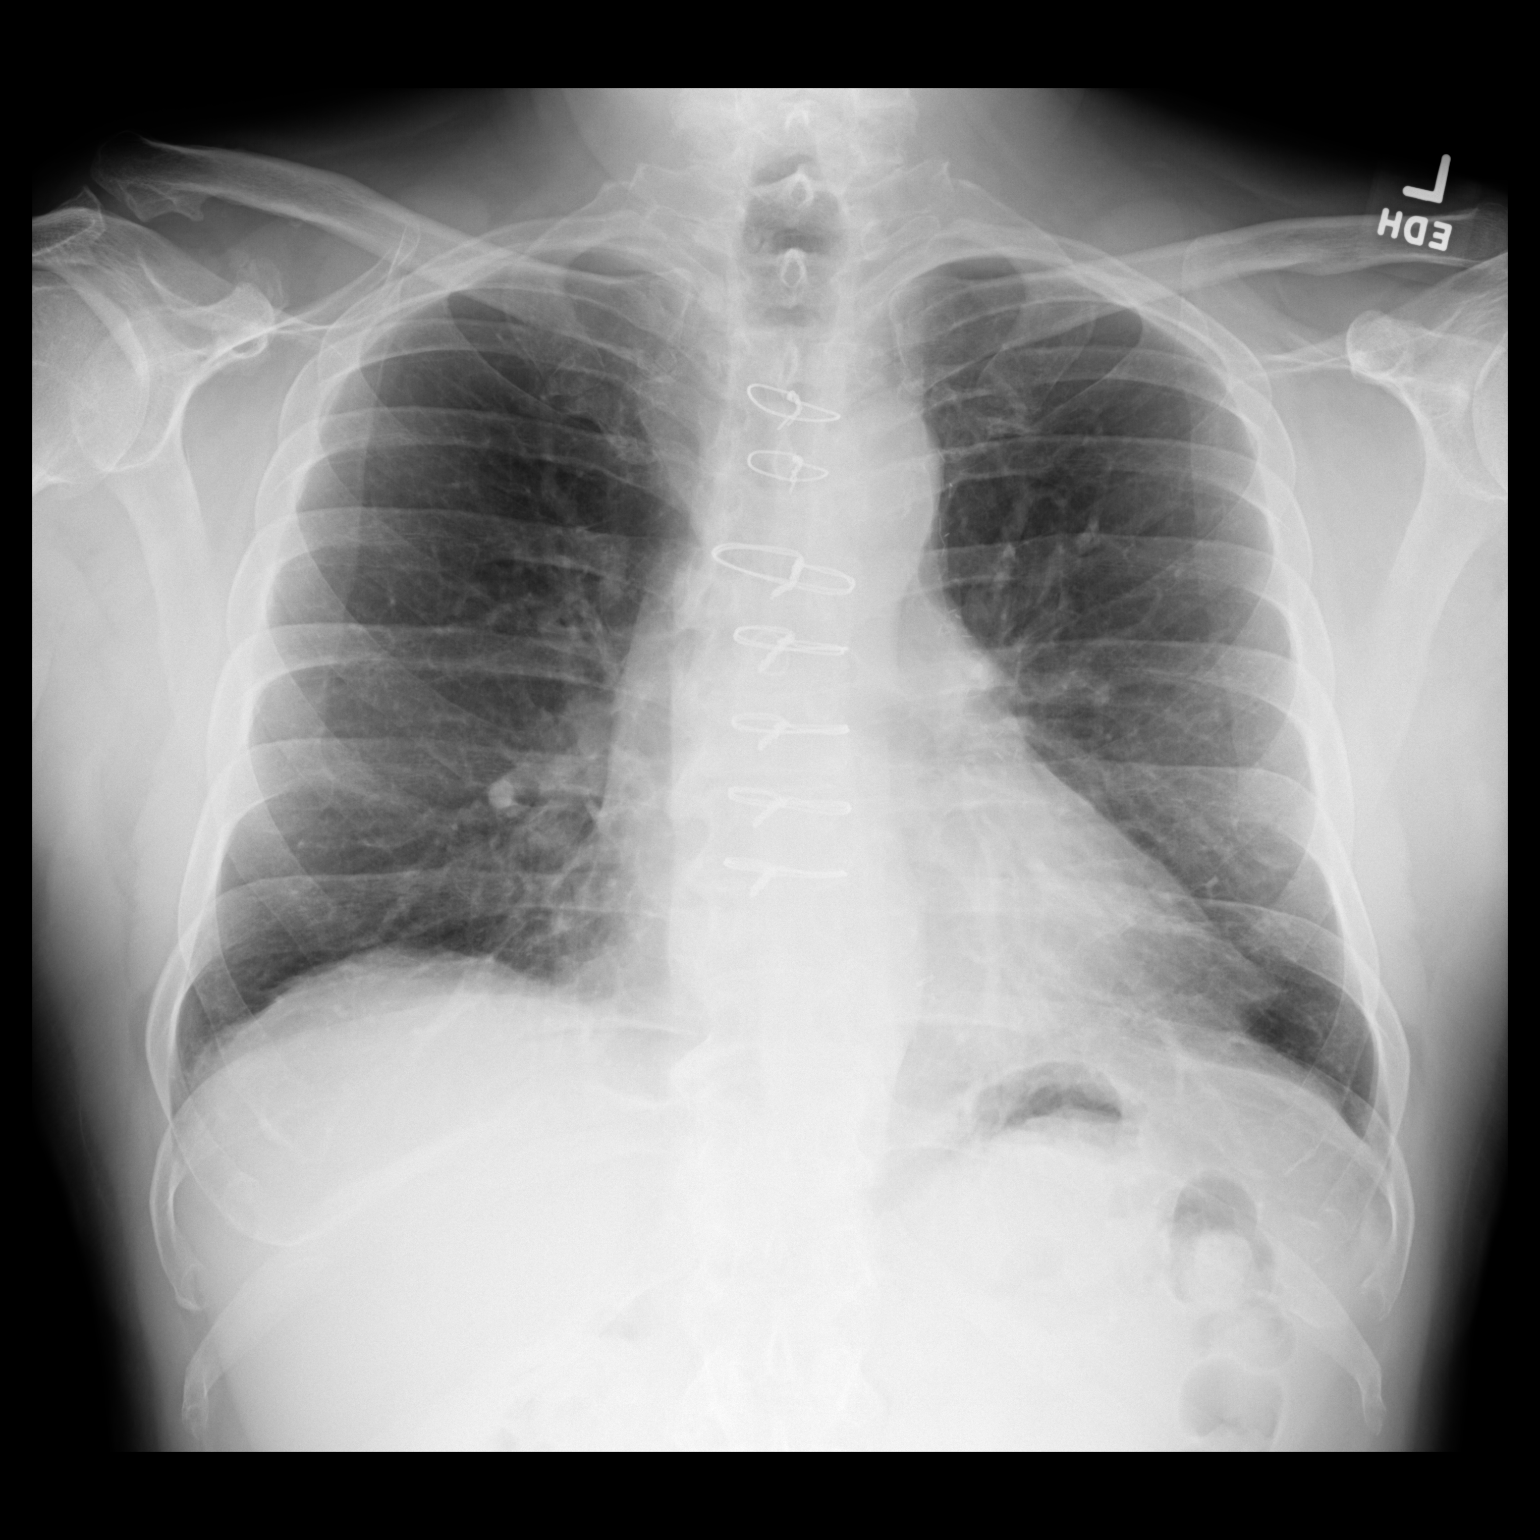

[dg chest 2 view (2 of 2)]
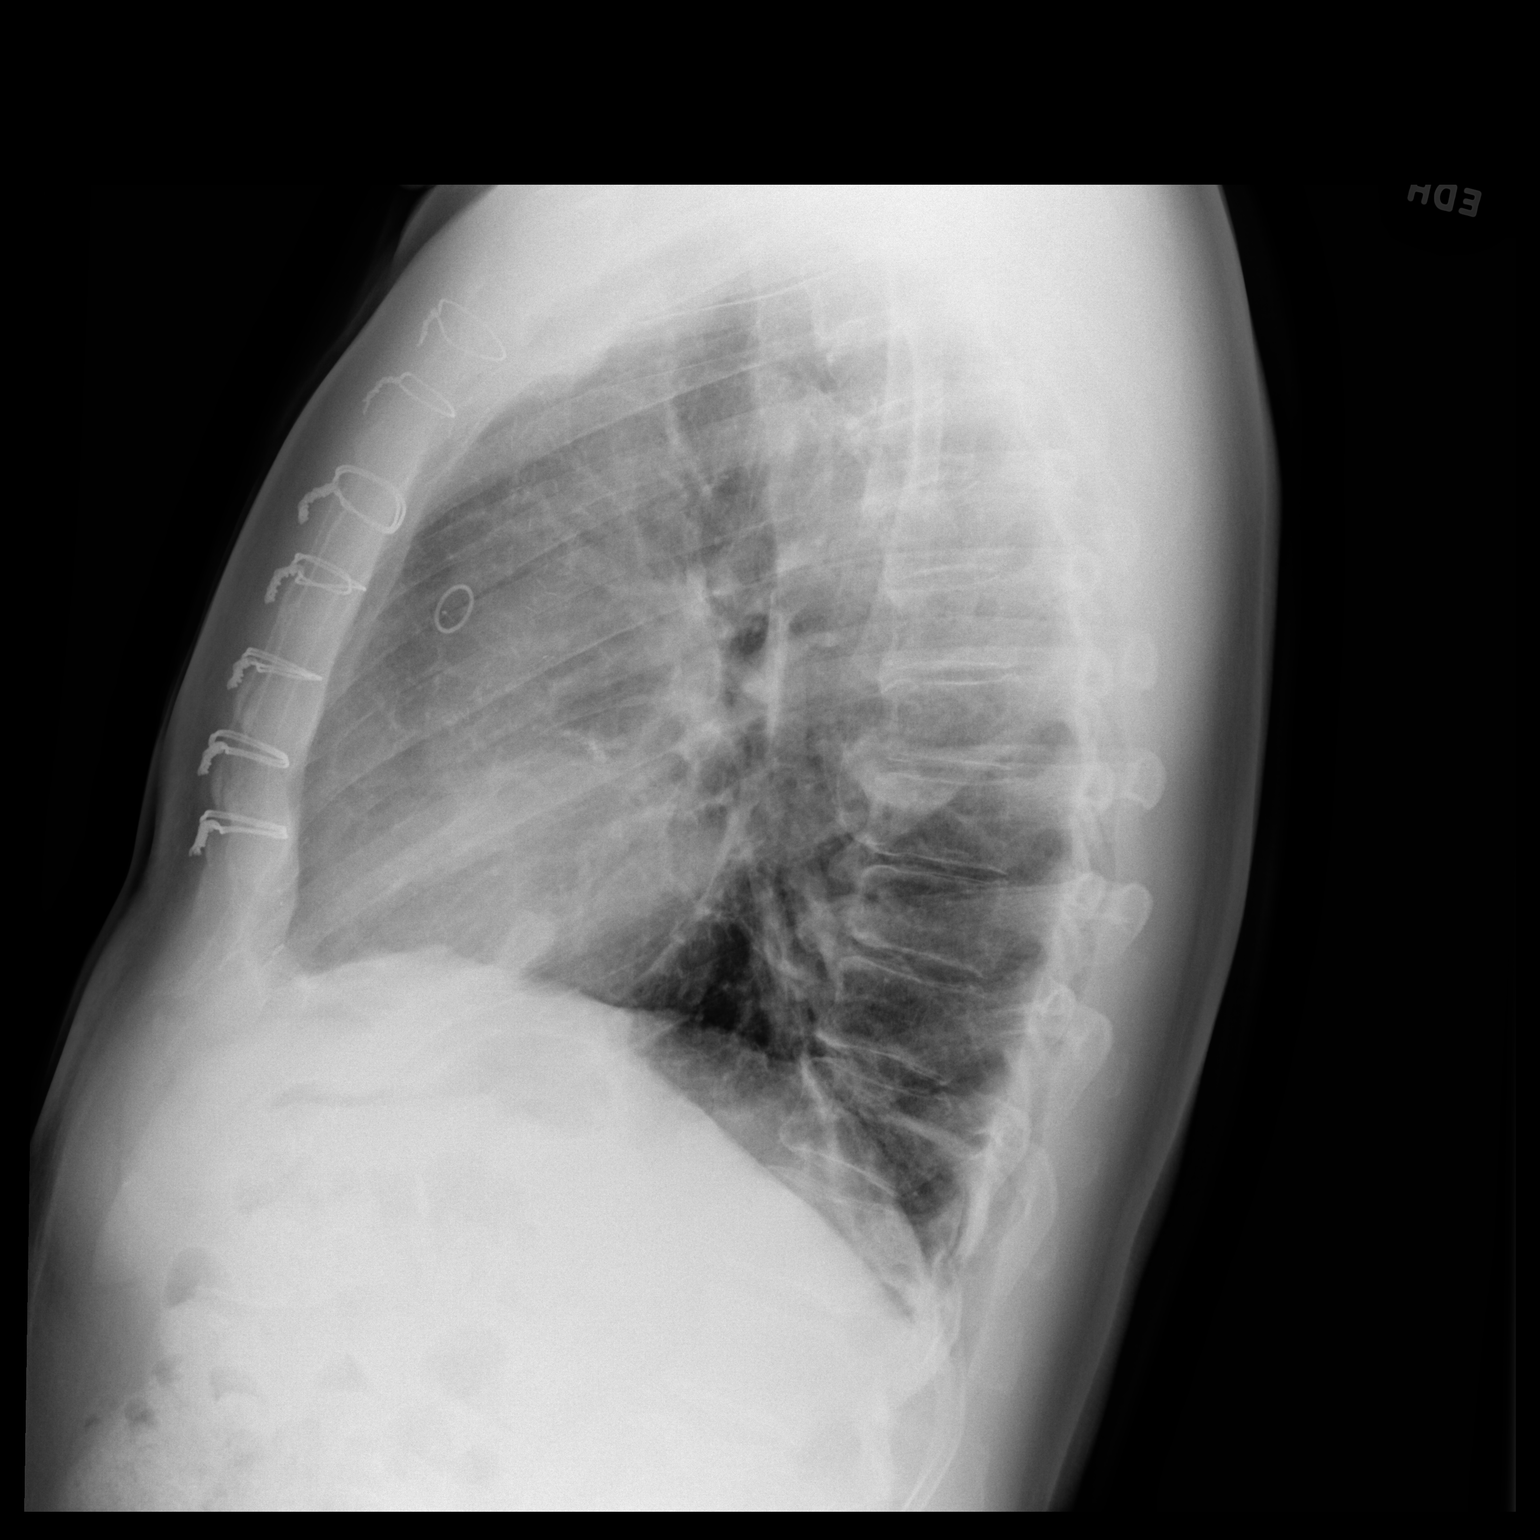

[2 of 2 positions shown; findings below may reference images not displayed]

FINDINGS: Median sternotomy for CABG. Midline trachea. Normal heart size and
mediastinal contours. No pleural effusion or pneumothorax. Resolved
left lung base subsegmental atelectasis.
IMPRESSION: No acute cardiopulmonary disease.

## 2021-01-25 DIAGNOSIS — J329 Chronic sinusitis, unspecified: Secondary | ICD-10-CM | POA: Diagnosis not present

## 2021-01-25 DIAGNOSIS — M1711 Unilateral primary osteoarthritis, right knee: Secondary | ICD-10-CM | POA: Diagnosis not present

## 2021-01-25 DIAGNOSIS — J40 Bronchitis, not specified as acute or chronic: Secondary | ICD-10-CM | POA: Diagnosis not present

## 2021-02-05 ENCOUNTER — Other Ambulatory Visit: Payer: Self-pay | Admitting: Cardiology

## 2021-08-05 ENCOUNTER — Telehealth: Payer: Self-pay | Admitting: Cardiology

## 2021-08-05 MED ORDER — LOSARTAN POTASSIUM 50 MG PO TABS: ORAL_TABLET | ORAL | 0 refills | Status: DC

## 2021-08-05 NOTE — Telephone Encounter (Signed)
*  STAT* If patient is at the pharmacy, call can be transferred to refill team.   1. Which medications need to be refilled? (please list name of each medication and dose if known)  losartan (COZAAR) 50 MG tablet  2. Which pharmacy/location (including street and city if local pharmacy) is medication to be sent to? [facility] - [place], [place] - [address]  3. Do they need a 30 day or 90 day supply? 90 day  Patient is out of medication.

## 2021-08-30 ENCOUNTER — Other Ambulatory Visit: Payer: Self-pay | Admitting: Cardiology

## 2021-09-07 DIAGNOSIS — G4733 Obstructive sleep apnea (adult) (pediatric): Secondary | ICD-10-CM | POA: Diagnosis not present

## 2021-10-14 DIAGNOSIS — T466X5A Adverse effect of antihyperlipidemic and antiarteriosclerotic drugs, initial encounter: Secondary | ICD-10-CM | POA: Diagnosis not present

## 2021-10-14 DIAGNOSIS — G720 Drug-induced myopathy: Secondary | ICD-10-CM | POA: Diagnosis not present

## 2021-10-14 DIAGNOSIS — E785 Hyperlipidemia, unspecified: Secondary | ICD-10-CM | POA: Diagnosis not present

## 2021-10-14 DIAGNOSIS — I25119 Atherosclerotic heart disease of native coronary artery with unspecified angina pectoris: Secondary | ICD-10-CM | POA: Diagnosis not present

## 2021-10-15 ENCOUNTER — Encounter: Payer: Self-pay | Admitting: Cardiology

## 2021-10-15 ENCOUNTER — Ambulatory Visit: Payer: Medicare Other | Attending: Cardiology | Admitting: Cardiology

## 2021-10-15 VITALS — BP 128/84 | HR 53 | Ht 69.0 in | Wt 172.2 lb

## 2021-10-15 DIAGNOSIS — I2510 Atherosclerotic heart disease of native coronary artery without angina pectoris: Secondary | ICD-10-CM

## 2021-10-15 DIAGNOSIS — I10 Essential (primary) hypertension: Secondary | ICD-10-CM | POA: Diagnosis not present

## 2021-10-15 DIAGNOSIS — E782 Mixed hyperlipidemia: Secondary | ICD-10-CM | POA: Diagnosis not present

## 2021-10-15 MED ORDER — EZETIMIBE 10 MG PO TABS: 10.0000 mg | ORAL_TABLET | Freq: Every day | ORAL | 3 refills | Status: DC

## 2021-10-15 MED ORDER — ATORVASTATIN CALCIUM 40 MG PO TABS: ORAL_TABLET | ORAL | 3 refills | Status: AC

## 2021-10-15 MED ORDER — AMLODIPINE BESYLATE 5 MG PO TABS: 5.0000 mg | ORAL_TABLET | Freq: Every day | ORAL | 3 refills | Status: DC

## 2021-10-15 MED ORDER — LOSARTAN POTASSIUM 50 MG PO TABS: ORAL_TABLET | ORAL | 3 refills | Status: DC

## 2021-10-15 MED ORDER — CARVEDILOL 6.25 MG PO TABS: 6.2500 mg | ORAL_TABLET | Freq: Two times a day (BID) | ORAL | 3 refills | Status: AC

## 2021-10-15 NOTE — Progress Notes (Signed)
Cardiology Office Note:    Date:  10/15/2021   ID:  Juan Payne, DOB 18-May-1955, MRN [id number]  PCP:  Street, Sharon Mt, MD  Cardiologist:  Berniece Salines, DO  Electrophysiologist:  None   Referring MD: Street, Sharon Mt, *   No chief complaint on file.   History of Present Illness:    Juan Payne is a 66 y.o. male with a hx of coronary artery disease status post CABG with LIMA to LAD, left radial artery to OM2 on May 14, 2019, hyperlipidemia, hypertension, postop A. fib  presents today for his post operative visit.   I did see the patient on May 07, 2019 at that time he was experiencing significant symptoms therefore I recommended he go to [facility] ED.  He was admitted to the hospital and underwent a nuclear stress test which was reported to be positive.  The patient was then transferred to [facility] where he underwent left heart catheterization showing multivessel disease.  Was recommended that he undergo coronary artery bypass grafting.  He successfully completed this surgery on May 14, 2019.  Postop stay was complicated by paroxysmal atrial fibrillation he was placed on amiodarone.   I did see the patient on Jun 04, 2019 at that time he was postop doing well, he was doing cardiac rehab he offered no complaints at that time no changes were made in his medications.     I saw the patient on August 08, 2019 at that time he was dizzy, had several stopped combination pill hydrochlorothiazide- losartan.  And only resume losartan at 50 mg daily.   I saw the patient on December 11, 2019 at that time he appeared to be doing well from a cardiovascular standpoint no changes were made.  I saw the patient on July 07, 2020 at that time we restarted his hydrochlorothiazide, continue the patient on losartan 50 mg twice a day with carvedilol 6.25 mg twice daily.  Since I saw the patient he presented to the emergency department on August 05, 2020 where he was  noted to have transient global amnesia.  Then today after he started experience chest discomfort.  He was subsequently hospitalized at [facility] where he has slight high-sensitivity troponin elevation.  His echo did not show any change in his ejection fraction and his nuclear stress test did not show any evidence of ischemia.  His antihypertensive medication were optimized and he was asked to follow-up with me.  Of note the patient wife has been under treatment for breast cancer and the day of his event she had significant reaction to her chemotherapy.   Since his last visit he has lost his wife-   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   LIMA to LAD LEFT RADIAL ARTERY to OM2    Unstable angina ([place]) 05/11/2019    Past Surgical History:  Procedure Laterality Date   CORONARY ARTERY BYPASS GRAFT N/A 05/14/2019   Procedure: CORONARY ARTERY BYPASS GRAFTING (CABG) x TWO USING LEFT INTERNAL MAMMARY ARTERY AND LEFT RADIAL ARTERY;  Surgeon: Ivin Poot, MD;  Location: [place];  Service: Open  Heart Surgery;  Laterality: N/A;   LEFT HEART CATH AND CORONARY ANGIOGRAPHY N/A 05/13/2019   Procedure: LEFT HEART CATH AND CORONARY ANGIOGRAPHY;  Surgeon: Lorretta Harp, MD;  Location: [place] [facility];  Service: Cardiovascular;  Laterality: N/A;   RADIAL ARTERY HARVEST Left 05/14/2019   Procedure: RADIAL ARTERY HARVEST;  Surgeon: Ivin Poot, MD;  Location: [place];  Service: Open Heart Surgery;  Laterality: Left;   TEE WITHOUT CARDIOVERSION N/A 05/14/2019   Procedure: TRANSESOPHAGEAL ECHOCARDIOGRAM (TEE);  Surgeon: Prescott Gum, Collier Salina, MD;  Location: [place];   Service: Open Heart Surgery;  Laterality: N/A;    Current Medications: Current Meds  Medication Sig   aspirin EC 81 MG tablet Take 81 mg by mouth daily. Swallow whole.   citalopram (CELEXA) 20 MG tablet Take 20 mg by mouth at bedtime.    LORazepam (ATIVAN) 0.5 MG tablet Take 0.5 mg by mouth every 8 (eight) hours as needed for sleep.   nitroGLYCERIN (NITROSTAT) 0.4 MG SL tablet Place 1 tablet (0.4 mg total) under the tongue every 5 (five) minutes as needed for chest pain.   [DISCONTINUED] amLODipine (NORVASC) 5 MG tablet TAKE 1 TABLET BY MOUTH DAILY   [DISCONTINUED] atorvastatin (LIPITOR) 40 MG tablet TAKE 1 TABLET BY MOUTH DAILY AT 6 PM.   [DISCONTINUED] carvedilol (COREG) 6.25 MG tablet TAKE 1 TABLET BY MOUTH 2 TIMES DAILY.   [DISCONTINUED] ezetimibe (ZETIA) 10 MG tablet TAKE 1 TABLET BY MOUTH DAILY   [DISCONTINUED] losartan (COZAAR) 50 MG tablet TAKE 1 TABLET BY MOUTH IN THE MORNING THEN TAKE 1/2 TABLET IN THE EVENING.     Allergies:   Morphine and related   Social History   Socioeconomic History   Marital status: Single    Spouse name: Not on file   Number of children: Not on file   Years of education: Not on file   Highest education level: Not on file  Occupational History   Not on file  Tobacco Use   Smoking status: Never   Smokeless tobacco: Never  Substance and Sexual Activity   Alcohol use: Not Currently   Drug use: Never   Sexual activity: Not on file  Other Topics Concern   Not on file  Social History Narrative   ** Merged History Encounter **       Social Determinants of Health   Financial Resource Strain: Not on file  Food Insecurity: Not on file  Transportation Needs: Not on file  Physical Activity: Not on file  Stress: Not on file  Social Connections: Not on file     Family History: The patient's family history includes Heart attack in his brother and father; Hypertension in his mother.  ROS:   Review of Systems  Constitution: Negative for  decreased appetite, fever and weight gain.  HENT: Negative for congestion, ear discharge, hoarse voice and sore throat.   Eyes: Negative for discharge, redness, vision loss in right eye and visual halos.  Cardiovascular: Negative for chest pain, dyspnea on exertion, leg swelling, orthopnea and palpitations.  Respiratory: Negative for cough, hemoptysis, shortness of breath and snoring.   Endocrine: Negative for heat intolerance and polyphagia.  Hematologic/Lymphatic: Negative for bleeding problem. Does not bruise/bleed easily.  Skin: Negative for flushing, nail changes, rash and suspicious lesions.  Musculoskeletal: Negative for arthritis, joint pain, muscle cramps, myalgias, neck pain and stiffness.  Gastrointestinal: Negative for abdominal pain, bowel incontinence, diarrhea and excessive appetite.  Genitourinary: Negative for decreased libido, genital sores and incomplete emptying.  Neurological: Negative for brief paralysis, focal weakness, headaches and loss of balance.  Psychiatric/Behavioral: Negative for altered mental status, depression and suicidal ideas.  Allergic/Immunologic: Negative for HIV exposure and persistent infections.    EKGs/Labs/Other Studies Reviewed:    The following studies were reviewed today:   EKG: None today  Lexiscan done on August 07, 2020  Narrative & Impression    There was no ST segment deviation noted during stress. No T wave inversion was noted during stress. No change from start; V5/V6 improved Findings consistent with prior myocardial infarction. This is an intermediate risk study. The left ventricular ejection fraction is normal (55-65%). Nuclear stress EF: 56%.   1. There is a small (~6-8% of LV), mild fixed perfusion defect present in the mid inferolateral and apical lateral segments.  This appears to be consistent with prior posterior wall MI.   2. There are no reversible perfusion defects.   3. Hypokinesis of the inferolateral wall  noted. 4. Normal LVEF, 56%. 5. Overall, this is an intermediate risk study based on evidence of prior infarction.    TTE 08/07/2020 IMPRESSIONS   1. Hypokinesis of the mid to apical posterolateral myocardium. Left  ventricular ejection fraction, by estimation, is 45 to 50%. The left  ventricle has mildly decreased function. The left ventricle demonstrates  regional wall motion abnormalities (see  scoring diagram/findings for description). There is mild asymmetric left  ventricular hypertrophy of the basal-septal segment. Left ventricular  diastolic parameters are consistent with Grade I diastolic dysfunction  (impaired relaxation).   2. Right ventricular systolic function is normal. The right ventricular  size is normal. There is normal pulmonary artery systolic pressure.   3. The mitral valve is normal in structure. Trivial mitral valve  regurgitation. No evidence of mitral stenosis.   4. The aortic valve is tricuspid. Aortic valve regurgitation is trivial.  No aortic stenosis is present.   5. Aortic dilatation noted. There is borderline dilatation of the  ascending aorta, measuring 36 mm.   6. The inferior vena cava is normal in size with greater than 50%  respiratory variability, suggesting right atrial pressure of 3 mmHg.   FINDINGS   Left Ventricle: Hypokinesis of the mid to apical posterolateral  myocardium. Left ventricular ejection fraction, by estimation, is 45 to  50%. The left ventricle has mildly decreased function. The left ventricle  demonstrates regional wall motion  abnormalities. Definity contrast agent was given IV to delineate the left  ventricular endocardial borders. The left ventricular internal cavity size  was normal in size. There is mild asymmetric left ventricular hypertrophy  of the basal-septal segment.  Left ventricular diastolic parameters are consistent with Grade I  diastolic dysfunction (impaired relaxation).   Right Ventricle: The right  ventricular size is normal. No increase in  right ventricular wall thickness. Right ventricular systolic function is  normal. There is normal pulmonary artery systolic pressure. The tricuspid  regurgitant velocity is 2.43 m/s, and   with an assumed right atrial pressure of 3 mmHg, the estimated right  ventricular systolic pressure is 123XX123 mmHg.   Left Atrium: Left atrial size was normal in size.   Right Atrium: Right atrial size was normal in size.   Pericardium: There is no evidence of pericardial effusion.   Mitral Valve: The mitral valve is normal in structure. Trivial mitral  valve regurgitation. No evidence of mitral valve stenosis.   Tricuspid Valve: The tricuspid valve is normal in structure. Tricuspid  valve regurgitation is trivial. No evidence of  tricuspid stenosis.   Aortic Valve: The aortic valve is tricuspid. Aortic valve regurgitation is  trivial. No aortic stenosis is present.   Pulmonic Valve: The pulmonic valve was normal in structure. Pulmonic valve  regurgitation is not visualized. No evidence of pulmonic stenosis.   Aorta: Aortic dilatation noted. There is borderline dilatation of the  ascending aorta, measuring 36 mm.   Venous: The inferior vena cava is normal in size with greater than 50%  respiratory variability, suggesting right atrial pressure of 3 mmHg.   IAS/Shunts: No atrial level shunt detected by color flow Doppler  Recent Labs: No results found for requested labs within last 365 days.  Recent Lipid Panel    Component Value Date/Time   CHOL 145 07/10/2020 0838   TRIG 69 07/10/2020 0838   HDL 65 07/10/2020 0838   CHOLHDL 2.2 07/10/2020 0838   CHOLHDL 3.7 05/12/2019 0216   VLDL 27 05/12/2019 0216   LDLCALC 66 07/10/2020 0838    Physical Exam:    VS:  BP 128/84   Pulse (!) 53   Ht 5\' 9"  (1.753 m)   Wt 172 lb 3.2 oz (78.1 kg)   SpO2 97%   BMI 25.43 kg/m     Wt Readings from Last 3 Encounters:  10/15/21 172 lb 3.2 oz (78.1 kg)   12/01/20 169 lb 6.4 oz (76.8 kg)  08/25/20 171 lb (77.6 kg)     GEN: Well nourished, well developed in no acute distress HEENT: Normal NECK: No JVD; No carotid bruits LYMPHATICS: No lymphadenopathy CARDIAC: S1S2 noted,RRR, no murmurs, rubs, gallops RESPIRATORY:  Clear to auscultation without rales, wheezing or rhonchi  ABDOMEN: Soft, non-tender, non-distended, +bowel sounds, no guarding. EXTREMITIES: No edema, No cyanosis, no clubbing MUSCULOSKELETAL:  No deformity  SKIN: Warm and dry NEUROLOGIC:  Alert and oriented x 3, non-focal PSYCHIATRIC:  Normal affect, good insight  ASSESSMENT:    1. Coronary artery disease involving native coronary artery of native heart without angina pectoris   2. Essential hypertension   3. Mixed hyperlipidemia    PLAN:    The patient had questions about his lab work that was done in the hospital and we were able to review with both together and questions were answered.  He has not had any anginal related pain.  I have educated patient on what to look for.  And also educated him on how to take his nitroglycerin.  I have asked the patient if he finds himself taking nitroglycerin on a daily basis to notify my office as we will consider long acting nitrates if that is the case.  Thankfully his blood pressure is at goal.  We will continue his current dose of amlodipine 5 mg daily, carvedilol 6.25 mg twice daily and losartan 50 mg twice daily. Continue his aspirin 81 mg daily along with his atorvastatin and Zetia.  We talked about his upcoming stress with his wife giving the diagnosis and treatment.  He is on Celexa as well as as needed Xanax from his primary care provider.    We will get blood work today for [employer].  The patient is in agreement with the above plan. The patient left the office in stable condition.  The patient will follow up in 3 months or sooner if needed.   Medication Adjustments/Labs and Tests Ordered: Current medicines are reviewed at  length with the patient today.  Concerns regarding medicines are outlined above.  Orders Placed This Encounter  Procedures   EKG 12-Lead  Meds ordered this encounter  Medications   amLODipine (NORVASC) 5 MG tablet    Sig: Take 1 tablet (5 mg total) by mouth daily.    Dispense:  90 tablet    Refill:  3   atorvastatin (LIPITOR) 40 MG tablet    Sig: TAKE 1 TABLET BY MOUTH DAILY AT 6 PM.    Dispense:  90 tablet    Refill:  3   carvedilol (COREG) 6.25 MG tablet    Sig: Take 1 tablet (6.25 mg total) by mouth 2 (two) times daily.    Dispense:  180 tablet    Refill:  3   ezetimibe (ZETIA) 10 MG tablet    Sig: Take 1 tablet (10 mg total) by mouth daily.    Dispense:  90 tablet    Refill:  3   losartan (COZAAR) 50 MG tablet    Sig: TAKE 1 TABLET BY MOUTH IN THE MORNING THEN TAKE 1/2 TABLET IN THE EVENING.    Dispense:  135 tablet    Refill:  3     Patient Instructions  Medication Instructions:  Your physician recommends that you continue on your current medications as directed. Please refer to the Current Medication list given to you today.  *If you need a refill on your cardiac medications before your next appointment, please call your pharmacy*   Lab Work: None    Testing/Procedures: None   Follow-Up: At [facility], you and your health needs are our priority.  As part of our continuing mission to provide you with exceptional heart care, we have created designated Provider Care Teams.  These Care Teams include your primary Cardiologist (physician) and Advanced Practice Providers (APPs -  Physician Assistants and Nurse Practitioners) who all work together to provide you with the care you need, when you need it.  We recommend signing up for the patient portal called "MyChart".  Sign up information is provided on this After Visit Summary.  MyChart is used to connect with patients for Virtual Visits (Telemedicine).  Patients are able to view lab/test results, encounter  notes, upcoming appointments, etc.  Non-urgent messages can be sent to your provider as well.   To learn more about what you can do with MyChart, go to [email or web address].    Your next appointment:   1 year(s)  The format for your next appointment:   In Person  Provider:   Berniece Salines, DO     Other Instructions   Important Information About Sugar         Adopting a Healthy Lifestyle.  Know what a healthy weight is for you (roughly BMI <25) and aim to maintain this   Aim for 7+ servings of fruits and vegetables daily   65-80+ fluid ounces of water or unsweet tea for healthy kidneys   Limit to max 1 drink of alcohol per day; avoid smoking/tobacco   Limit animal fats in diet for cholesterol and heart health - choose grass fed whenever available   Avoid highly processed foods, and foods high in saturated/trans fats   Aim for low stress - take time to unwind and care for your mental health   Aim for 150 min of moderate intensity exercise weekly for heart health, and weights twice weekly for bone health   Aim for 7-9 hours of sleep daily   When it comes to diets, agreement about the perfect plan isnt easy to find, even among the experts. Experts at the [employer] developed  an idea known as the Healthy Eating Plate. Just imagine a plate divided into logical, healthy portions.   The emphasis is on diet quality:   Load up on vegetables and fruits - one-half of your plate: Aim for color and variety, and remember that potatoes dont count.   Go for whole grains - one-quarter of your plate: Whole wheat, barley, wheat berries, quinoa, oats, brown rice, and foods made with them. If you want pasta, go with whole wheat pasta.   Protein power - one-quarter of your plate: Fish, chicken, beans, and nuts are all healthy, versatile protein sources. Limit red meat.   The diet, however, does go beyond the plate, offering a few other suggestions.   Use  healthy plant oils, such as olive, canola, soy, corn, sunflower and peanut. Check the labels, and avoid partially hydrogenated oil, which have unhealthy trans fats.   If youre thirsty, drink water. Coffee and tea are good in moderation, but skip sugary drinks and limit milk and dairy products to one or two daily servings.   The type of carbohydrate in the diet is more important than the amount. Some sources of carbohydrates, such as vegetables, fruits, whole grains, and beans-are healthier than others.   Finally, stay active  Signed, Berniece Salines, DO  10/15/2021 10:56 PM    [facility]

## 2021-10-15 NOTE — Patient Instructions (Signed)
Medication Instructions:  Your physician recommends that you continue on your current medications as directed. Please refer to the Current Medication list given to you today.  *If you need a refill on your cardiac medications before your next appointment, please call your pharmacy*   Lab Work: None   Testing/Procedures: None   Follow-Up: At [facility], you and your health needs are our priority.  As part of our continuing mission to provide you with exceptional heart care, we have created designated Provider Care Teams.  These Care Teams include your primary Cardiologist (physician) and Advanced Practice Providers (APPs -  Physician Assistants and Nurse Practitioners) who all work together to provide you with the care you need, when you need it.  We recommend signing up for the patient portal called "MyChart".  Sign up information is provided on this After Visit Summary.  MyChart is used to connect with patients for Virtual Visits (Telemedicine).  Patients are able to view lab/test results, encounter notes, upcoming appointments, etc.  Non-urgent messages can be sent to your provider as well.   To learn more about what you can do with MyChart, go to [email or web address].    Your next appointment:   1 year(s)  The format for your next appointment:   In Person  Provider:   Kardie Tobb, DO     Other Instructions   Important Information About Sugar       

## 2021-12-08 DIAGNOSIS — G4733 Obstructive sleep apnea (adult) (pediatric): Secondary | ICD-10-CM | POA: Diagnosis not present

## 2022-02-21 IMAGING — CT CT HEAD W/O CM
4 series · 16 of 47 positions shown, 18 images · non-contrast
Comparison: Head CT 09/06/2019 report, images not retrievable at
the time of this exam

CLINICAL DATA: Neuro deficit, acute, stroke suspected

EXAM:
CT HEAD WITHOUT CONTRAST
TECHNIQUE: Contiguous axial images were obtained from the base of the skull
through the vertex without intravenous contrast.

[Series 3: head wo · axial · 0.47mm/px · z∈[-102,+18]mm · 7 of 34 slices shown, 9 images]
[im 5/34  brain]
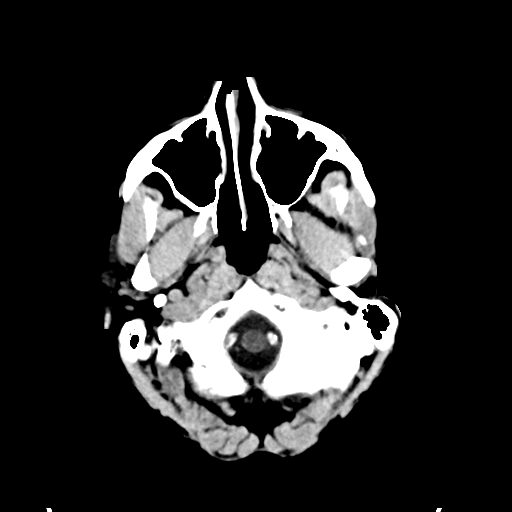
[im 5/34  bone]
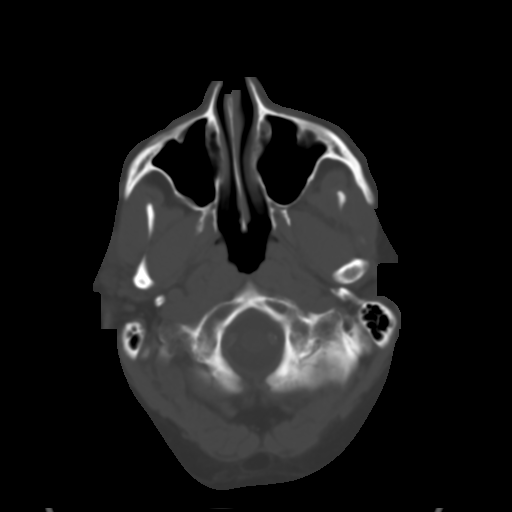
[im 9/34  brain]
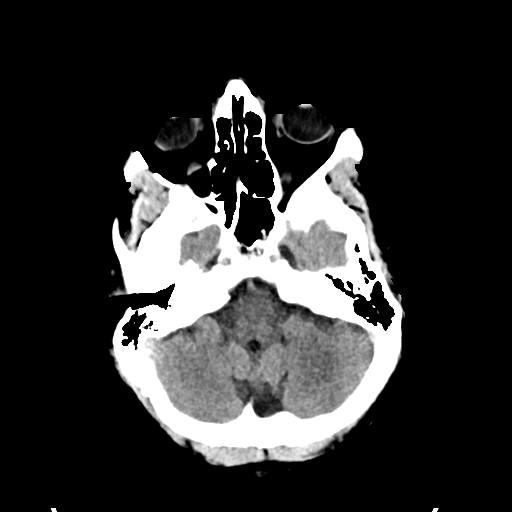
[im 13/34  brain]
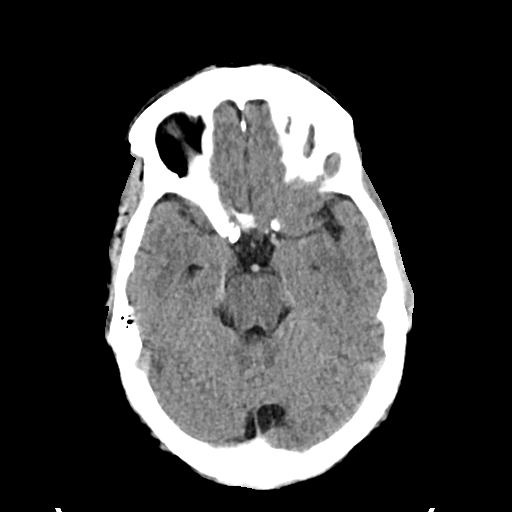
[im 17/34  brain]
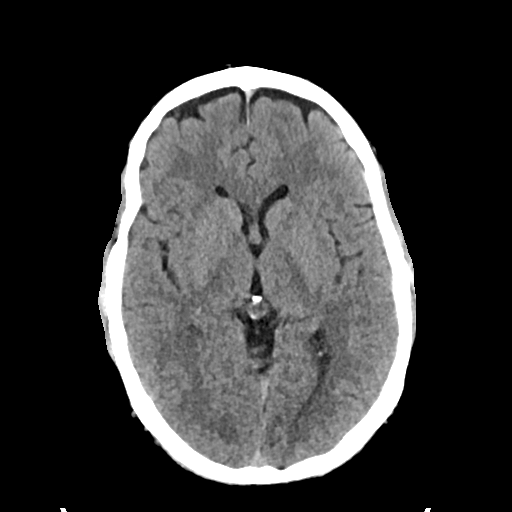
[im 21/34  brain]
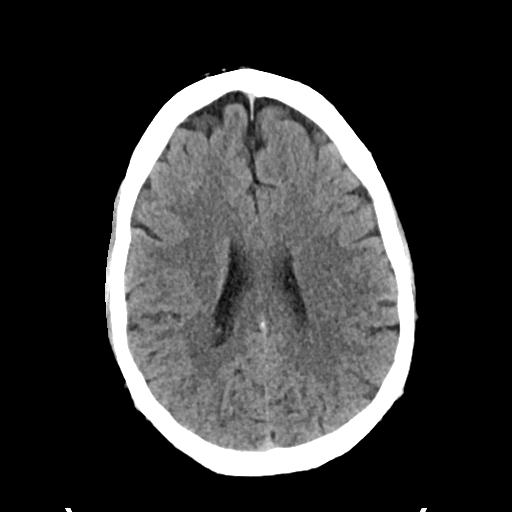
[im 21/34  bone]
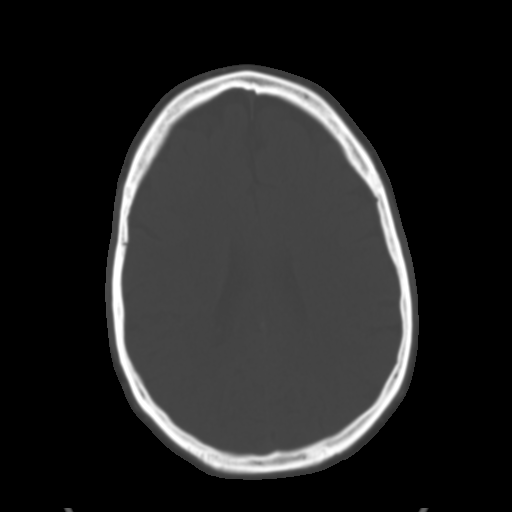
[im 25/34  brain]
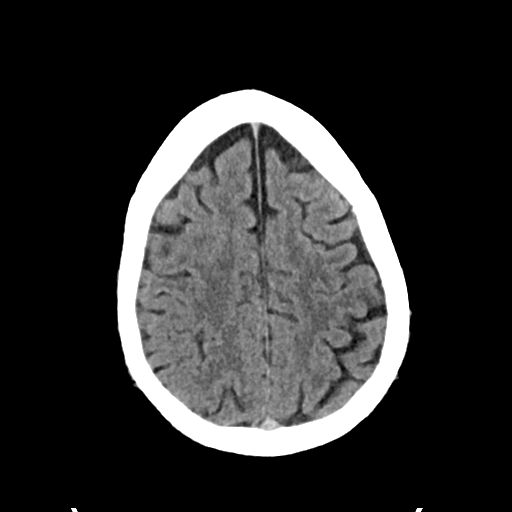
[im 29/34  brain]
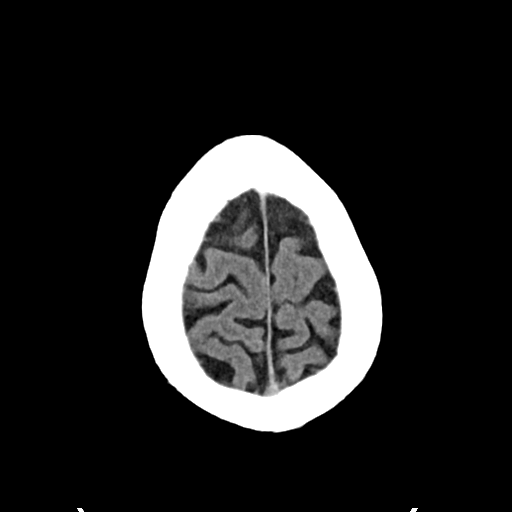

[Series 4: head bone · axial · 0.47mm/px · z∈[-106,-74]mm · 3 of 84 slices shown]
[im 9/84  bone]
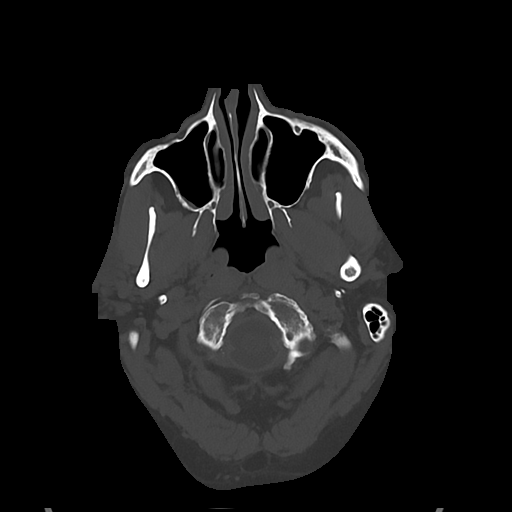
[im 17/84  bone]
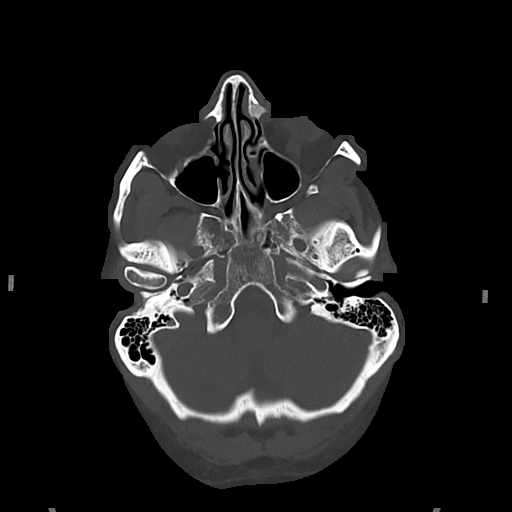
[im 25/84  bone]
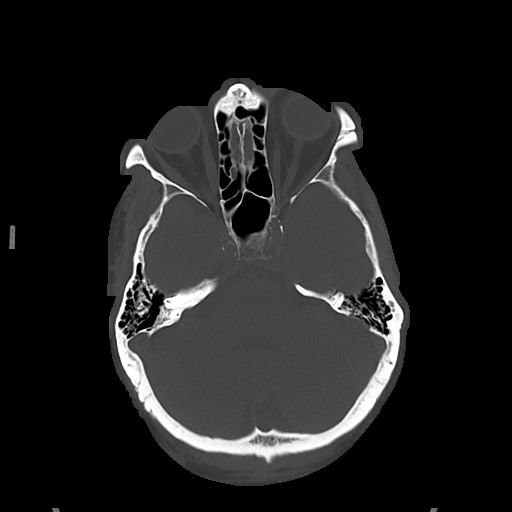

[Series 5: cor soft · coronal · 0.35mm/px · 3 of 76 slices shown]
[im 26/76  brain]
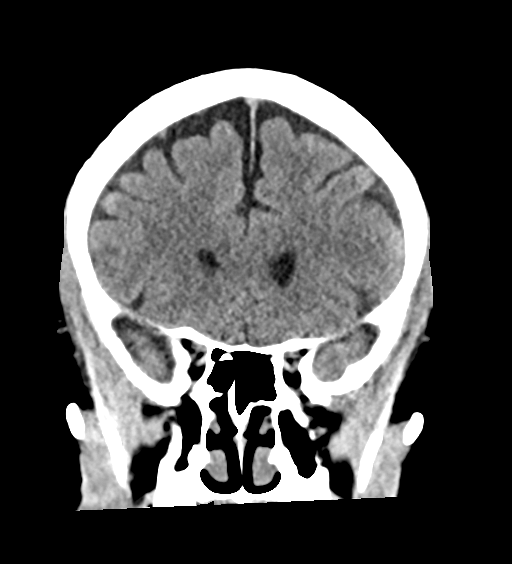
[im 34/76  brain]
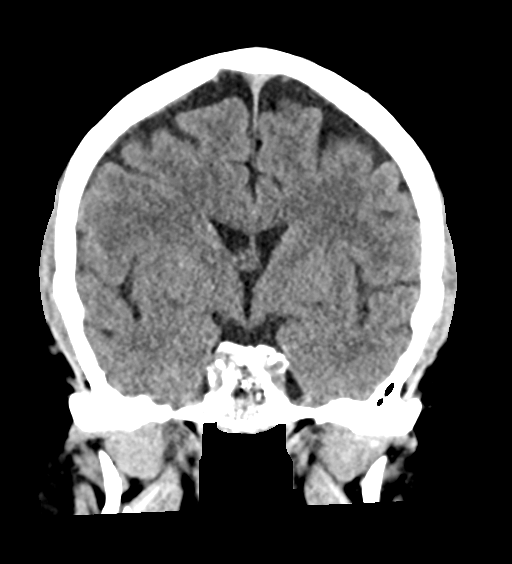
[im 42/76  brain]
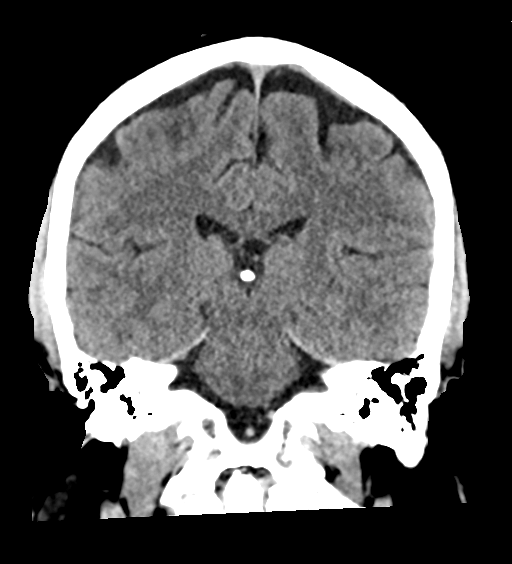

[Series 6: sag soft · sagittal · 0.38mm/px · 3 of 60 slices shown]
[im 20/60  brain]
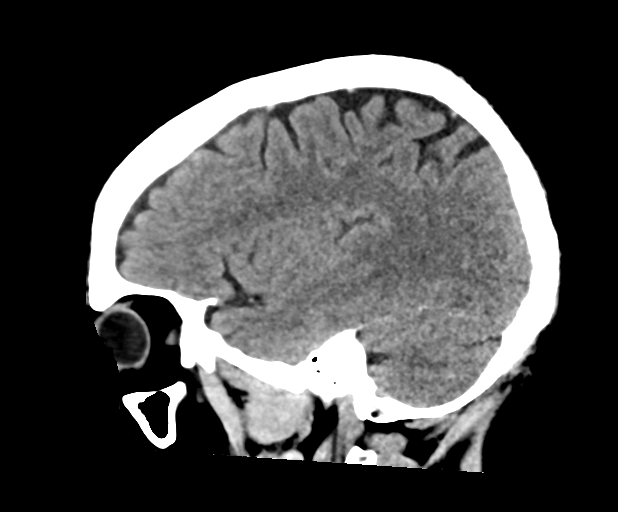
[im 30/60  brain]
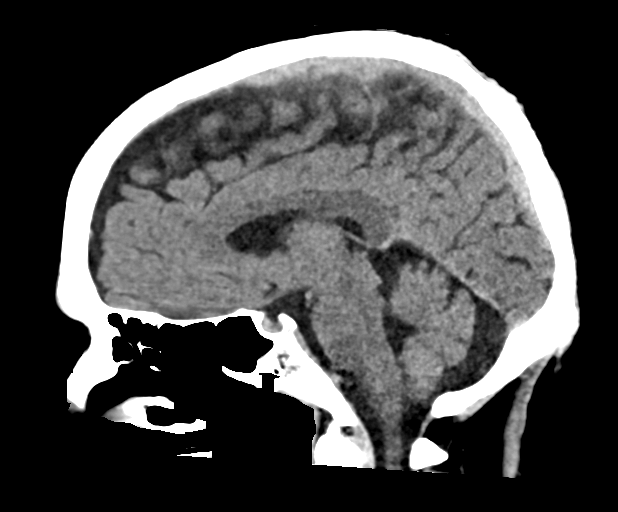
[im 40/60  brain]
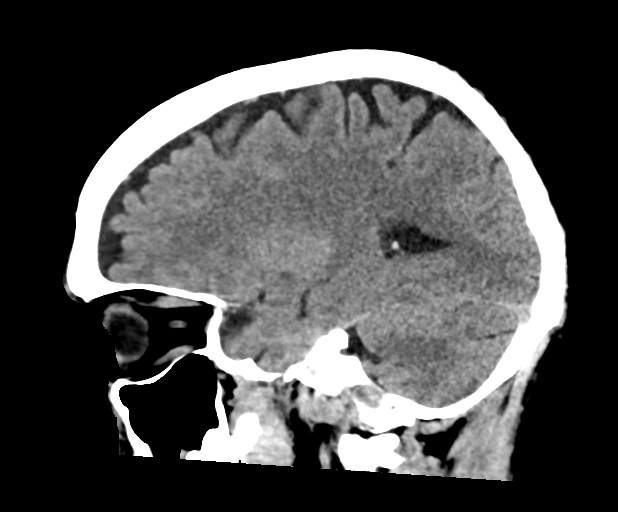

[16 of 47 positions shown; findings below may reference images not displayed]

FINDINGS: Brain: No evidence of acute intracranial hemorrhage. Left frontal
lobe white matter hypodensities, similar in description to prior CT
report in August 2019. There are no new areas of focal
hypoattenuation or loss of gray-white matter differentiation. There
is no extra-axial collection.

Vascular: No hyperdense vessel or unexpected calcification.

Skull: Normal. Negative for fracture or focal lesion.

Sinuses/Orbits: Scattered paranasal sinus mucosal thickening.

Other: None.
IMPRESSION: No acute intracranial abnormality. Left frontal white matter
hypodensities, likely representing chronic small vessel ischemic
disease. If there is concern for acute infarct, recommend MRI.

## 2022-03-10 DIAGNOSIS — G4733 Obstructive sleep apnea (adult) (pediatric): Secondary | ICD-10-CM | POA: Diagnosis not present

## 2022-06-09 DIAGNOSIS — E785 Hyperlipidemia, unspecified: Secondary | ICD-10-CM | POA: Diagnosis not present

## 2022-06-09 DIAGNOSIS — I10 Essential (primary) hypertension: Secondary | ICD-10-CM | POA: Diagnosis not present

## 2022-06-09 DIAGNOSIS — I2510 Atherosclerotic heart disease of native coronary artery without angina pectoris: Secondary | ICD-10-CM | POA: Diagnosis not present

## 2022-06-09 DIAGNOSIS — F419 Anxiety disorder, unspecified: Secondary | ICD-10-CM | POA: Diagnosis not present

## 2022-06-13 DIAGNOSIS — G4733 Obstructive sleep apnea (adult) (pediatric): Secondary | ICD-10-CM | POA: Diagnosis not present

## 2022-07-20 DIAGNOSIS — F419 Anxiety disorder, unspecified: Secondary | ICD-10-CM | POA: Diagnosis not present

## 2022-09-13 DIAGNOSIS — G4733 Obstructive sleep apnea (adult) (pediatric): Secondary | ICD-10-CM | POA: Diagnosis not present

## 2022-10-11 DIAGNOSIS — E785 Hyperlipidemia, unspecified: Secondary | ICD-10-CM | POA: Diagnosis not present

## 2022-10-11 DIAGNOSIS — I25119 Atherosclerotic heart disease of native coronary artery with unspecified angina pectoris: Secondary | ICD-10-CM | POA: Diagnosis not present

## 2022-10-11 DIAGNOSIS — Z0000 Encounter for general adult medical examination without abnormal findings: Secondary | ICD-10-CM | POA: Diagnosis not present

## 2022-10-11 DIAGNOSIS — I77810 Thoracic aortic ectasia: Secondary | ICD-10-CM | POA: Diagnosis not present

## 2022-10-11 DIAGNOSIS — Z79899 Other long term (current) drug therapy: Secondary | ICD-10-CM | POA: Diagnosis not present

## 2022-10-11 DIAGNOSIS — E162 Hypoglycemia, unspecified: Secondary | ICD-10-CM | POA: Diagnosis not present

## 2022-10-11 DIAGNOSIS — R351 Nocturia: Secondary | ICD-10-CM | POA: Diagnosis not present

## 2022-10-11 DIAGNOSIS — N401 Enlarged prostate with lower urinary tract symptoms: Secondary | ICD-10-CM | POA: Diagnosis not present

## 2022-10-11 DIAGNOSIS — Z23 Encounter for immunization: Secondary | ICD-10-CM | POA: Diagnosis not present

## 2022-10-11 DIAGNOSIS — I5042 Chronic combined systolic (congestive) and diastolic (congestive) heart failure: Secondary | ICD-10-CM | POA: Diagnosis not present

## 2022-10-11 DIAGNOSIS — I110 Hypertensive heart disease with heart failure: Secondary | ICD-10-CM | POA: Diagnosis not present

## 2022-10-27 ENCOUNTER — Encounter: Payer: Self-pay | Admitting: Cardiology

## 2022-10-27 ENCOUNTER — Ambulatory Visit: Payer: Medicare Other | Attending: Cardiology | Admitting: Cardiology

## 2022-10-27 VITALS — BP 116/64 | HR 62 | Ht 69.0 in | Wt 177.0 lb

## 2022-10-27 DIAGNOSIS — Z951 Presence of aortocoronary bypass graft: Secondary | ICD-10-CM | POA: Diagnosis not present

## 2022-10-27 DIAGNOSIS — E782 Mixed hyperlipidemia: Secondary | ICD-10-CM

## 2022-10-27 DIAGNOSIS — I2510 Atherosclerotic heart disease of native coronary artery without angina pectoris: Secondary | ICD-10-CM

## 2022-10-27 DIAGNOSIS — I480 Paroxysmal atrial fibrillation: Secondary | ICD-10-CM

## 2022-10-27 DIAGNOSIS — I10 Essential (primary) hypertension: Secondary | ICD-10-CM

## 2022-10-27 MED ORDER — LOSARTAN POTASSIUM 50 MG PO TABS: ORAL_TABLET | ORAL | 3 refills | Status: DC

## 2022-10-27 MED ORDER — AMLODIPINE BESYLATE 5 MG PO TABS: 5.0000 mg | ORAL_TABLET | Freq: Every day | ORAL | 3 refills | Status: DC

## 2022-10-27 MED ORDER — EZETIMIBE 10 MG PO TABS: 10.0000 mg | ORAL_TABLET | Freq: Every day | ORAL | 3 refills | Status: DC

## 2022-10-27 NOTE — Patient Instructions (Signed)
Medication Instructions:  NO CHANGES  *If you need a refill on your cardiac medications before your next appointment, please call your pharmacy*    Follow-Up: At [facility], you and your health needs are our priority.  As part of our continuing mission to provide you with exceptional heart care, we have created designated Provider Care Teams.  These Care Teams include your primary Cardiologist (physician) and Advanced Practice Providers (APPs -  Physician Assistants and Nurse Practitioners) who all work together to provide you with the care you need, when you need it.  We recommend signing up for the patient portal called "MyChart".  Sign up information is provided on this After Visit Summary.  MyChart is used to connect with patients for Virtual Visits (Telemedicine).  Patients are able to view lab/test results, encounter notes, upcoming appointments, etc.  Non-urgent messages can be sent to your provider as well.   To learn more about what you can do with MyChart, go to [email or web address].    Your next appointment:   12 months with Dr. Servando Salina

## 2022-10-27 NOTE — Progress Notes (Signed)
Cardiology Office Note:    Date:  10/28/2022   ID:  Juan Payne, DOB 12/04/1955, MRN [id number]  PCP:  Street, Stephanie Coup, MD  Cardiologist:  Thomasene Ripple, DO  Electrophysiologist:  None   Referring MD: Street, Stephanie Coup, *   " I am doing well"  History of Present Illness:    Juan Payne is a 67 y.o. male with a hx of coronary artery disease status post CABG with LIMA to LAD, left radial artery to OM2 on May 14, 2019, hyperlipidemia, hypertension, postop A. fib  presents today for a follow up visit.   She reports no recent hospitalizations or changes in his health since his last visit in October 2023. He has not noticed any changes in his blood pressure on his current medications.  The patient reports ceasing his treadmill exercises due to knee arthritis, which has caused some swelling. 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 He also reports occasional lightheadedness when changing positions, such as getting up from a low position, but this does not last long.   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   LIMA to LAD LEFT RADIAL ARTERY to OM2    Unstable angina (HCC) 05/11/2019    Past Surgical History:  Procedure Laterality Date   CORONARY ARTERY BYPASS GRAFT N/A 05/14/2019   Procedure: CORONARY ARTERY BYPASS GRAFTING (CABG) x TWO USING LEFT INTERNAL MAMMARY ARTERY AND LEFT RADIAL ARTERY;  Surgeon: Kerin Perna, MD;  Location: [facility] OR;  Service: Open Heart Surgery;  Laterality: N/A;   LEFT HEART CATH AND CORONARY ANGIOGRAPHY N/A 05/13/2019   Procedure: LEFT  HEART CATH AND CORONARY ANGIOGRAPHY;  Surgeon: Runell Gess, MD;  Location: [facility];  Service: Cardiovascular;  Laterality: N/A;   RADIAL ARTERY HARVEST Left 05/14/2019   Procedure: RADIAL ARTERY HARVEST;  Surgeon: Kerin Perna, MD;  Location: [facility] OR;  Service: Open Heart Surgery;  Laterality: Left;   TEE WITHOUT CARDIOVERSION N/A 05/14/2019   Procedure: TRANSESOPHAGEAL ECHOCARDIOGRAM (TEE);  Surgeon: Donata Clay, Theron Arista, MD;  Location: [facility] OR;  Service: Open Heart Surgery;  Laterality: N/A;    Current Medications: Current Meds  Medication Sig   aspirin EC 81 MG tablet Take 81 mg by mouth daily. Swallow whole.   atorvastatin (LIPITOR) 40 MG tablet TAKE 1 TABLET BY MOUTH DAILY AT 6 PM.   carvedilol (COREG) 6.25 MG tablet Take 1 tablet (6.25 mg total) by mouth 2 (two) times daily.   citalopram (CELEXA) 20 MG tablet Take 20 mg by mouth at bedtime.    LORazepam (ATIVAN) 0.5 MG tablet Take 0.5 mg by mouth every 8 (eight) hours as needed for sleep.   nitroGLYCERIN (NITROSTAT) 0.4 MG SL tablet Place 1 tablet (0.4 mg total) under the tongue every 5 (five) minutes as needed for chest pain.   [DISCONTINUED] amLODipine (NORVASC) 5 MG tablet Take 1 tablet (5 mg total) by mouth daily.   [DISCONTINUED] ezetimibe (ZETIA) 10 MG tablet Take 1 tablet (10 mg total) by mouth daily.   [DISCONTINUED] losartan (COZAAR) 50 MG tablet TAKE 1  TABLET BY MOUTH IN THE MORNING THEN TAKE 1/2 TABLET IN THE EVENING.     Allergies:   Morphine and codeine   Social History   Socioeconomic History   Marital status: Single    Spouse name: Not on file   Number of children: Not on file   Years of education: Not on file   Highest education level: Not on file  Occupational History   Not on file  Tobacco Use   Smoking status: Never   Smokeless tobacco: Never  Substance and Sexual Activity   Alcohol use: Not Currently   Drug use: Never   Sexual activity: Not on file  Other Topics Concern   Not on file   Social History Narrative   ** Merged History Encounter **       Social Determinants of Health   Financial Resource Strain: Not on file  Food Insecurity: Not on file  Transportation Needs: Not on file  Physical Activity: Not on file  Stress: Not on file  Social Connections: Not on file     Family History: The patient's family history includes Heart attack in his brother and father; Hypertension in his mother.  ROS:   Review of Systems  Constitution: Negative for decreased appetite, fever and weight gain.  HENT: Negative for congestion, ear discharge, hoarse voice and sore throat.   Eyes: Negative for discharge, redness, vision loss in right eye and visual halos.  Cardiovascular: Negative for chest pain, dyspnea on exertion, leg swelling, orthopnea and palpitations.  Respiratory: Negative for cough, hemoptysis, shortness of breath and snoring.   Endocrine: Negative for heat intolerance and polyphagia.  Hematologic/Lymphatic: Negative for bleeding problem. Does not bruise/bleed easily.  Skin: Negative for flushing, nail changes, rash and suspicious lesions.  Musculoskeletal: Negative for arthritis, joint pain, muscle cramps, myalgias, neck pain and stiffness.  Gastrointestinal: Negative for abdominal pain, bowel incontinence, diarrhea and excessive appetite.  Genitourinary: Negative for decreased libido, genital sores and incomplete emptying.  Neurological: Negative for brief paralysis, focal weakness, headaches and loss of balance.  Psychiatric/Behavioral: Negative for altered mental status, depression and suicidal ideas.  Allergic/Immunologic: Negative for HIV exposure and persistent infections.    EKGs/Labs/Other Studies Reviewed:    The following studies were reviewed today:   EKG:  The ekg ordered today demonstrates sinus rhythm with sinus arrhythmia, heart rate 62 bpm.  Recent Labs: No results found for requested labs within last 365 days.  Recent Lipid Panel     Component Value Date/Time   CHOL 145 07/10/2020 0838   TRIG 69 07/10/2020 0838   HDL 65 07/10/2020 0838   CHOLHDL 2.2 07/10/2020 0838   CHOLHDL 3.7 05/12/2019 0216   VLDL 27 05/12/2019 0216   LDLCALC 66 07/10/2020 0838    Physical Exam:    VS:  BP 116/64 (BP Location: Right Arm, Patient Position: Sitting, Cuff Size: Normal)   Pulse 62   Ht 5\' 9"  (1.753 m)   Wt 177 lb (80.3 kg)   SpO2 97%   BMI 26.14 kg/m     Wt Readings from Last 3 Encounters:  10/27/22 177 lb (80.3 kg)  10/15/21 172 lb 3.2 oz (78.1 kg)  12/01/20 169 lb 6.4 oz (76.8 kg)     GEN: Well nourished, well developed in no acute distress HEENT: Normal NECK: No JVD; No carotid bruits LYMPHATICS: No lymphadenopathy CARDIAC: S1S2 noted,RRR, no murmurs, rubs, gallops RESPIRATORY:  Clear to auscultation without rales, wheezing or rhonchi  ABDOMEN: Soft, non-tender, non-distended, +bowel sounds, no  guarding. EXTREMITIES: No edema, No cyanosis, no clubbing MUSCULOSKELETAL:  No deformity  SKIN: Warm and dry NEUROLOGIC:  Alert and oriented x 3, non-focal PSYCHIATRIC:  Normal affect, good insight  ASSESSMENT:    1. Coronary artery disease involving native coronary artery of native heart without angina pectoris   2. S/P CABG x 2   3. PAF (paroxysmal atrial fibrillation) (HCC)   4. Mixed hyperlipidemia   5. Essential hypertension    PLAN:     Coronary Artery Disease Status post LAD and OM-2 stenting in May 2021. No reported chest pain or shortness of breath. LDL controlled at 67. -Continue current management and medications.  Hypertension No reported blood pressure changes on current medications. -Continue current management and medications.  Hyperlipidemia LDL controlled at 67. -Continue current management and medications.  Atrial Fibrillation No new symptoms reported. No recurrent episodes of the post operative atrial fibrillation. -Continue current management and  medications.  Lightheadedness Reports transient lightheadedness when changing positions, such as getting up from a low position. No prolonged episodes. -Monitor symptoms. If symptoms persist or worsen, consider referral to ENT for vestibular rehab.  Follow-up in 1 year.  The patient is in agreement with the above plan. The patient left the office in stable condition.  The patient will follow up in   Medication Adjustments/Labs and Tests Ordered: Current medicines are reviewed at length with the patient today.  Concerns regarding medicines are outlined above.  Orders Placed This Encounter  Procedures   EKG 12-Lead   Meds ordered this encounter  Medications   losartan (COZAAR) 50 MG tablet    Sig: TAKE 1 TABLET BY MOUTH IN THE MORNING THEN TAKE 1/2 TABLET IN THE EVENING.    Dispense:  135 tablet    Refill:  3   ezetimibe (ZETIA) 10 MG tablet    Sig: Take 1 tablet (10 mg total) by mouth daily.    Dispense:  90 tablet    Refill:  3   amLODipine (NORVASC) 5 MG tablet    Sig: Take 1 tablet (5 mg total) by mouth daily.    Dispense:  90 tablet    Refill:  3    Patient Instructions  Medication Instructions:  NO CHANGES  *If you need a refill on your cardiac medications before your next appointment, please call your pharmacy*    Follow-Up: At [facility], you and your health needs are our priority.  As part of our continuing mission to provide you with exceptional heart care, we have created designated Provider Care Teams.  These Care Teams include your primary Cardiologist (physician) and Advanced Practice Providers (APPs -  Physician Assistants and Nurse Practitioners) who all work together to provide you with the care you need, when you need it.  We recommend signing up for the patient portal called "MyChart".  Sign up information is provided on this After Visit Summary.  MyChart is used to connect with patients for Virtual Visits (Telemedicine).  Patients are able to  view lab/test results, encounter notes, upcoming appointments, etc.  Non-urgent messages can be sent to your provider as well.   To learn more about what you can do with MyChart, go to [email or web address].    Your next appointment:   12 months with Dr. Servando Salina    Adopting a Healthy Lifestyle.  Know what a healthy weight is for you (roughly BMI <25) and aim to maintain this   Aim for 7+ servings of fruits and vegetables daily   65-80+ fluid  ounces of water or unsweet tea for healthy kidneys   Limit to max 1 drink of alcohol per day; avoid smoking/tobacco   Limit animal fats in diet for cholesterol and heart health - choose grass fed whenever available   Avoid highly processed foods, and foods high in saturated/trans fats   Aim for low stress - take time to unwind and care for your mental health   Aim for 150 min of moderate intensity exercise weekly for heart health, and weights twice weekly for bone health   Aim for 7-9 hours of sleep daily   When it comes to diets, agreement about the perfect plan isnt easy to find, even among the experts. Experts at the [facility] [employer] developed an idea known as the Healthy Eating Plate. Just imagine a plate divided into logical, healthy portions.   The emphasis is on diet quality:   Load up on vegetables and fruits - one-half of your plate: Aim for color and variety, and remember that potatoes dont count.   Go for whole grains - one-quarter of your plate: Whole wheat, barley, wheat berries, quinoa, oats, brown rice, and foods made with them. If you want pasta, go with whole wheat pasta.   Protein power - one-quarter of your plate: Fish, chicken, beans, and nuts are all healthy, versatile protein sources. Limit red meat.   The diet, however, does go beyond the plate, offering a few other suggestions.   Use healthy plant oils, such as olive, canola, soy, corn, sunflower and peanut. Check the labels, and avoid partially  hydrogenated oil, which have unhealthy trans fats.   If youre thirsty, drink water. Coffee and tea are good in moderation, but skip sugary drinks and limit milk and dairy products to one or two daily servings.   The type of carbohydrate in the diet is more important than the amount. Some sources of carbohydrates, such as vegetables, fruits, whole grains, and beans-are healthier than others.   Finally, stay active  Signed, Thomasene Ripple, DO  10/28/2022 10:53 PM    [facility]

## 2022-11-17 DIAGNOSIS — I10 Essential (primary) hypertension: Secondary | ICD-10-CM | POA: Diagnosis not present

## 2022-11-17 DIAGNOSIS — F419 Anxiety disorder, unspecified: Secondary | ICD-10-CM | POA: Diagnosis not present

## 2022-11-17 DIAGNOSIS — I2510 Atherosclerotic heart disease of native coronary artery without angina pectoris: Secondary | ICD-10-CM | POA: Diagnosis not present

## 2022-11-18 ENCOUNTER — Telehealth: Payer: Self-pay | Admitting: *Deleted

## 2022-11-18 NOTE — Telephone Encounter (Signed)
Attempt to reach pt for pre-visit. LM with call back #.   Will attempt other number in profile. Attempt to reach pt for pre-visit. LM with call back #.   Will attempt other number in profile  Second attempt to reach pt for pre-vist unsuccessful. LM with facility # for pt to call back. Instructed pt to call # given by end of the day and reschedule the pre-visit  with RN or the scheduled procedure will be canceled.

## 2022-11-21 ENCOUNTER — Ambulatory Visit (AMBULATORY_SURGERY_CENTER): Payer: Medicare Other | Admitting: *Deleted

## 2022-11-21 VITALS — Ht 68.0 in | Wt 170.0 lb

## 2022-11-21 DIAGNOSIS — Z1211 Encounter for screening for malignant neoplasm of colon: Secondary | ICD-10-CM

## 2022-11-21 MED ORDER — NA SULFATE-K SULFATE-MG SULF 17.5-3.13-1.6 GM/177ML PO SOLN: 1.0000 | Freq: Once | ORAL | 0 refills | Status: AC

## 2022-11-21 NOTE — Progress Notes (Signed)
Pt states he hasn't taken nitroglycerin in more than a year  Pt's previsit is done over the phone and all paperwork (prep instructions) sent to patient.  Pt's name and DOB verified at the beginning of the previsit.  Pt denies any difficulty with ambulating.   No egg or soy allergy known to patient  No issues known to pt with past sedation with any surgeries or procedures Patient denies ever being told they had issues or difficulty with intubation  No FH of Malignant Hyperthermia Pt is not on diet pills Pt is not on  home 02  Pt is not on blood thinners  Pt denies issues with constipation  Pt is not on dialysis Pt denies any upcoming cardiac testing Pt encouraged to use to use Singlecare or Goodrx to reduce cost

## 2022-11-24 ENCOUNTER — Telehealth: Payer: Self-pay | Admitting: *Deleted

## 2022-11-24 MED ORDER — NA SULFATE-K SULFATE-MG SULF 17.5-3.13-1.6 GM/177ML PO SOLN: 1.0000 | Freq: Once | ORAL | 0 refills | Status: AC

## 2022-11-24 NOTE — Telephone Encounter (Signed)
Pt is asking prep be sent to a different pharmacy d/t cost Suprep sent to [facility] in [place]

## 2022-11-25 ENCOUNTER — Encounter: Payer: Self-pay | Admitting: Gastroenterology

## 2022-12-06 ENCOUNTER — Encounter: Payer: Medicare Other | Admitting: Gastroenterology

## 2022-12-13 DIAGNOSIS — G4733 Obstructive sleep apnea (adult) (pediatric): Secondary | ICD-10-CM | POA: Diagnosis not present

## 2022-12-14 ENCOUNTER — Ambulatory Visit: Payer: Medicare Other | Admitting: Gastroenterology

## 2022-12-14 ENCOUNTER — Encounter: Payer: Self-pay | Admitting: Gastroenterology

## 2022-12-14 VITALS — BP 136/88 | HR 67 | Temp 98.1°F | Resp 11 | Ht 68.0 in | Wt 170.0 lb

## 2022-12-14 DIAGNOSIS — Z1211 Encounter for screening for malignant neoplasm of colon: Secondary | ICD-10-CM | POA: Diagnosis not present

## 2022-12-14 DIAGNOSIS — D124 Benign neoplasm of descending colon: Secondary | ICD-10-CM

## 2022-12-14 DIAGNOSIS — K5730 Diverticulosis of large intestine without perforation or abscess without bleeding: Secondary | ICD-10-CM | POA: Diagnosis not present

## 2022-12-14 DIAGNOSIS — D120 Benign neoplasm of cecum: Secondary | ICD-10-CM | POA: Diagnosis not present

## 2022-12-14 DIAGNOSIS — D123 Benign neoplasm of transverse colon: Secondary | ICD-10-CM | POA: Diagnosis not present

## 2022-12-14 DIAGNOSIS — K648 Other hemorrhoids: Secondary | ICD-10-CM

## 2022-12-14 DIAGNOSIS — D125 Benign neoplasm of sigmoid colon: Secondary | ICD-10-CM | POA: Diagnosis not present

## 2022-12-14 DIAGNOSIS — K641 Second degree hemorrhoids: Secondary | ICD-10-CM

## 2022-12-14 MED ORDER — SODIUM CHLORIDE 0.9 % IV SOLN: 500.0000 mL | INTRAVENOUS | Status: DC

## 2022-12-14 NOTE — Progress Notes (Signed)
Report given to PACU, vss 

## 2022-12-14 NOTE — Progress Notes (Signed)
GASTROENTEROLOGY PROCEDURE H&P NOTE   Primary Care Physician: Street, Stephanie Coup, MD    Reason for Procedure:  Colon Cancer screening  Plan:    Colonoscopy  Patient is appropriate for endoscopic procedure(s) in the ambulatory ([facility]) setting.  The nature of the procedure, as well as the risks, benefits, and alternatives were carefully and thoroughly reviewed with the patient. Ample time for discussion and questions allowed. The patient understood, was satisfied, and agreed to proceed.     HPI: Juan Payne is a 67 y.o. male who presents for colonoscopy for routine Colon Cancer screening.  No active GI symptoms.  No known family history of colon cancer or related malignancy.  Patient is otherwise without complaints or active issues today.  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   LIMA to LAD LEFT RADIAL ARTERY to OM2    Sleep apnea    wear CPAP   Unstable angina (HCC) 05/11/2019    Past Surgical History:  Procedure Laterality Date   CORONARY ARTERY BYPASS GRAFT N/A 05/14/2019   Procedure: CORONARY ARTERY BYPASS GRAFTING (CABG) x TWO USING LEFT INTERNAL MAMMARY ARTERY AND LEFT RADIAL ARTERY;  Surgeon: Kerin Perna, MD;  Location: [facility] OR;  Service: Open Heart Surgery;  Laterality: N/A;   LEFT HEART CATH AND CORONARY ANGIOGRAPHY N/A 05/13/2019   Procedure: LEFT HEART CATH AND CORONARY ANGIOGRAPHY;  Surgeon: Runell Gess, MD;  Location: [facility];  Service: Cardiovascular;  Laterality: N/A;   RADIAL ARTERY HARVEST Left 05/14/2019   Procedure:  RADIAL ARTERY HARVEST;  Surgeon: Kerin Perna, MD;  Location: [facility] OR;  Service: Open Heart Surgery;  Laterality: Left;   TEE WITHOUT CARDIOVERSION N/A 05/14/2019   Procedure: TRANSESOPHAGEAL ECHOCARDIOGRAM (TEE);  Surgeon: Donata Clay, Theron Arista, MD;  Location: [facility] OR;  Service: Open Heart Surgery;  Laterality: N/A;    Prior to Admission medications   Medication Sig Start Date End Date Taking? Authorizing Provider  amLODipine (NORVASC) 5 MG tablet Take 1 tablet (5 mg total) by mouth daily. 10/27/22   Tobb, Kardie, DO  aspirin EC 81 MG tablet Take 81 mg by mouth daily. Swallow whole.    [provider]  atorvastatin (LIPITOR) 40 MG tablet TAKE 1 TABLET BY MOUTH DAILY AT 6 PM. 10/15/21   Tobb, Kardie, DO  carvedilol (COREG) 6.25 MG tablet Take 1 tablet (6.25 mg total) by mouth 2 (two) times daily. 10/15/21   Tobb, Kardie, DO  citalopram (CELEXA) 20 MG tablet Take 20 mg by mouth at bedtime.  05/03/19   [provider]  ezetimibe (ZETIA) 10 MG tablet Take 1 tablet (10 mg total) by mouth daily. 10/27/22   Tobb, Kardie, DO  LORazepam (ATIVAN) 0.5 MG tablet Take 0.5 mg by mouth every 8 (eight) hours as needed for sleep.    [provider]  losartan (COZAAR) 50 MG tablet TAKE 1 TABLET BY MOUTH IN THE MORNING THEN TAKE 1/2 TABLET IN THE EVENING. 10/27/22   Tobb, Kardie, DO  nitroGLYCERIN (NITROSTAT) 0.4 MG SL tablet Place 1 tablet (0.4 mg  total) under the tongue every 5 (five) minutes as needed for chest pain. Patient not taking: Reported on 11/21/2022 08/07/20   Cyndi Bender, NP    Current Outpatient Medications  Medication Sig Dispense Refill   amLODipine (NORVASC) 5 MG tablet Take 1 tablet (5 mg total) by mouth daily. 90 tablet 3   aspirin EC 81 MG tablet Take 81 mg by mouth daily. Swallow whole.     atorvastatin (LIPITOR) 40 MG tablet TAKE 1 TABLET BY MOUTH DAILY AT 6 PM. 90 tablet 3   carvedilol (COREG) 6.25 MG tablet Take 1 tablet (6.25 mg total) by mouth 2 (two) times daily. 180  tablet 3   citalopram (CELEXA) 20 MG tablet Take 20 mg by mouth at bedtime.      ezetimibe (ZETIA) 10 MG tablet Take 1 tablet (10 mg total) by mouth daily. 90 tablet 3   LORazepam (ATIVAN) 0.5 MG tablet Take 0.5 mg by mouth every 8 (eight) hours as needed for sleep.     losartan (COZAAR) 50 MG tablet TAKE 1 TABLET BY MOUTH IN THE MORNING THEN TAKE 1/2 TABLET IN THE EVENING. 135 tablet 3   nitroGLYCERIN (NITROSTAT) 0.4 MG SL tablet Place 1 tablet (0.4 mg total) under the tongue every 5 (five) minutes as needed for chest pain. (Patient not taking: Reported on 11/21/2022) 20 tablet 1   No current facility-administered medications for this visit.    Allergies as of 12/14/2022 - Review Complete 12/14/2022  Allergen Reaction Noted   Morphine and codeine Other (See Comments) 05/11/2019    Family History  Problem Relation Age of Onset   Hypertension Mother    Heart attack Father    Heart attack Brother    Colon cancer Neg Hx    Esophageal cancer Neg Hx    Rectal cancer Neg Hx    Stomach cancer Neg Hx     Social History   Socioeconomic History   Marital status: Single    Spouse name: Not on file   Number of children: Not on file   Years of education: Not on file   Highest education level: Not on file  Occupational History   Not on file  Tobacco Use   Smoking status: Never   Smokeless tobacco: Never  Vaping Use   Vaping status: Never Used  Substance and Sexual Activity   Alcohol use: Not Currently   Drug use: Never   Sexual activity: Not on file  Other Topics Concern   Not on file  Social History Narrative   ** Merged History Encounter **       Social Determinants of Health   Financial Resource Strain: Not on file  Food Insecurity: Not on file  Transportation Needs: Not on file  Physical Activity: Not on file  Stress: Not on file  Social Connections: Not on file  Intimate Partner Violence: Not on file    Physical Exam: Vital signs in last 24 hours: @There  were no  vitals taken for this visit. GEN: NAD EYE: Sclerae anicteric ENT: MMM CV: Non-tachycardic Pulm: CTA b/l GI: Soft, NT/ND NEURO:  Alert & Oriented x 3   Doristine Locks, DO [facility]   12/14/2022 12:28 PM

## 2022-12-14 NOTE — Op Note (Signed)
[facility] Patient Name: Juan Payne Procedure Date: 12/14/2022 12:55 PM MRN: [id number] Endoscopist: Doristine Locks , MD, [id number] Age: 67 Referring MD:  Date of Birth: Jul 07, 1955 Gender: Male Account #: [id number] Procedure:                Colonoscopy Indications:              Screening for colorectal malignant neoplasm, This                            is the patient's first colonoscopy Medicines:                Monitored Anesthesia Care Procedure:                Pre-Anesthesia Assessment:                           - Prior to the procedure, a History and Physical                            was performed, and patient medications and                            allergies were reviewed. The patient's tolerance of                            previous anesthesia was also reviewed. The risks                            and benefits of the procedure and the sedation                            options and risks were discussed with the patient.                            All questions were answered, and informed consent                            was obtained. Prior Anticoagulants: The patient has                            taken no anticoagulant or antiplatelet agents. ASA                            Grade Assessment: II - A patient with mild systemic                            disease. After reviewing the risks and benefits,                            the patient was deemed in satisfactory condition to                            undergo the procedure.  After obtaining informed consent, the colonoscope                            was passed under direct vision. Throughout the                            procedure, the patient's blood pressure, pulse, and                            oxygen saturations were monitored continuously. The                            Olympus Scope SN [id number] was introduced through the                            anus and advanced  to the the terminal ileum. The                            colonoscopy was performed without difficulty. The                            patient tolerated the procedure well. The quality                            of the bowel preparation was good. The terminal                            ileum, ileocecal valve, appendiceal orifice, and                            rectum were photographed. Scope In: 1:01:21 PM Scope Out: 1:25:10 PM Scope Withdrawal Time: 0 hours 20 minutes 38 seconds  Total Procedure Duration: 0 hours 23 minutes 49 seconds  Findings:                 The perianal and digital rectal examinations were                            normal.                           Four sessile polyps were found in the transverse                            colon and cecum. The polyps were 3 to 6 mm in size.                            These polyps were removed with a cold snare.                            Resection and retrieval were complete. Estimated                            blood loss was minimal.  Four sessile polyps were found in the sigmoid colon                            and descending colon. The polyps were 3 to 5 mm in                            size. These polyps were removed with a cold snare.                            Resection and retrieval were complete. Estimated                            blood loss was minimal.                           Multiple medium-mouthed and small-mouthed                            diverticula were found in the sigmoid colon.                           Non-bleeding internal hemorrhoids were found during                            retroflexion. The hemorrhoids were small.                           The terminal ileum appeared normal. Complications:            No immediate complications. Estimated Blood Loss:     Estimated blood loss was minimal. Impression:               - Four 3 to 6 mm polyps in the transverse colon and                             in the cecum, removed with a cold snare. Resected                            and retrieved.                           - Four 3 to 5 mm polyps in the sigmoid colon and in                            the descending colon, removed with a cold snare.                            Resected and retrieved.                           - Diverticulosis in the sigmoid colon.                           - Non-bleeding internal hemorrhoids.                           -  The examined portion of the ileum was normal. Recommendation:           - Patient has a contact number available for                            emergencies. The signs and symptoms of potential                            delayed complications were discussed with the                            patient. Return to normal activities tomorrow.                            Written discharge instructions were provided to the                            patient.                           - Resume previous diet.                           - Continue present medications.                           - Await pathology results.                           - Repeat colonoscopy for surveillance based on                            pathology results.                           - Return to GI office PRN. Doristine Locks, MD 12/14/2022 1:30:18 PM

## 2022-12-14 NOTE — Progress Notes (Signed)
Called to room to assist during endoscopic procedure.  Patient ID and intended procedure confirmed with present staff. Received instructions for my participation in the procedure from the performing physician.  

## 2022-12-14 NOTE — Patient Instructions (Signed)
Resume previous diet and medications. Awaiting pathology results. Repeat Colonoscopy date to be determined based on pathology results. Handouts provided on Colon polyps, Diverticulosis and Hemorrhoids   YOU HAD AN ENDOSCOPIC PROCEDURE TODAY AT [facility]:   Refer to the procedure report that was given to you for any specific questions about what was found during the examination.  If the procedure report does not answer your questions, please call your gastroenterologist to clarify.  If you requested that your care partner not be given the details of your procedure findings, then the procedure report has been included in a sealed envelope for you to review at your convenience later.  YOU SHOULD EXPECT: Some feelings of bloating in the abdomen. Passage of more gas than usual.  Walking can help get rid of the air that was put into your GI tract during the procedure and reduce the bloating. If you had a lower endoscopy (such as a colonoscopy or flexible sigmoidoscopy) you may notice spotting of blood in your stool or on the toilet paper. If you underwent a bowel prep for your procedure, you may not have a normal bowel movement for a few days.  Please Note:  You might notice some irritation and congestion in your nose or some drainage.  This is from the oxygen used during your procedure.  There is no need for concern and it should clear up in a day or so.  SYMPTOMS TO REPORT IMMEDIATELY:  Following lower endoscopy (colonoscopy or flexible sigmoidoscopy):  Excessive amounts of blood in the stool  Significant tenderness or worsening of abdominal pains  Swelling of the abdomen that is new, acute  Fever of 100F or higher  For urgent or emergent issues, a gastroenterologist can be reached at any hour by calling [phone]. Do not use MyChart messaging for urgent concerns.    DIET:  We do recommend a small meal at first, but then you may proceed to your regular diet.  Drink plenty of  fluids but you should avoid alcoholic beverages for 24 hours.  ACTIVITY:  You should plan to take it easy for the rest of today and you should NOT DRIVE or use heavy machinery until tomorrow (because of the sedation medicines used during the test).    FOLLOW UP: Our staff will call the number listed on your records the next business day following your procedure.  We will call around 7:15- 8:00 am to check on you and address any questions or concerns that you may have regarding the information given to you following your procedure. If we do not reach you, we will leave a message.     If any biopsies were taken you will be contacted by phone or by letter within the next 1-3 weeks.  Please call us at [phone] if you have not heard about the biopsies in 3 weeks.    SIGNATURES/CONFIDENTIALITY: You and/or your care partner have signed paperwork which will be entered into your electronic medical record.  These signatures attest to the fact that that the information above on your After Visit Summary has been reviewed and is understood.  Full responsibility of the confidentiality of this discharge information lies with you and/or your care-partner.

## 2022-12-14 NOTE — Progress Notes (Signed)
Pt's states no medical or surgical changes since previsit or office visit. 

## 2022-12-15 ENCOUNTER — Telehealth: Payer: Self-pay

## 2022-12-15 NOTE — Telephone Encounter (Signed)
  Follow up Call-     12/14/2022   12:37 PM  Call back number  Post procedure Call Back phone  # [phone]  Permission to leave phone message Yes     Patient questions:  Do you have a fever, pain , or abdominal swelling? No. Pain Score  0 *  Have you tolerated food without any problems? Yes.    Have you been able to return to your normal activities? Yes.    Do you have any questions about your discharge instructions: Diet   No. Medications  No. Follow up visit  No.  Do you have questions or concerns about your Care? No.  Actions: * If pain score is 4 or above: No action needed, pain <4.

## 2022-12-19 LAB — SURGICAL PATHOLOGY

## 2022-12-26 ENCOUNTER — Encounter: Payer: Medicare Other | Admitting: Gastroenterology

## 2023-03-13 DIAGNOSIS — G4733 Obstructive sleep apnea (adult) (pediatric): Secondary | ICD-10-CM | POA: Diagnosis not present

## 2023-06-09 DIAGNOSIS — D6869 Other thrombophilia: Secondary | ICD-10-CM | POA: Diagnosis not present

## 2023-07-13 DIAGNOSIS — G4733 Obstructive sleep apnea (adult) (pediatric): Secondary | ICD-10-CM | POA: Diagnosis not present

## 2023-10-24 ENCOUNTER — Telehealth: Payer: Self-pay | Admitting: Cardiology

## 2023-10-24 MED ORDER — LOSARTAN POTASSIUM 50 MG PO TABS: ORAL_TABLET | ORAL | 0 refills | Status: AC

## 2023-10-24 MED ORDER — EZETIMIBE 10 MG PO TABS: 10.0000 mg | ORAL_TABLET | Freq: Every day | ORAL | 0 refills | Status: AC

## 2023-10-24 MED ORDER — AMLODIPINE BESYLATE 5 MG PO TABS: 5.0000 mg | ORAL_TABLET | Freq: Every day | ORAL | 0 refills | Status: AC

## 2023-10-24 NOTE — Telephone Encounter (Signed)
 RX sent in

## 2023-10-24 NOTE — Telephone Encounter (Signed)
*  STAT* If patient is at the pharmacy, call can be transferred to refill team.   1. Which medications need to be refilled? (please list name of each medication and dose if known)   losartan  (COZAAR ) 50 MG tablet   ezetimibe  (ZETIA ) 10 MG tablet    amLODipine  (NORVASC ) 5 MG tablet     4. Which pharmacy/location (including street and city if local pharmacy) is medication to be sent to?  [facility] - [place], [place] - [address]     5. Do they need a 30 day or 90 day supply? 90    Scheduled 10/31

## 2023-11-09 DIAGNOSIS — Z9181 History of falling: Secondary | ICD-10-CM | POA: Diagnosis not present

## 2023-11-09 DIAGNOSIS — Z131 Encounter for screening for diabetes mellitus: Secondary | ICD-10-CM | POA: Diagnosis not present

## 2023-11-09 DIAGNOSIS — E785 Hyperlipidemia, unspecified: Secondary | ICD-10-CM | POA: Diagnosis not present

## 2023-11-09 DIAGNOSIS — Z23 Encounter for immunization: Secondary | ICD-10-CM | POA: Diagnosis not present

## 2023-11-09 DIAGNOSIS — Z0000 Encounter for general adult medical examination without abnormal findings: Secondary | ICD-10-CM | POA: Diagnosis not present

## 2023-11-09 DIAGNOSIS — Z1331 Encounter for screening for depression: Secondary | ICD-10-CM | POA: Diagnosis not present

## 2023-11-09 DIAGNOSIS — Z125 Encounter for screening for malignant neoplasm of prostate: Secondary | ICD-10-CM | POA: Diagnosis not present

## 2023-11-10 ENCOUNTER — Encounter: Payer: Self-pay | Admitting: Cardiology

## 2023-11-10 ENCOUNTER — Ambulatory Visit: Attending: Cardiology | Admitting: Cardiology

## 2023-11-10 VITALS — BP 134/86 | HR 58 | Ht 68.0 in | Wt 186.8 lb

## 2023-11-10 DIAGNOSIS — I10 Essential (primary) hypertension: Secondary | ICD-10-CM | POA: Diagnosis not present

## 2023-11-10 DIAGNOSIS — I2510 Atherosclerotic heart disease of native coronary artery without angina pectoris: Secondary | ICD-10-CM

## 2023-11-10 DIAGNOSIS — E782 Mixed hyperlipidemia: Secondary | ICD-10-CM

## 2023-11-10 DIAGNOSIS — I480 Paroxysmal atrial fibrillation: Secondary | ICD-10-CM

## 2023-11-10 NOTE — Patient Instructions (Signed)

## 2023-11-10 NOTE — Progress Notes (Signed)
 Cardiology Office Note:    Date:  11/10/2023   ID:  Juan Payne, DOB 1955-02-04, MRN [id number]  PCP:  Street, Lonni HERO, MD  Cardiologist:  Dub Huntsman, DO  Electrophysiologist:  None   Referring MD: Street, Lonni HERO, *    I am doing well  History of Present Illness:    Juan Payne is a 68 y.o. male with a hx of coronary artery disease status post CABG with LIMA to LAD, left radial artery to OM2 on May 14, 2019, hyperlipidemia, hypertension, postop A. fib  presents today for a follow up visit.   He denies any complaint of chest pain or shortness of breath.  He has not had any hospitalizations.  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  He did have a visit with his PCP yesterday.  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   LIMA to LAD LEFT RADIAL ARTERY to OM2    Sleep apnea    wear CPAP   Unstable angina (HCC) 05/11/2019    Past Surgical History:  Procedure Laterality Date   CORONARY ARTERY BYPASS GRAFT N/A 05/14/2019   Procedure: CORONARY ARTERY BYPASS GRAFTING (CABG) x TWO USING LEFT INTERNAL MAMMARY ARTERY AND LEFT RADIAL ARTERY;  Surgeon: Fleeta Hanford Coy, MD;  Location: [facility] OR;  Service: Open Heart Surgery;  Laterality: N/A;   LEFT HEART CATH AND CORONARY ANGIOGRAPHY N/A 05/13/2019   Procedure: LEFT HEART CATH AND CORONARY ANGIOGRAPHY;  Surgeon: Court Dorn PARAS, MD;  Location: [facility] INVASIVE CV LAB;  Service: Cardiovascular;  Laterality: N/A;   RADIAL ARTERY HARVEST Left 05/14/2019   Procedure: RADIAL  ARTERY HARVEST;  Surgeon: Fleeta Hanford Coy, MD;  Location: [facility] OR;  Service: Open Heart Surgery;  Laterality: Left;   TEE WITHOUT CARDIOVERSION N/A 05/14/2019   Procedure: TRANSESOPHAGEAL ECHOCARDIOGRAM (TEE);  Surgeon: Fleeta Hanford, Coy, MD;  Location: [facility] OR;  Service: Open Heart Surgery;  Laterality: N/A;    Current Medications: Current Meds  Medication Sig   amLODipine  (NORVASC ) 5 MG tablet Take 1 tablet (5 mg total) by mouth daily.   aspirin  EC 81 MG tablet Take 81 mg by mouth daily. Swallow whole.   atorvastatin  (LIPITOR) 40 MG tablet TAKE 1 TABLET BY MOUTH DAILY AT 6 PM.   carvedilol  (COREG ) 6.25 MG tablet Take 1 tablet (6.25 mg total) by mouth 2 (two) times daily.   citalopram  (CELEXA ) 20 MG tablet Take 20 mg by mouth at bedtime.    ezetimibe  (ZETIA ) 10 MG tablet Take 1 tablet (10 mg total) by mouth daily.   LORazepam (ATIVAN) 0.5 MG tablet Take 0.5 mg by mouth every 8 (eight) hours as needed for sleep.   losartan  (COZAAR ) 50 MG tablet TAKE 1 TABLET BY MOUTH IN THE MORNING THEN TAKE 1/2 TABLET IN THE EVENING.   nitroGLYCERIN  (NITROSTAT ) 0.4 MG SL tablet Place 1 tablet (0.4 mg total) under the tongue every 5 (five) minutes as needed for chest pain.     Allergies:   Morphine and codeine  Social History   Socioeconomic History   Marital status: Single    Spouse name: Not on file   Number of children: Not on file   Years of education: Not on file   Highest education level: Not on file  Occupational History   Not on file  Tobacco Use   Smoking status: Never   Smokeless tobacco: Never  Vaping Use   Vaping status: Never Used  Substance and Sexual Activity   Alcohol use: Not Currently   Drug use: Never   Sexual activity: Not on file  Other Topics Concern   Not on file  Social History Narrative   ** Merged History Encounter **       Social Drivers of Health   Financial Resource Strain: Not on file  Food Insecurity: Not on file  Transportation Needs: Not on file  Physical  Activity: Not on file  Stress: Not on file  Social Connections: Not on file     Family History: The patient's family history includes Heart attack in his brother and father; Hypertension in his mother. There is no history of Colon cancer, Esophageal cancer, Rectal cancer, or Stomach cancer.  ROS:   Review of Systems  Constitution: Negative for decreased appetite, fever and weight gain.  HENT: Negative for congestion, ear discharge, hoarse voice and sore throat.   Eyes: Negative for discharge, redness, vision loss in right eye and visual halos.  Cardiovascular: Negative for chest pain, dyspnea on exertion, leg swelling, orthopnea and palpitations.  Respiratory: Negative for cough, hemoptysis, shortness of breath and snoring.   Endocrine: Negative for heat intolerance and polyphagia.  Hematologic/Lymphatic: Negative for bleeding problem. Does not bruise/bleed easily.  Skin: Negative for flushing, nail changes, rash and suspicious lesions.  Musculoskeletal: Negative for arthritis, joint pain, muscle cramps, myalgias, neck pain and stiffness.  Gastrointestinal: Negative for abdominal pain, bowel incontinence, diarrhea and excessive appetite.  Genitourinary: Negative for decreased libido, genital sores and incomplete emptying.  Neurological: Negative for brief paralysis, focal weakness, headaches and loss of balance.  Psychiatric/Behavioral: Negative for altered mental status, depression and suicidal ideas.  Allergic/Immunologic: Negative for HIV exposure and persistent infections.    EKGs/Labs/Other Studies Reviewed:    The following studies were reviewed today:   EKG:  The ekg ordered today demonstrates sinus bradycardia with sinus arrhythmia, heart rate 58 bpm.  Recent Labs: No results found for requested labs within last 365 days.  Recent Lipid Panel    Component Value Date/Time   CHOL 145 07/10/2020 0838   TRIG 69 07/10/2020 0838   HDL 65 07/10/2020 0838   CHOLHDL 2.2  07/10/2020 0838   CHOLHDL 3.7 05/12/2019 0216   VLDL 27 05/12/2019 0216   LDLCALC 66 07/10/2020 0838    Physical Exam:    VS:  BP 134/86 (BP Location: Right Arm, Patient Position: Sitting, Cuff Size: Normal)   Pulse (!) 58   Ht 5' 8 (1.727 m)   Wt 186 lb 12.8 oz (84.7 kg)   SpO2 95%   BMI 28.40 kg/m     Wt Readings from Last 3 Encounters:  11/10/23 186 lb 12.8 oz (84.7 kg)  12/14/22 170 lb (77.1 kg)  11/21/22 170 lb (77.1 kg)     GEN: Well nourished, well developed in no acute distress HEENT: Normal NECK: No JVD; No carotid bruits LYMPHATICS: No lymphadenopathy CARDIAC: S1S2 noted,RRR, no murmurs, rubs, gallops RESPIRATORY:  Clear to auscultation without rales, wheezing or rhonchi  ABDOMEN: Soft, non-tender, non-distended, +bowel sounds, no guarding.  EXTREMITIES: No edema, No cyanosis, no clubbing MUSCULOSKELETAL:  No deformity  SKIN: Warm and dry NEUROLOGIC:  Alert and oriented x 3, non-focal PSYCHIATRIC:  Normal affect, good insight  ASSESSMENT:    1. PAF (paroxysmal atrial fibrillation) (HCC)   2. Essential hypertension   3. Coronary artery disease involving native coronary artery of native heart without angina pectoris   4. Mixed hyperlipidemia    PLAN:    Coronary Artery Disease - Status post LAD and OM-2 stenting in May 2021. No reported chest pain or shortness of breath.  Had labs yesterday with his PCP.  Pending -Continue current management and medications.  Hypertension - Continue current management and medications.  Hyperlipidemia-continue current regimen.  Paroxysmal atrial fibrillation thought to be postop A-fib and has not had any recurrence No new symptoms reported. No recurrent episodes of the post operative atrial fibrillation. -Continue current management and medications.   Follow-up in 1 year.  The patient is in agreement with the above plan. The patient left the office in stable condition.  The patient will follow up in   Medication  Adjustments/Labs and Tests Ordered: Current medicines are reviewed at length with the patient today.  Concerns regarding medicines are outlined above.  Orders Placed This Encounter  Procedures   EKG 12-Lead   No orders of the defined types were placed in this encounter.   Patient Instructions  Medication Instructions:  Your physician recommends that you continue on your current medications as directed. Please refer to the Current Medication list given to you today.  *If you need a refill on your cardiac medications before your next appointment, please call your pharmacy*   Follow-Up: At [facility], you and your health needs are our priority.  As part of our continuing mission to provide you with exceptional heart care, our providers are all part of one team.  This team includes your primary Cardiologist (physician) and Advanced Practice Providers or APPs (Physician Assistants and Nurse Practitioners) who all work together to provide you with the care you need, when you need it.  Your next appointment:   1 year(s)  Provider:   Pressley Barsky, DO        Adopting a Healthy Lifestyle.  Know what a healthy weight is for you (roughly BMI <25) and aim to maintain this   Aim for 7+ servings of fruits and vegetables daily   65-80+ fluid ounces of water or unsweet tea for healthy kidneys   Limit to max 1 drink of alcohol per day; avoid smoking/tobacco   Limit animal fats in diet for cholesterol and heart health - choose grass fed whenever available   Avoid highly processed foods, and foods high in saturated/trans fats   Aim for low stress - take time to unwind and care for your mental health   Aim for 150 min of moderate intensity exercise weekly for heart health, and weights twice weekly for bone health   Aim for 7-9 hours of sleep daily   When it comes to diets, agreement about the perfect plan isnt easy to find, even among the experts. Experts at the [facility] of  [employer] developed an idea known as the Healthy Eating Plate. Just imagine a plate divided into logical, healthy portions.   The emphasis is on diet quality:   Load up on vegetables and fruits - one-half of your plate: Aim for color and variety, and remember that potatoes dont count.   Go for whole grains - one-quarter of  your plate: Whole wheat, barley, wheat berries, quinoa, oats, brown rice, and foods made with them. If you want pasta, go with whole wheat pasta.   Protein power - one-quarter of your plate: Fish, chicken, beans, and nuts are all healthy, versatile protein sources. Limit red meat.   The diet, however, does go beyond the plate, offering a few other suggestions.   Use healthy plant oils, such as olive, canola, soy, corn, sunflower and peanut. Check the labels, and avoid partially hydrogenated oil, which have unhealthy trans fats.   If youre thirsty, drink water. Coffee and tea are good in moderation, but skip sugary drinks and limit milk and dairy products to one or two daily servings.   The type of carbohydrate in the diet is more important than the amount. Some sources of carbohydrates, such as vegetables, fruits, whole grains, and beans-are healthier than others.   Finally, stay active  Signed, Dub Huntsman, DO  11/10/2023 10:53 AM     [facility]
